# Patient Record
Sex: Male | Born: 1935 | Race: White | Hispanic: No | Marital: Married | Smoking: Former smoker
Health system: Southern US, Community
[De-identification: ages and names within clinical notes are randomized; demographics above are authoritative.]

## PROBLEM LIST (undated history)

## (undated) DIAGNOSIS — N400 Enlarged prostate without lower urinary tract symptoms: Secondary | ICD-10-CM

## (undated) DIAGNOSIS — K219 Gastro-esophageal reflux disease without esophagitis: Secondary | ICD-10-CM

## (undated) DIAGNOSIS — I350 Nonrheumatic aortic (valve) stenosis: Secondary | ICD-10-CM

## (undated) DIAGNOSIS — I2510 Atherosclerotic heart disease of native coronary artery without angina pectoris: Secondary | ICD-10-CM

## (undated) DIAGNOSIS — C7801 Secondary malignant neoplasm of right lung: Secondary | ICD-10-CM

## (undated) DIAGNOSIS — M1990 Unspecified osteoarthritis, unspecified site: Secondary | ICD-10-CM

## (undated) DIAGNOSIS — I10 Essential (primary) hypertension: Secondary | ICD-10-CM

## (undated) DIAGNOSIS — H15839 Staphyloma posticum, unspecified eye: Secondary | ICD-10-CM

## (undated) DIAGNOSIS — I739 Peripheral vascular disease, unspecified: Secondary | ICD-10-CM

## (undated) DIAGNOSIS — T4145XA Adverse effect of unspecified anesthetic, initial encounter: Secondary | ICD-10-CM

## (undated) DIAGNOSIS — C4490 Unspecified malignant neoplasm of skin, unspecified: Secondary | ICD-10-CM

## (undated) DIAGNOSIS — R413 Other amnesia: Secondary | ICD-10-CM

## (undated) DIAGNOSIS — R112 Nausea with vomiting, unspecified: Secondary | ICD-10-CM

## (undated) DIAGNOSIS — M109 Gout, unspecified: Secondary | ICD-10-CM

## (undated) DIAGNOSIS — C499 Malignant neoplasm of connective and soft tissue, unspecified: Secondary | ICD-10-CM

## (undated) DIAGNOSIS — I6529 Occlusion and stenosis of unspecified carotid artery: Secondary | ICD-10-CM

## (undated) DIAGNOSIS — H4423 Degenerative myopia, bilateral: Secondary | ICD-10-CM

## (undated) DIAGNOSIS — Z9889 Other specified postprocedural states: Secondary | ICD-10-CM

## (undated) DIAGNOSIS — T8859XA Other complications of anesthesia, initial encounter: Secondary | ICD-10-CM

## (undated) HISTORY — PX: VASECTOMY: SHX75

## (undated) HISTORY — PX: ROTATOR CUFF REPAIR: SHX139

## (undated) HISTORY — DX: Malignant neoplasm of connective and soft tissue, unspecified: C49.9

## (undated) HISTORY — PX: LUNG CANCER SURGERY: SHX702

## (undated) HISTORY — PX: FRACTURE SURGERY: SHX138

## (undated) HISTORY — PX: TENDON REPAIR: SHX5111

## (undated) HISTORY — PX: EYE SURGERY: SHX253

## (undated) HISTORY — PX: CORONARY ANGIOPLASTY: SHX604

---

## 2007-04-10 ENCOUNTER — Other Ambulatory Visit: Payer: Self-pay

## 2007-04-10 ENCOUNTER — Inpatient Hospital Stay: Payer: Self-pay | Admitting: Internal Medicine

## 2007-05-04 ENCOUNTER — Ambulatory Visit: Payer: Self-pay | Admitting: Unknown Physician Specialty

## 2007-05-08 ENCOUNTER — Ambulatory Visit: Payer: Self-pay | Admitting: Unknown Physician Specialty

## 2007-08-29 ENCOUNTER — Encounter: Payer: Self-pay | Admitting: Unknown Physician Specialty

## 2007-09-10 ENCOUNTER — Encounter: Payer: Self-pay | Admitting: Unknown Physician Specialty

## 2008-08-30 ENCOUNTER — Ambulatory Visit: Payer: Self-pay | Admitting: Gastroenterology

## 2009-04-28 ENCOUNTER — Ambulatory Visit: Payer: Self-pay | Admitting: Unknown Physician Specialty

## 2009-06-03 ENCOUNTER — Ambulatory Visit: Payer: Self-pay | Admitting: Unknown Physician Specialty

## 2009-07-14 ENCOUNTER — Encounter: Payer: Self-pay | Admitting: Unknown Physician Specialty

## 2009-08-09 ENCOUNTER — Encounter: Payer: Self-pay | Admitting: Unknown Physician Specialty

## 2009-09-09 ENCOUNTER — Encounter: Payer: Self-pay | Admitting: Unknown Physician Specialty

## 2009-10-07 ENCOUNTER — Encounter: Payer: Self-pay | Admitting: Unknown Physician Specialty

## 2009-11-07 ENCOUNTER — Encounter: Payer: Self-pay | Admitting: Unknown Physician Specialty

## 2009-12-07 ENCOUNTER — Encounter: Payer: Self-pay | Admitting: Unknown Physician Specialty

## 2011-08-19 ENCOUNTER — Ambulatory Visit: Payer: Self-pay | Admitting: Internal Medicine

## 2011-08-30 ENCOUNTER — Ambulatory Visit: Payer: Self-pay | Admitting: Internal Medicine

## 2011-10-13 DIAGNOSIS — I2510 Atherosclerotic heart disease of native coronary artery without angina pectoris: Secondary | ICD-10-CM | POA: Insufficient documentation

## 2011-10-13 DIAGNOSIS — E785 Hyperlipidemia, unspecified: Secondary | ICD-10-CM | POA: Insufficient documentation

## 2011-10-13 DIAGNOSIS — I350 Nonrheumatic aortic (valve) stenosis: Secondary | ICD-10-CM | POA: Insufficient documentation

## 2011-10-13 DIAGNOSIS — I10 Essential (primary) hypertension: Secondary | ICD-10-CM | POA: Insufficient documentation

## 2011-10-13 DIAGNOSIS — I739 Peripheral vascular disease, unspecified: Secondary | ICD-10-CM | POA: Insufficient documentation

## 2011-12-14 ENCOUNTER — Ambulatory Visit: Payer: Self-pay | Admitting: Gastroenterology

## 2011-12-16 LAB — PATHOLOGY REPORT

## 2013-06-13 ENCOUNTER — Inpatient Hospital Stay: Payer: Self-pay | Admitting: Internal Medicine

## 2013-06-13 LAB — COMPREHENSIVE METABOLIC PANEL
Albumin: 4.2 g/dL (ref 3.4–5.0)
Alkaline Phosphatase: 106 U/L (ref 50–136)
Anion Gap: 5 — ABNORMAL LOW (ref 7–16)
BUN: 23 mg/dL — ABNORMAL HIGH (ref 7–18)
Calcium, Total: 9.6 mg/dL (ref 8.5–10.1)
Chloride: 105 mmol/L (ref 98–107)
Co2: 28 mmol/L (ref 21–32)
Creatinine: 0.94 mg/dL (ref 0.60–1.30)
EGFR (African American): >60
EGFR (Non-African Amer.): >60
Glucose: 83 mg/dL (ref 65–99)
Osmolality: 279 (ref 275–301)
Potassium: 3.4 mmol/L — ABNORMAL LOW (ref 3.5–5.1)
SGOT(AST): 33 U/L (ref 15–37)
SGPT (ALT): 34 U/L (ref 12–78)
Sodium: 138 mmol/L (ref 136–145)
Total Protein: 7.8 g/dL (ref 6.4–8.2)

## 2013-06-13 LAB — TSH: Thyroid Stimulating Horm: 2.78 u[IU]/mL

## 2013-06-13 LAB — CBC WITH DIFFERENTIAL/PLATELET
Basophil #: 0.1 10*3/uL (ref 0.0–0.1)
Basophil %: 0.9 %
Eosinophil %: 6 %
HGB: 14.4 g/dL (ref 13.0–18.0)
Lymphocyte #: 2.5 10*3/uL (ref 1.0–3.6)
Lymphocyte %: 29 %
MCH: 32.5 pg (ref 26.0–34.0)
Monocyte #: 0.9 x10 3/mm (ref 0.2–1.0)
Monocyte %: 10.1 %
Neutrophil #: 4.6 10*3/uL (ref 1.4–6.5)
Neutrophil %: 54 %
RBC: 4.44 10*6/uL (ref 4.40–5.90)
RDW: 13.7 % (ref 11.5–14.5)
WBC: 8.5 10*3/uL (ref 3.8–10.6)

## 2013-06-13 LAB — LIPID PANEL
HDL Cholesterol: 48 mg/dL (ref 40–60)
Triglycerides: 162 mg/dL (ref 0–200)

## 2013-06-13 LAB — SEDIMENTATION RATE: Erythrocyte Sed Rate: 8 mm/hr (ref 0–20)

## 2014-03-25 DIAGNOSIS — H4420 Degenerative myopia, unspecified eye: Secondary | ICD-10-CM | POA: Insufficient documentation

## 2014-03-26 DIAGNOSIS — I6529 Occlusion and stenosis of unspecified carotid artery: Secondary | ICD-10-CM | POA: Insufficient documentation

## 2014-04-01 ENCOUNTER — Ambulatory Visit: Payer: Self-pay | Admitting: Internal Medicine

## 2014-04-04 ENCOUNTER — Ambulatory Visit: Payer: Self-pay | Admitting: Cardiothoracic Surgery

## 2014-04-09 ENCOUNTER — Ambulatory Visit: Payer: Self-pay | Admitting: Cardiothoracic Surgery

## 2014-04-11 LAB — COMPREHENSIVE METABOLIC PANEL
AST: 23 U/L (ref 15–37)
Albumin: 3.9 g/dL (ref 3.4–5.0)
Alkaline Phosphatase: 101 U/L
Anion Gap: 9 (ref 7–16)
BUN: 28 mg/dL — ABNORMAL HIGH (ref 7–18)
Bilirubin,Total: 0.7 mg/dL (ref 0.2–1.0)
CREATININE: 1.12 mg/dL (ref 0.60–1.30)
Calcium, Total: 9.3 mg/dL (ref 8.5–10.1)
Chloride: 105 mmol/L (ref 98–107)
Co2: 26 mmol/L (ref 21–32)
EGFR (African American): >60
EGFR (Non-African Amer.): >60
Glucose: 95 mg/dL (ref 65–99)
Osmolality: 285 (ref 275–301)
Potassium: 3.9 mmol/L (ref 3.5–5.1)
SGPT (ALT): 26 U/L
Sodium: 140 mmol/L (ref 136–145)
Total Protein: 7.5 g/dL (ref 6.4–8.2)

## 2014-04-11 LAB — CBC CANCER CENTER
Basophil #: 0.1 x10 3/mm (ref 0.0–0.1)
Basophil %: 0.8 %
EOS ABS: 0.5 x10 3/mm (ref 0.0–0.7)
Eosinophil %: 4.8 %
HCT: 39.4 % — ABNORMAL LOW (ref 40.0–52.0)
HGB: 13.2 g/dL (ref 13.0–18.0)
LYMPHS PCT: 23.1 %
Lymphocyte #: 2.5 x10 3/mm (ref 1.0–3.6)
MCH: 32.1 pg (ref 26.0–34.0)
MCHC: 33.5 g/dL (ref 32.0–36.0)
MCV: 96 fL (ref 80–100)
MONOS PCT: 10.2 %
Monocyte #: 1.1 x10 3/mm — ABNORMAL HIGH (ref 0.2–1.0)
NEUTROS PCT: 61.1 %
Neutrophil #: 6.6 x10 3/mm — ABNORMAL HIGH (ref 1.4–6.5)
PLATELETS: 247 x10 3/mm (ref 150–440)
RBC: 4.12 10*6/uL — ABNORMAL LOW (ref 4.40–5.90)
RDW: 13.5 % (ref 11.5–14.5)
WBC: 10.8 x10 3/mm — ABNORMAL HIGH (ref 3.8–10.6)

## 2014-04-11 LAB — PROTIME-INR
INR: 1
PROTHROMBIN TIME: 13.5 s (ref 11.5–14.7)

## 2014-04-11 LAB — APTT: Activated PTT: 27.6 secs (ref 23.6–35.9)

## 2014-04-22 ENCOUNTER — Ambulatory Visit: Payer: Self-pay | Admitting: Cardiothoracic Surgery

## 2014-04-22 ENCOUNTER — Ambulatory Visit: Payer: Self-pay

## 2014-04-24 ENCOUNTER — Inpatient Hospital Stay: Payer: Self-pay

## 2014-04-25 LAB — BASIC METABOLIC PANEL
ANION GAP: 8 (ref 7–16)
BUN: 16 mg/dL (ref 7–18)
CALCIUM: 8.7 mg/dL (ref 8.5–10.1)
CHLORIDE: 101 mmol/L (ref 98–107)
CO2: 26 mmol/L (ref 21–32)
Creatinine: 0.96 mg/dL (ref 0.60–1.30)
EGFR (African American): >60
EGFR (Non-African Amer.): >60
Glucose: 141 mg/dL — ABNORMAL HIGH (ref 65–99)
OSMOLALITY: 274 (ref 275–301)
POTASSIUM: 3.3 mmol/L — AB (ref 3.5–5.1)
SODIUM: 135 mmol/L — AB (ref 136–145)

## 2014-04-25 LAB — CBC WITH DIFFERENTIAL/PLATELET
BASOS PCT: 0.1 %
Basophil #: 0 10*3/uL (ref 0.0–0.1)
EOS PCT: 0.1 %
Eosinophil #: 0 10*3/uL (ref 0.0–0.7)
HCT: 39 % — AB (ref 40.0–52.0)
HGB: 13.3 g/dL (ref 13.0–18.0)
Lymphocyte #: 1.4 10*3/uL (ref 1.0–3.6)
Lymphocyte %: 10.5 %
MCH: 32 pg (ref 26.0–34.0)
MCHC: 34 g/dL (ref 32.0–36.0)
MCV: 94 fL (ref 80–100)
MONO ABS: 0.9 x10 3/mm (ref 0.2–1.0)
Monocyte %: 7 %
NEUTROS ABS: 10.7 10*3/uL — AB (ref 1.4–6.5)
Neutrophil %: 82.3 %
Platelet: 216 10*3/uL (ref 150–440)
RBC: 4.14 10*6/uL — ABNORMAL LOW (ref 4.40–5.90)
RDW: 13.3 % (ref 11.5–14.5)
WBC: 13 10*3/uL — ABNORMAL HIGH (ref 3.8–10.6)

## 2014-04-26 LAB — BASIC METABOLIC PANEL
ANION GAP: 10 (ref 7–16)
BUN: 14 mg/dL (ref 7–18)
CALCIUM: 9.2 mg/dL (ref 8.5–10.1)
CO2: 29 mmol/L (ref 21–32)
Chloride: 95 mmol/L — ABNORMAL LOW (ref 98–107)
Creatinine: 0.96 mg/dL (ref 0.60–1.30)
GLUCOSE: 133 mg/dL — AB (ref 65–99)
OSMOLALITY: 271 (ref 275–301)
Potassium: 3.2 mmol/L — ABNORMAL LOW (ref 3.5–5.1)
Sodium: 134 mmol/L — ABNORMAL LOW (ref 136–145)

## 2014-04-26 LAB — HEMOGLOBIN: HGB: 14 g/dL (ref 13.0–18.0)

## 2014-04-27 LAB — HEMOGLOBIN: HGB: 12.2 g/dL — AB (ref 13.0–18.0)

## 2014-04-27 LAB — MAGNESIUM: Magnesium: 1.5 mg/dL — ABNORMAL LOW

## 2014-04-27 LAB — BASIC METABOLIC PANEL
Anion Gap: 9 (ref 7–16)
BUN: 16 mg/dL (ref 7–18)
CHLORIDE: 103 mmol/L (ref 98–107)
Calcium, Total: 8.4 mg/dL — ABNORMAL LOW (ref 8.5–10.1)
Co2: 26 mmol/L (ref 21–32)
Creatinine: 0.91 mg/dL (ref 0.60–1.30)
EGFR (African American): >60
EGFR (Non-African Amer.): >60
Glucose: 102 mg/dL — ABNORMAL HIGH (ref 65–99)
Osmolality: 277 (ref 275–301)
POTASSIUM: 3.4 mmol/L — AB (ref 3.5–5.1)
Sodium: 138 mmol/L (ref 136–145)

## 2014-04-29 LAB — BASIC METABOLIC PANEL
Anion Gap: 11 (ref 7–16)
BUN: 12 mg/dL (ref 7–18)
Calcium, Total: 7.9 mg/dL — ABNORMAL LOW (ref 8.5–10.1)
Chloride: 108 mmol/L — ABNORMAL HIGH (ref 98–107)
Co2: 23 mmol/L (ref 21–32)
Creatinine: 0.58 mg/dL — ABNORMAL LOW (ref 0.60–1.30)
Glucose: 87 mg/dL (ref 65–99)
OSMOLALITY: 282 (ref 275–301)
Potassium: 4.3 mmol/L (ref 3.5–5.1)
SODIUM: 142 mmol/L (ref 136–145)

## 2014-05-02 LAB — PATHOLOGY REPORT

## 2014-05-09 ENCOUNTER — Ambulatory Visit: Payer: Self-pay | Admitting: Cardiothoracic Surgery

## 2014-06-09 ENCOUNTER — Ambulatory Visit: Payer: Self-pay | Admitting: Cardiothoracic Surgery

## 2014-09-02 ENCOUNTER — Ambulatory Visit: Payer: Self-pay | Admitting: Oncology

## 2014-09-05 ENCOUNTER — Ambulatory Visit: Payer: Self-pay | Admitting: Oncology

## 2014-09-05 LAB — COMPREHENSIVE METABOLIC PANEL
ALBUMIN: 3.5 g/dL (ref 3.4–5.0)
Alkaline Phosphatase: 114 U/L (ref 46–116)
Anion Gap: 11 (ref 7–16)
BUN: 21 mg/dL — ABNORMAL HIGH (ref 7–18)
Bilirubin,Total: 0.6 mg/dL (ref 0.2–1.0)
Calcium, Total: 8.5 mg/dL (ref 8.5–10.1)
Chloride: 103 mmol/L (ref 98–107)
Co2: 27 mmol/L (ref 21–32)
Creatinine: 1.04 mg/dL (ref 0.60–1.30)
EGFR (Non-African Amer.): >60
GLUCOSE: 87 mg/dL (ref 65–99)
OSMOLALITY: 284 (ref 275–301)
Potassium: 3.7 mmol/L (ref 3.5–5.1)
SGOT(AST): 18 U/L (ref 15–37)
SGPT (ALT): 29 U/L (ref 14–63)
SODIUM: 141 mmol/L (ref 136–145)
Total Protein: 7 g/dL (ref 6.4–8.2)

## 2014-09-05 LAB — CBC CANCER CENTER
BASOS ABS: 0.1 x10 3/mm (ref 0.0–0.1)
Basophil %: 0.8 %
EOS ABS: 0.6 x10 3/mm (ref 0.0–0.7)
Eosinophil %: 6 %
HCT: 40.6 % (ref 40.0–52.0)
HGB: 13.7 g/dL (ref 13.0–18.0)
LYMPHS PCT: 21.8 %
Lymphocyte #: 2.2 x10 3/mm (ref 1.0–3.6)
MCH: 30.9 pg (ref 26.0–34.0)
MCHC: 33.9 g/dL (ref 32.0–36.0)
MCV: 91 fL (ref 80–100)
Monocyte #: 0.8 x10 3/mm (ref 0.2–1.0)
Monocyte %: 7.8 %
NEUTROS ABS: 6.5 x10 3/mm (ref 1.4–6.5)
Neutrophil %: 63.6 %
Platelet: 238 x10 3/mm (ref 150–440)
RBC: 4.44 10*6/uL (ref 4.40–5.90)
RDW: 14.3 % (ref 11.5–14.5)
WBC: 10.2 x10 3/mm (ref 3.8–10.6)

## 2014-09-05 LAB — CREATININE, SERUM: Creatine, Serum: 1.04

## 2014-09-09 ENCOUNTER — Ambulatory Visit: Payer: Self-pay | Admitting: Oncology

## 2014-09-10 DIAGNOSIS — C7800 Secondary malignant neoplasm of unspecified lung: Secondary | ICD-10-CM | POA: Insufficient documentation

## 2014-11-29 NOTE — Consult Note (Signed)
Referring Physician:  Rusty Aus   Primary Care Physician:  Merdis Delay, [address], [place], [place] [address], [place] [phone]  Reason for Consult: Admit Date: 13-Jun-2013  Chief Complaint: stroke  Reason for Consult: CVA   History of Present Illness: History of Present Illness:   80 yo RHD Maynard presents to [facility] after having 10 days of uncoordination after waking up the other day.  Pt denies any focal weakness, numbness or vision changes.  After 10 days, it has not gotten any better.  He denies vertigo or N/V.  ROS:  General denies complaints   HEENT no complaints   Lungs no complaints   Cardiac no complaints   GI no complaints   GU no complaints   Musculoskeletal no complaints   Extremities no complaints   Skin no complaints   Neuro no complaints   Endocrine no complaints   Psych no complaints   Past Medical/Surgical Hx:  Stent, Cardiac:   Hypertension:   Past Medical/ Surgical Hx:  Past Medical History HTN, HLD, CAD   Past Surgical History stents   Home Medications: Medication Instructions Last Modified Date/Time  Percocet 5/325 oral tablet 1 - 2 tabs orally every 3 to 4 hours  26-Feb-11 11:40  Align 4 mg oral capsule 1 cap(s) orally once a day  22-Oct-10 11:15  ibuprofen 200 mg oral tablet 2 tab(s)   as needed   22-Oct-10 11:14  carvedilol 6.25 mg oral tablet 1 tab(s) orally in the morning and at bedtime  22-Oct-10 11:14  hydrochlorothiazide 25 mg oral tablet 1 tab(s) orally once a day (in the morning)  22-Oct-10 11:13  Viagra 50 mg oral tablet 1 tab(s) orally  as needed   26-Feb-11 11:40  Nexium 40 mg oral delayed release capsule 1 cap(s) orally once a day (in the morning)  22-Oct-10 11:12  allopurinol 300 mg oral tablet 1 tab(s) orally once a day (in the morning)  22-Oct-10 11:03  Lipitor 40 mg oral tablet 1 tab(s) orally once a day (at bedtime)  26-Feb-11 11:40  Altace 10 mg oral capsule 1 cap(s) orally once a day (in  the morning)  22-Oct-10 11:02  Plavix 75 mg oral tablet 1 tab(s) orally once a day (in the morning)  26-Feb-11 11:40  Norvasc 10 mg oral tablet 1 tab(s) orally once a day (in the morning)  22-Oct-10 11:01  aspirin 81 mg oral enteric coated tablet 1 tab(s) orally once a day  22-Oct-10 11:01   Allergies:  No Known Allergies:   Allergies:  Allergies NKDA   Social/Family History: Employment Status: retired  Lives With: alone  Living Arrangements: house  Social History: no tob, no EtOH, no illicts  Family History: + cAD, no stroke   Vital Signs: **Vital Signs.:   06-Nov-14 12:31  Vital Signs Type Routine  Temperature Temperature (F) 98.2  Celsius 36.7  Temperature Source oral  Pulse Pulse 55  Systolic BP Systolic BP 818  Diastolic BP (mmHg) Diastolic BP (mmHg) 69  Mean BP 90  Pulse Ox % Pulse Ox % 95  Pulse Ox Activity Level  At rest  Oxygen Delivery Room Air/ 21 %   Physical Exam: General: thin, NAD  HEENT: normocephalic, sclera nonicteric, oropharynx clear  Neck: supple, no JVD, no bruits  Chest: CTA B, no wheezing  Cardiac: RRR, no murmurs, no edema, 2+ pulses  Extremities: no C/C/E, FROM   Neurologic Exam: Mental Status: alert and oriented x 3, normal speech and  language, follows complex commands  Cranial Nerves: PERRLA, EOMI, nl VF, face symmetric, tongue midline, shoulder shrug equal  Motor Exam: 5/5 B except 4/5 L LE, nl tone  Deep Tendon Reflexes: 1+/4 B, babinski on L, down on R  Sensory Exam: pinprick, temperature, and vibration intact B  Coordination: FTN and HTS WNL   Lab Results: Thyroid:  05-Nov-14 18:40   Thyroid Stimulating Hormone 2.78 (0.45-4.50 (International Unit)  ----------------------- Pregnant patients have  different reference  ranges for TSH:  - - - - - - - - - -  Pregnant, first trimetser:  0.36 - 2.50 uIU/mL)  Hepatic:  05-Nov-14 18:40   Bilirubin, Total 0.8  Alkaline Phosphatase 106  SGPT (ALT) 34  SGOT (AST) 33  Total  Protein, Serum 7.8  Albumin, Serum 4.2  Routine Chem:  05-Nov-14 18:40   Glucose, Serum 83  BUN  23  Creatinine (comp) 0.94  Sodium, Serum 138  Potassium, Serum  3.4  Chloride, Serum 105  CO2, Serum 28  Calcium (Total), Serum 9.6  Osmolality (calc) 279  eGFR (African American) >60  eGFR (Non-African American) >60 (eGFR values <92m/min/1.73 m2 may be an indication of chronic kidney disease (CKD). Calculated eGFR is useful in patients with stable renal function. The eGFR calculation will not be reliable in acutely ill patients when serum creatinine is changing rapidly. It is not useful in  patients on dialysis. The eGFR calculation may not be applicable to patients at the low and high extremes of body sizes, pregnant women, and vegetarians.)  Anion Gap  5  Cholesterol, Serum 160  Triglycerides, Serum 162  HDL (INHOUSE) 48  VLDL Cholesterol Calculated 32  LDL Cholesterol Calculated 80 (Result(s) reported on 13 Jun 2013 at 08:09PM.)  Routine Hem:  05-Nov-14 18:40   WBC (CBC) 8.5  RBC (CBC) 4.44  Hemoglobin (CBC) 14.4  Hematocrit (CBC) 41.4  Platelet Count (CBC) 219  MCV 93  MCH 32.5  MCHC 34.8  RDW 13.7  Neutrophil % 54.0  Lymphocyte % 29.0  Monocyte % 10.1  Eosinophil % 6.0  Basophil % 0.9  Neutrophil # 4.6  Lymphocyte # 2.5  Monocyte # 0.9  Eosinophil # 0.5  Basophil # 0.1 (Result(s) reported on 13 Jun 2013 at 07:52PM.)  Erythrocyte Sed Rate 8 (Result(s) reported on 13 Jun 2013 at 08:05PM.)   Radiology Results: [place]    06-Nov-14 09:25, [place] Doppler Bilateral  [place] Bilateral   REASON FOR EXAM:    cva  COMMENTS:       PROCEDURE: [place] - [place]OT[place] DOPPLER BILATERAL  - Jun 14 2013  9:25AM     CLINICAL DATA:  Stroke, syncope    EXAM:  BILATERAL CAROTID DUPLEX ULTRASOUND    TECHNIQUE:  GPearline Cablesscale imaging, color Doppler and duplex ultrasound were  performed of bilateral carotid and vertebral arteries in the neck.    COMPARISON:   08/19/2011  FINDINGS:  Criteria: Quantification of carotid stenosis is based on velocity  parameters that correlate the residual internal carotid diameter  with NASCET-based stenosis levels, using the diameter of the distal  internal carotid lumen as the denominator for stenosis measurement.    The following velocity measurements were obtained:    RIGHT    ICA:  181/29 cm/sec    CCA:  788/8cm/sec    SYSTOLIC ICA/CCA RATIO:  29.16 DIASTOLIC ICA/CCA RATIO:  39.45   ECA:  155 cm/sec    LEFT    ICA:  121/15 cm/sec    CCA:  993/57 cm/sec    SYSTOLIC ICA/CCA RATIO:  0.17    DIASTOLIC ICA/CCA RATIO:  7.93    ECA:  183 cm/sec  RIGHT CAROTID ARTERY: Mild intimal thickening right CCA. Combination  of hypoechoic and calcified shadowing plaque at right carotid bulb  extending into proximal right ICA, associated with mildly turbulent  blood flow on color Doppler imaging and spectral broadening on  waveform analysis. High velocity jet is present in proximal right  ICA.    RIGHT VERTEBRAL ARTERY:  Patent, antegrade    LEFT CAROTID ARTERY: Tortuous left carotid system. Noncalcified  plaque left CCA. Calcified plaque at left carotid bulb into proximal  left ECA and ICA. Associated turbulent blood flow on color Doppler  imaging with spectral broadening on waveform analysis. Patient  intermittently arhythmic. No high velocity jets.  LEFT VERTEBRAL ARTERY:  Patent, antegrade.     IMPRESSION:  Plaque formation primarily at the carotid bifurcations bilaterally.    Velocity measurements at the left ICA correspond to a less than 50%  diameter stenosis.    Velocity measurements at the right ICA correspond to a 50-69%  diameter stenosis.    When compared to the previous exam, right ICA velocity has increased  suggesting progression of stenosis.    Patient intermittently arrhythmic.  Electronically Signed    By: Lavonia Dana Maynard.D.    On: 06/14/2013 09:33         Verified By: Burnetta Sabin, Maynard.D.,  CT:    05-Nov-14 18:Jorge, CT Head Without Contrast  CT Head Without Contrast   REASON FOR EXAM:    CVA  COMMENTS:       PROCEDURE: CT  - CT HEAD WITHOUT CONTRAST  - Jun 13 2013  6:37PM     CLINICAL DATA:  Dizziness    EXAM:  CT HEAD WITHOUT CONTRAST    TECHNIQUE:  Contiguous axial images were obtained from the base of the skull  through the vertex without intravenous contrast.    COMPARISON:  08/30/2011  FINDINGS:  No hemorrhage, infarct, mass, or hydrocephalus. Diffuse atrophy. No  hydrocephalus. Calvarium intact.     IMPRESSION:  No acute findings      Electronically Signed    By: Skipper Cliche Maynard.D.    On: 06/13/2013 18:38         Verified By: Rachael Fee, Maynard.D.,   Radiology Impression: Radiology Impression: CT of head personally reviewed by me and shows moderate white matter changes   Impression/Recommendations: Recommendations:   prior notes reviewed by me reviewed by me   Probable R hemispheric lacunar infarct-  etiology is small vessel disease from uncontrolled HTN;  good prognosis for recovery overall Hypertension-  not controlled MRI brain pending change ASA to plavix 75m daily continue statin at current dose needs better BP control with goal < 30/80 will sign out MRI to on-call Neuro  Electronic Signatures: SJamison Neighbor(MD)  (Signed [phone])  Authored: REFERRING PHYSICIAN, Primary Care Physician, Consult, History of Present Illness, Review of Systems, PAST MEDICAL/SURGICAL HISTORY, HOME MEDICATIONS, ALLERGIES, Social/Family History, NURSING VITAL SIGNS, Physical Exam-, LAB RESULTS, RADIOLOGY RESULTS, Recommendations   Last Updated: 06-Nov-14 13:15 by SJamison Neighbor(MD)

## 2014-11-29 NOTE — H&P (Signed)
PATIENT NAME:  Jorge Maynard, Jorge Maynard MR#:  [id number] DATE OF BIRTH:  1935/03/10  DATE OF ADMISSION:  06/13/2013  HISTORY OF PRESENT ILLNESS:  A 80 year old male who presents with acute ataxia and right greater than left leg weakness. The patient notes that symptoms began approximately 10 days ago but were slight. He felt like a bad equilibrium issue, felt kind of lightheaded. No chest pain. No tachycardia through this whole period. No arrhythmic-type feeling. Today, while playing golf, he had to stop after a few holes because his balance got so bad. He felt like he was going to fall over. He purposefully was walking and looking at his feet while he walks, always tends to fall toward the left when he is walking. He came to the office with these symptoms with clear neurologic deficits in the right leg and profound ataxia and will be admitted for an acute CVA.    PAST MEDICAL HISTORY:  Medical illnesses:  Coronary artery disease, stent x 2. Gout, hypertension, diverticulitis surgery. Stent x 2 in 2005.  Prostate cancer biopsy 2002, right rotator cuff.   ALLERGIES: None.   MEDICATIONS:  Allopurinol 300 mg daily, Altace 10 mg b.i.d., aspirin 81 mg daily, carvedilol 12.5 mg b.i.d., Lipitor 40 mg daily, Norvasc 10 mg daily, Nexium 40 mg daily, chlorthalidone 25 mg daily, Detrol 2 mg b.i.d., diclofenac 50 mg twice a day p.r.n. arthritis, Viagra p.r.n.   SOCIAL HISTORY: Married, retired.   FAMILY HISTORY: Noncontributory.   REVIEW OF SYSTEMS: As above, otherwise negative.   PHYSICAL EXAMINATION: VITAL SIGNS: Blood pressure 142/72, pulse 60 and regular, weight 167.  HEENT: Bilateral cranial nerves intact. Normal TMs. Normal oropharynx.  NECK: Good range of motion.  LUNGS: Clear.  HEART: Regular rate and rhythm, 2/6 systolic murmur at the apex.  ABDOMEN: Soft and nontender.  EXTREMITIES: Good peripheral pulses. No edema.  NEUROLOGIC: Profound ataxic gait, cannot walk at all in a straight line. Falls to the  left. Abnormal Romberg. Hand grips normal.   ASSESSMENT AND PLAN: 1.  Acute neurologic changes consistent with probable cerebellar cerebrovascular accident.  Plan brain CT then brain MRI. Increase aspirin to 325 mg daily. EKG shows normal sinus rhythm without any atrial fibrillation. Doubt embolic phenomenon. He did have CTA of his carotids done 2 years ago, which showed 50% blockages bilaterally. We will get neurology opinion,  recheck carotids, echocardiogram.  2.  Hypertension. Continue medications.  3.  Gout. Allopurinol.   ____________________________ Rusty Aus, MD mfm:cs D: 06/13/2013 17:46:00 ET T: 06/13/2013 18:27:30 ET JOB#: [id number]  cc: Rusty Aus, MD, <Dictator> Rusty Aus MD ELECTRONICALLY SIGNED 06/14/2013 17:29

## 2014-11-29 NOTE — Discharge Summary (Signed)
PATIENT NAME:  Jorge Maynard, Jorge Maynard MR#:  [id number] DATE OF BIRTH:  1935/07/03  DATE OF ADMISSION:  06/13/2013 DATE OF DISCHARGE: 06/15/2013   DISCHARGE DIAGNOSES:  1.  Transient ischemic attack with ataxia.  2.  Hypertension.  3.  Coronary artery disease.  4.  Gout.  5.  Carotid stenosis.   DISCHARGE MEDICATIONS: Allopurinol 300 mg daily, Altace 10 mg b.i.d., aspirin 81 mg daily, Plavix 75 mg daily, carvedilol 6.5 mg b.i.d., Lipitor 40 mg daily, Norvasc 10 mg daily, Nexium 40 mg daily and hydrochlorothiazide 25 mg daily.   REASON FOR ADMISSION: A 80 year old male, who presents with ataxia. Please see H and P for HPI, past medical history and physical exam.   HOSPITAL COURSE: The patient was admitted. Carotid Doppler showed 50% to 60% partial stenosis consistent with his prior CTA of a year ago. MRI showed chronic ischemia. Echocardiogram was unremarkable. The patient's ataxia did improve. It was thought that more than likely, from a neurology aspect, that this was small vessel ischemic disease creating this in the brain. Plavix was added to his aspirin. Ultimately, he may need physical therapy, but at this point, he will do his home therapy. He has no real issues of proprioception or clear evidence of polyneuropathy. Follow up with Dr. Sabra Heck in 1 week. Will cut his Coreg in half as he was getting some orthostatic dizziness.  ____________________________ Rusty Aus, MD mfm:aw D: 06/15/2013 07:59:45 ET T: 06/15/2013 08:23:45 ET JOB#: [id number]  cc: Rusty Aus, MD, <Dictator> Rusty Aus MD ELECTRONICALLY SIGNED 06/18/2013 8:07

## 2014-11-30 NOTE — Consult Note (Signed)
PATIENT NAME:  Jorge Maynard, Jorge Maynard MR#:  [id number] DATE OF BIRTH:  November 28, 1934  DATE OF NOTE:  04/11/2014  Dear Dr. Sabra Heck:  Thank you very much for letting me see Jorge Maynard back in the office today. He did have a chest CT scan made, which revealed a 1.5-cm right upper lobe nodule. There was no evidence of any other disease process outside of the lung. He had pulmonary function studies done. I reviewed those with Dr. Mortimer Fries and those are normal. His FEV1 is 3.02 or 111% of normal and his DLCO is 103% of normal.   I did review with him the results of the CT, which showed extensive cardiovascular disease.   PHYSICAL EXAMINATION:  HEART: He does have a pretty loud systolic murmur. He also has bilateral carotid bruits.   LUNGS: Were clear with some bibasilar dry rales.   We were able to review some of the information from [facility]. He does have moderate aortic stenosis. His ejection fraction was normal. He does have bilateral carotid artery disease. The disease on the right is upwards of 60 % to 70% and on the left is only 30%. As you know, he does take aspirin and Plavix and we need to stop that. I had a long discussion with him today regarding the options. He understands that surgical intervention offers the best chance of cure for early stage lung cancer. I reviewed with him the options of radiation therapy including the results of radiation. I discussed with him the options of thoracoscopy and thoracotomy as well as wedge resection and lobectomy. Given his age of 56 years and his underlying cardiovascular disease, he would like to avoid as many risks as possible and would prefer a wedge resection over a lobectomy. I explained to him that this would depend on what we were able to see at the time of surgery. I would like to offer him a thoracotomy with wedge resection as our preferred plan of treatment. He understands that a lobectomy may be required based upon the results of the anatomy. In  addition, I want to get a PET scan. I told him I would call him with those results. In addition, I would like to get your opinion regarding his cardiovascular status prior to surgery. We will make an appointment for him to come back and see you in follow-up.   Thank you very much for allowing me to participate in his care.  ____________________________ Jorge Maynard. Jorge Bi, MD teo:lr D: 04/11/2014 16:30:00 ET T: 04/11/2014 20:39:03 ET JOB#: [id number]  cc: Christia Reading E. Jorge Bi, MD, <Dictator> Louis Matte MD ELECTRONICALLY SIGNED 04/22/2014 8:06

## 2014-11-30 NOTE — Discharge Summary (Signed)
PATIENT NAME:  Jorge Maynard, VORA MR#:  [id number] DATE OF BIRTH:  [birth year]  DATE OF ADMISSION:  04/24/2014 DATE OF DISCHARGE:  04/29/2014  OPERATION PERFORMED: Right thoracotomy with right upper lobe wedge resection.   DISCHARGE DIAGNOSIS: Metastatic sarcoma, right upper lobe.   HOSPITAL COURSE: Mr. Benjimin Hadden is a 80 year old gentleman who presented with an enlarging right upper lobe mass. He was apprised of the indications and risks of the above-named procedure. He was brought into the hospital on September 16th where he underwent a right thoracotomy and wedge resection of a single solitary mass in the right upper lobe. Frozen section and ultimately permanent section revealed this to be metastatic from a head and neck sarcoma. He had previously undergone a wide excision of a skin lesion and the 2 histologic features were similar on both the skin and the lung. The patient's postoperative course was essentially unremarkable. He ultimately had his chest tube removed and a follow-up chest x-ray revealed the lung to be fully expanded. He was discharged to home with a Jackson-Pratt in place. The Jackson-Pratt will be removed in the office. His wife was instructed on wound care and how to manage the Jackson-Pratt drain. He will follow up with Dr. Genevive Bi in 1 week. At the time of discharge, his medications included Plavix, ramipril, allopurinol, aspirin, amlodipine, carvedilol, Nexium, chlorthalidone, Viagra, Norco 5/325 and Colace.  ____________________________ Lew Dawes. Genevive Bi, MD teo:sb D: 05/13/2014 16:46:40 ET T: 05/13/2014 17:11:09 ET JOB#: [id number]  cc: Lew Dawes. Genevive Bi, MD, <Dictator> Louis Matte MD ELECTRONICALLY SIGNED 05/28/2014 10:54

## 2014-11-30 NOTE — Op Note (Signed)
PATIENT NAME:  Jorge Maynard, Jorge Maynard MR#:  [id number] DATE OF BIRTH:  1934/08/25  DATE OF PROCEDURE:  04/24/2014  SURGEON: Christia Reading E. Genevive Bi, MD.   ASSISTANT: Bronson Ing, MD and Lezlie Lye, [place] student.   PREOPERATIVE DIAGNOSIS: Right upper lobe solitary lung nodule.   POSTOPERATIVE DIAGNOSIS:  Right upper lobe solitary lung nodule.   OPERATION PERFORMED:  1. Muscle-sparing right thoracotomy with wedge resection right upper lobe mass with frozen section consistent with spindle neoplasm.  2. Preoperative bronchoscopy to assess endobronchial anatomy.  3. Insertion of right common femoral arterial line.   INDICATIONS FOR PROCEDURE: Jorge Maynard is a 80 year old gentleman who was recently identified as having a right upper lobe PET positive lesion. The lesion appeared rounded with slightly spiculated borders and it was felt that this was most consistent with a malignancy. He was offered the above-named operation. An extensive discussion was had with the patient and his family regarding the indications and risks of surgery including the risks of bleeding, infection, air leak, and death. Other options including radiation therapy, wedge resection, and surgical intervention for lobectomy were discussed. The patient gave his informed consent.   DESCRIPTION OF PROCEDURE: The patient was brought to the operating suite and placed in the supine position. General endotracheal anesthesia was given with a double-lumen tube. Preoperative bronchoscopy was carried out and was normal to the subsegmental level bilaterally.   The right groin was prepped and draped in the usual sterile fashion. Using a Seldinger technique the right common femoral artery was percutaneously catheterized and a catheter was inserted over a wire into the right common femoral artery. Appropriate monitoring lines were then inserted and the catheter flushed and irrigated nicely.   The patient was then turned for a right muscle-sparing thoracotomy. A  right muscle-sparing thoracotomy was created and the chest was entered through approximately the 5th or 6th intercostal space. The lung was deflated and retractor was placed. We could easily palpate the lung and felt the lesion in the right upper lobe. This had a green mucoid appearance to it.  The tumor was excised from the lung with at least 1 cm gross margins. The tumor was then passed off the field and a frozen section was performed. Dr. Bryan Lemma returned the frozen section diagnosis of a spindle neoplasm not otherwise specified. There was some atypia and some evidence of vascular invasion, but this did not appear to be an obvious lung cancer. There was some concern that this may represent metastatic disease. We then turned our attention to the subcarinal pretracheal areas where several small lymph nodes were resected. These were sent for permanent section. Discussion was then had whether we should perform a lobectomy or stop at the wedge resection. I elected to stop at the wedge as the pathology was unclear. He does also have a history of prior surgical resection of skin appendage tumors. At this point we then placed a single 47 French chest tube to the apex and brought it through a separate stab wound. The chest tube was secured with silk. The ribs were then reapproximated with number 2 Vicryl. The muscles of the chest wall were allowed to return to their normal anatomic position. The subcutaneous tissues were closed over a 19 [place] Blake drain and the skin was closed with staples. The patient tolerated the procedure well, was rolled in the supine position where he was extubated and taken to the recovery room in stable condition.    ____________________________ Lew Dawes Genevive Bi, MD teo:bu D:  04/24/2014 13:27:54 ET T: 04/24/2014 15:15:50 ET JOB#: [id number]  cc: Christia Reading E. Genevive Bi, MD, <Dictator> Rusty Aus, MD Martie Lee. Choksi, MD Dr. Bronson Ing  Louis Matte MD ELECTRONICALLY SIGNED 05/28/2014  10:54

## 2014-11-30 NOTE — Consult Note (Signed)
PATIENT NAME:  Jorge Maynard, Jorge Maynard MR#:  [id number] DATE OF BIRTH:  February 09, 1935  DATE OF CONSULTATION:  04/25/2014  REFERRING PHYSICIAN:  Nestor Lewandowsky, MD CONSULTING PHYSICIAN:  Rusty Aus, MD  HISTORY OF PRESENT ILLNESS: A 80 year old male who presents postoperatively for a right upper lobe lung lesion. He was found to have that right upper lobe lesion on CT scanning of his carotids. He had been asymptomatic. He had not been having any chest pain. He did well postoperatively and notes some nausea from the pain medication. No dyspnea, only pleuritic chest pain on that right side. The chest tube is draining serosanguineous fluid. Labs are stable, other than potassium 3.3. Initial pathology shows to be metastatic lesion from a 2 year prior skin cancer on his face. PET scan only lite up the right upper lobe lesion, otherwise negative.   PAST MEDICAL HISTORY: Medical Illnesses: coronary artery disease, gout, hypertension, diverticulitis, chronic microvascular disease.  Surgeries: Cardiac stent x2, prostate biopsy, right rotator cuff.   ALLERGIES: No known drug allergies.   MEDICATIONS: Plavix 75 mg daily, allopurinol 300 mg daily, Altace 10 mg b.i.d., aspirin 81 mg daily, carvedilol 6.25 mg b.i.d., Lipitor 40 mg at bedtime, Norvasc 10 mg daily, Nexium 40 mg daily, chlorthalidone 25 mg daily.   SOCIAL HISTORY: Married, 6 children, retired from [employer]. Enjoys golf. No smoking,   FAMILY HISTORY: Father died at age 10 of CHF and CVA. Sister with MS.  REVIEW OF SYSTEMS: As above, otherwise negative.   PHYSICAL EXAMINATION: VITAL SIGNS: Blood pressure 135/75, pulse 75 and regular.  HEENT: Pupils equal and reactive.  NECK: No thyromegaly or bruits.  LUNGS: Essentially clear, although diminished in the right upper lobe.  ABDOMEN: Good bowel sounds, soft and nontender.  EXTREMITIES: No edema.  NEUROLOGIC: Grossly nonfocal.   LABORATORY DATA: All within normal limits.  IMPRESSION: A 80 year old male  doing well postoperative, right upper lobe lesion. It is likely with the PET scan only showing the 1 lesion that this was a solitary metastasis. Certainly oncology down the road should follow this patient.   PLAN:  1.  Continue postoperative care.  2.  Monitor for any signs of coronary ischemia, none at this point.  3.  Resume hypertensive medications, blood pressure has done well. 4.  Replace potassium.   ____________________________ Rusty Aus, MD mfm:sb D: 04/25/2014 07:36:47 ET T: 04/25/2014 08:00:35 ET JOB#: [id number]  cc: Rusty Aus, MD, <Dictator> Lew Dawes. Genevive Bi, MD Elta Guadeloupe Roselee Culver MD ELECTRONICALLY SIGNED 04/25/2014 8:26

## 2014-12-27 ENCOUNTER — Other Ambulatory Visit: Payer: Self-pay | Admitting: *Deleted

## 2014-12-27 DIAGNOSIS — C499 Malignant neoplasm of connective and soft tissue, unspecified: Secondary | ICD-10-CM

## 2014-12-31 ENCOUNTER — Inpatient Hospital Stay: Payer: Medicare PPO | Attending: Oncology | Admitting: Oncology

## 2014-12-31 ENCOUNTER — Encounter: Payer: Self-pay | Admitting: Oncology

## 2014-12-31 ENCOUNTER — Ambulatory Visit
Admission: RE | Admit: 2014-12-31 | Discharge: 2014-12-31 | Disposition: A | Discharge status: Home / Self Care | Payer: Medicare PPO | Source: Ambulatory Visit | Attending: Oncology | Admitting: Oncology

## 2014-12-31 ENCOUNTER — Other Ambulatory Visit: Payer: Self-pay | Admitting: *Deleted

## 2014-12-31 ENCOUNTER — Inpatient Hospital Stay: Payer: Medicare PPO

## 2014-12-31 VITALS — HR 44 | Temp 95.9°F | Wt 166.9 lb

## 2014-12-31 DIAGNOSIS — C7800 Secondary malignant neoplasm of unspecified lung: Principal | ICD-10-CM

## 2014-12-31 DIAGNOSIS — D3501 Benign neoplasm of right adrenal gland: Secondary | ICD-10-CM | POA: Diagnosis not present

## 2014-12-31 DIAGNOSIS — D3502 Benign neoplasm of left adrenal gland: Secondary | ICD-10-CM | POA: Diagnosis not present

## 2014-12-31 DIAGNOSIS — R531 Weakness: Secondary | ICD-10-CM | POA: Diagnosis not present

## 2014-12-31 DIAGNOSIS — Z85828 Personal history of other malignant neoplasm of skin: Secondary | ICD-10-CM | POA: Insufficient documentation

## 2014-12-31 DIAGNOSIS — C499 Malignant neoplasm of connective and soft tissue, unspecified: Secondary | ICD-10-CM

## 2014-12-31 DIAGNOSIS — Z85118 Personal history of other malignant neoplasm of bronchus and lung: Secondary | ICD-10-CM | POA: Insufficient documentation

## 2014-12-31 DIAGNOSIS — R5383 Other fatigue: Secondary | ICD-10-CM

## 2014-12-31 DIAGNOSIS — Z08 Encounter for follow-up examination after completed treatment for malignant neoplasm: Secondary | ICD-10-CM | POA: Insufficient documentation

## 2014-12-31 DIAGNOSIS — Z79899 Other long term (current) drug therapy: Secondary | ICD-10-CM

## 2014-12-31 HISTORY — DX: Malignant neoplasm of connective and soft tissue, unspecified: C49.9

## 2014-12-31 LAB — CBC WITH DIFFERENTIAL/PLATELET
BASOS ABS: 0.1 10*3/uL (ref 0–0.1)
Basophils Relative: 1 %
EOS ABS: 0.6 10*3/uL (ref 0–0.7)
Eosinophils Relative: 6 %
HEMATOCRIT: 37.5 % — AB (ref 40.0–52.0)
Hemoglobin: 12.6 g/dL — ABNORMAL LOW (ref 13.0–18.0)
Lymphocytes Relative: 25 %
Lymphs Abs: 2.4 10*3/uL (ref 1.0–3.6)
MCH: 31.9 pg (ref 26.0–34.0)
MCHC: 33.7 g/dL (ref 32.0–36.0)
MCV: 94.6 fL (ref 80.0–100.0)
MONOS PCT: 10 %
Monocytes Absolute: 1 10*3/uL (ref 0.2–1.0)
Neutro Abs: 5.6 10*3/uL (ref 1.4–6.5)
Neutrophils Relative %: 58 %
PLATELETS: 207 10*3/uL (ref 150–440)
RBC: 3.96 MIL/uL — ABNORMAL LOW (ref 4.40–5.90)
RDW: 14.5 % (ref 11.5–14.5)
WBC: 9.6 10*3/uL (ref 3.8–10.6)

## 2014-12-31 LAB — COMPREHENSIVE METABOLIC PANEL
ALBUMIN: 3.9 g/dL (ref 3.5–5.0)
ALK PHOS: 95 U/L (ref 38–126)
ALT: 21 U/L (ref 17–63)
AST: 24 U/L (ref 15–41)
Anion gap: 7 (ref 5–15)
BUN: 23 mg/dL — ABNORMAL HIGH (ref 6–20)
CO2: 26 mmol/L (ref 22–32)
CREATININE: 1.04 mg/dL (ref 0.61–1.24)
Calcium: 8.8 mg/dL — ABNORMAL LOW (ref 8.9–10.3)
Chloride: 105 mmol/L (ref 101–111)
GFR calc Af Amer: >60 mL/min (ref >60)
GFR calc non Af Amer: >60 mL/min (ref >60)
Glucose, Bld: 97 mg/dL (ref 65–99)
POTASSIUM: 3.7 mmol/L (ref 3.5–5.1)
Sodium: 138 mmol/L (ref 135–145)
TOTAL PROTEIN: 6.8 g/dL (ref 6.5–8.1)
Total Bilirubin: 0.9 mg/dL (ref 0.3–1.2)

## 2014-12-31 NOTE — Progress Notes (Signed)
[facility] Progress Note-Oncology Follow Up  [Authored: 28-Jan-16 07:33]- for Visit: [id number], Complete, Entered, Signed in Full, General   Note Type Oncology Follow Up   HPI: Referred by Jorge Maynard.   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  Chief Complaint:  Historian Patient   Presenting Problem Patient is here for further evaluation regarding pulmonary nodules.  Patient offers no complaints today. Patient does have living will.   Negative Symptoms fever, chills, anorexia, weakness, nausea, vomiting, diarrhea, constipation, cough, shortness of breath, palpitations, burning with urination, urinary frequency, headache, numbness/tingling, back pain, hip pain, rash   Subjective: Chief Complaint/Diagnosis:   1.right upper lobe mass, PET positive, status post laser resection in September of 2015 Pleomorphic undifferentiated sarcoma favor metastases.  Margins are clear 2.status post right temporal MOH'S  surgery  in 2011.  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 is a 80 year old white male who was in his usual state of health until last week when he obtained a neck CT scan for followup of his bilateral carotid artery disease. On that scan they noticed that he had a right upper lobe mass that was not present 2 years prior when he had his similar neck x-rays obtained for evaluation of ataxia. Jorge Maynard states that he is in excellent physical shape. He works out 3 times a week in [employer] and plays golf. He does not notice any shortness of breath. He is able to walk up and down steps. He does state that over the course of the last several years he has noticed that he is not quite as strong as he used to be but he feels that relative to his peers he is in excellent physical shap patient  underwent biopsy with wedge resection and was consistent with pleomorphic undifferentiated sarcoma. patient had a previous surgeon he from the right temple area and was found to have similar sarcoma..  September 05, 2014 Patient is here for further follow-up regarding metastatic sarcoma to the lung status post resection no evidence of clinical disease.  Remains asymptomatic.  Had a repeat CT scan of the chest.  No cough or shortness of breath no chest pain Dec 31, 2014\patient came today for the follow-up.  Since last evaluation did not have any other new significant problem.  Patient had some basal cell and squamous cell removed from the face.  No cough or shortness of breath  Review of Systems:  Gen. status: Patient is alert oriented not in any acute distress  Lungs: No cough or shortness of breath or hemoptysis  Cardiovascular system: No chest pain.  No palpitation.  No paroxysmal nocturnal dyspnea. HEENT: No headache.  No hearing loss.  No ear pain.  No nosebleed or congestion.  No sore throat.  No difficulty swallowing Gastro intestinal system: No heartburn.  No nausea or vomiting.  No abdominal pain.  No diarrhea.  No constipation.  No rectal bleeding. Skin: No evidence of ecchymosis or rash. Neurological system: Denies any headache dizziness.   All other systems have been reviewed                                Allergies:  No Known Allergies:   Significant History/PMH:   Stent, Cardiac:    Hypertension:  Preventive Screening:  Has patient had any of the following test? Colonscopy   Last Colonoscopy: about 2 years ago   Smoking History: Smoking History quit 50 years ago.  PFSH: Comments: no significant family history of diabetes, colon cancer or any other malignancy or heart disease  Additional Past Medical and Surgical History: His past medical history is significant for a basal cell carcinoma of the nose as well as some type of skin malignancy requiring  Mohs surgery on his right temple. He states that the diagnosis was an unusual variant of a very aggressive tumor. Those details are not available to me at this time. He has also had some shoulder and elbow surgery as well as coronary artery stents.  the right temple biopsy specimen was obtained and was consistent with undifferentiated pleomorphic sarcoma   Home Medications: Medication Instructions Last Modified Date/Time  carvedilol 6.25 mg oral tablet 1 tab(s) orally 2 times a day 28-Jan-16 11:24  ramipril 10 mg oral capsule 1 cap(s) orally 2 times a day 28-Jan-16 11:24  allopurinol 300 mg oral tablet 1 tab(s) orally once a day 28-Jan-16 11:24  aspirin 81 mg oral tablet, chewable 1 tab(s) orally once a day 28-Jan-16 11:24  amLODIPine 10 mg oral tablet 1 tab(s) orally once a day 28-Jan-16 11:24  Plavix 75 mg oral tablet 1 tab(s) orally once a day (in the morning)  28-Jan-16 11:24  NexIUM OTC 20 mg oral delayed release capsule 1 cap(s) orally once a day (in the morning) 28-Jan-16 11:24  chlorthalidone 25 mg oral tablet 1 tab(s) orally once a day 28-Jan-16 11:24  Viagra  orally , As Needed 28-Jan-16 11:24     Physical Exam:  General: patient is alert and oried not in any ute distress  Eyes: pupils  are equal reacting to light.    No jaundice  palelooking sclera  Head, Ears, Nose,Throat: normal, no lesions or deformities  Neck, Thyroid: no thyroid tenderness, enlargement or nodule.  neck supple without massess or tenderness. no adenopathy. no carotid bruit.  Respiratory: no rales, rhonchi, retractions or wheezing  Cardiovascular: heart sounds are normal.oft systolic murmur.  Occasionally irregular heart some  Gastrointestinal: abdomen is soft.  Liver and spleen not palpable.  No ascites.  No tenderness.  Bowel sounds are normal  Musculoskeletal: no swelling  of the joint.  Lower extremity no swelling  Skin: no rashes, ulcers, or lesions  Neurological: higher functions are within normal  limit.  cranial nerves are intact  Muscle power tone within normal limit.  No focal signs.  No sensory deficit.  Lymphatics: no cervical, axillary, or inguinal lymphadenopathy     =======================================================================     CT Chest Without Contrast 02-Sep-2014 08:27:00: IMPRESSION:  1. Surgical changes from a partial right upper lobe lobectomy. No  findings for residual or recurrent tumor.  2. No mediastinal or hilar mass or adenopathy.  3. Stable 7 mm subpleural lymph node in the superior segment of the  right lower lobe adjacent to the major fissure.  4. Stable pulmonary scarring changes and areas bronchiectasis.  5. No new pulmonary lesions.  6. Bilateral adrenal gland adenomas.      Electronically Signed    By: Kalman Jewels M.D.    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  And diagnosis was considered as primary undifferentiated pleomorphic sarcoma of the righttemple skene metastases to lung Status post resection of the lung mass Stage IV NED  CT scan has been reviewed independently and there is no evidence of recurrent disease.  Patient remains stage IV status post resection NED. Clinically this has been stage is T1, N0, M1 tumor Plan:   .........    F4.  Advance Directive:  Advance Directive ([place]) yes   Do you want to revise or change your advance directive? No

## 2015-01-05 ENCOUNTER — Encounter: Payer: Self-pay | Admitting: Oncology

## 2015-01-07 ENCOUNTER — Ambulatory Visit: Payer: Medicare PPO | Admitting: Anesthesiology

## 2015-01-07 ENCOUNTER — Encounter: Payer: Self-pay | Admitting: Anesthesiology

## 2015-01-07 ENCOUNTER — Ambulatory Visit
Admission: RE | Admit: 2015-01-07 | Discharge: 2015-01-07 | Disposition: A | Discharge status: Home / Self Care | Payer: Medicare PPO | Source: Ambulatory Visit | Attending: Gastroenterology | Admitting: Gastroenterology

## 2015-01-07 ENCOUNTER — Encounter
Admission: RE | Disposition: A | Discharge status: Home / Self Care | Payer: Self-pay | Source: Ambulatory Visit | Attending: Gastroenterology

## 2015-01-07 DIAGNOSIS — Z8261 Family history of arthritis: Secondary | ICD-10-CM | POA: Diagnosis not present

## 2015-01-07 DIAGNOSIS — C7800 Secondary malignant neoplasm of unspecified lung: Secondary | ICD-10-CM | POA: Diagnosis not present

## 2015-01-07 DIAGNOSIS — D123 Benign neoplasm of transverse colon: Secondary | ICD-10-CM | POA: Insufficient documentation

## 2015-01-07 DIAGNOSIS — H4423 Degenerative myopia, bilateral: Secondary | ICD-10-CM | POA: Insufficient documentation

## 2015-01-07 DIAGNOSIS — I2510 Atherosclerotic heart disease of native coronary artery without angina pectoris: Secondary | ICD-10-CM | POA: Insufficient documentation

## 2015-01-07 DIAGNOSIS — Z7982 Long term (current) use of aspirin: Secondary | ICD-10-CM | POA: Diagnosis not present

## 2015-01-07 DIAGNOSIS — Z820 Family history of epilepsy and other diseases of the nervous system: Secondary | ICD-10-CM | POA: Insufficient documentation

## 2015-01-07 DIAGNOSIS — I739 Peripheral vascular disease, unspecified: Secondary | ICD-10-CM | POA: Diagnosis not present

## 2015-01-07 DIAGNOSIS — Z961 Presence of intraocular lens: Secondary | ICD-10-CM | POA: Insufficient documentation

## 2015-01-07 DIAGNOSIS — M109 Gout, unspecified: Secondary | ICD-10-CM | POA: Diagnosis not present

## 2015-01-07 DIAGNOSIS — Z8489 Family history of other specified conditions: Secondary | ICD-10-CM | POA: Diagnosis not present

## 2015-01-07 DIAGNOSIS — H43813 Vitreous degeneration, bilateral: Secondary | ICD-10-CM | POA: Diagnosis not present

## 2015-01-07 DIAGNOSIS — G7289 Other specified myopathies: Secondary | ICD-10-CM | POA: Diagnosis not present

## 2015-01-07 DIAGNOSIS — I252 Old myocardial infarction: Secondary | ICD-10-CM | POA: Insufficient documentation

## 2015-01-07 DIAGNOSIS — Z79899 Other long term (current) drug therapy: Secondary | ICD-10-CM | POA: Insufficient documentation

## 2015-01-07 DIAGNOSIS — Z87891 Personal history of nicotine dependence: Secondary | ICD-10-CM | POA: Insufficient documentation

## 2015-01-07 DIAGNOSIS — Z823 Family history of stroke: Secondary | ICD-10-CM | POA: Insufficient documentation

## 2015-01-07 DIAGNOSIS — I350 Nonrheumatic aortic (valve) stenosis: Secondary | ICD-10-CM | POA: Insufficient documentation

## 2015-01-07 DIAGNOSIS — Z86010 Personal history of colonic polyps: Secondary | ICD-10-CM | POA: Diagnosis present

## 2015-01-07 DIAGNOSIS — E785 Hyperlipidemia, unspecified: Secondary | ICD-10-CM | POA: Diagnosis not present

## 2015-01-07 DIAGNOSIS — I10 Essential (primary) hypertension: Secondary | ICD-10-CM | POA: Diagnosis not present

## 2015-01-07 DIAGNOSIS — I6529 Occlusion and stenosis of unspecified carotid artery: Secondary | ICD-10-CM | POA: Diagnosis not present

## 2015-01-07 DIAGNOSIS — D125 Benign neoplasm of sigmoid colon: Secondary | ICD-10-CM | POA: Insufficient documentation

## 2015-01-07 DIAGNOSIS — K5730 Diverticulosis of large intestine without perforation or abscess without bleeding: Secondary | ICD-10-CM | POA: Insufficient documentation

## 2015-01-07 DIAGNOSIS — Z85828 Personal history of other malignant neoplasm of skin: Secondary | ICD-10-CM | POA: Insufficient documentation

## 2015-01-07 DIAGNOSIS — K219 Gastro-esophageal reflux disease without esophagitis: Secondary | ICD-10-CM | POA: Diagnosis not present

## 2015-01-07 HISTORY — PX: COLONOSCOPY: SHX5424

## 2015-01-07 HISTORY — DX: Secondary malignant neoplasm of right lung: C78.01

## 2015-01-07 HISTORY — DX: Degenerative myopia, bilateral: H44.23

## 2015-01-07 HISTORY — DX: Occlusion and stenosis of unspecified carotid artery: I65.29

## 2015-01-07 HISTORY — DX: Unspecified malignant neoplasm of skin, unspecified: C44.90

## 2015-01-07 HISTORY — DX: Gout, unspecified: M10.9

## 2015-01-07 HISTORY — DX: Nonrheumatic aortic (valve) stenosis: I35.0

## 2015-01-07 HISTORY — DX: Gastro-esophageal reflux disease without esophagitis: K21.9

## 2015-01-07 HISTORY — DX: Staphyloma posticum, unspecified eye: H15.839

## 2015-01-07 HISTORY — DX: Essential (primary) hypertension: I10

## 2015-01-07 HISTORY — DX: Peripheral vascular disease, unspecified: I73.9

## 2015-01-07 HISTORY — DX: Atherosclerotic heart disease of native coronary artery without angina pectoris: I25.10

## 2015-01-07 SURGERY — COLONOSCOPY
Anesthesia: General

## 2015-01-07 MED ORDER — EPHEDRINE SULFATE 50 MG/ML IJ SOLN
INTRAMUSCULAR | Status: DC | PRN
  Administered 2015-01-07: 25 mg via INTRAVENOUS
  Administered 2015-01-07 (×2): 10 mg via INTRAVENOUS

## 2015-01-07 MED ORDER — PROPOFOL INFUSION 10 MG/ML OPTIME
INTRAVENOUS | Status: DC | PRN
  Administered 2015-01-07: 100 ug/kg/min via INTRAVENOUS

## 2015-01-07 MED ORDER — SODIUM CHLORIDE 0.9 % IV SOLN: INTRAVENOUS | Status: DC

## 2015-01-07 MED ORDER — SODIUM CHLORIDE 0.9 % IV SOLN
INTRAVENOUS | Status: DC
  Administered 2015-01-07: 1000 mL via INTRAVENOUS
  Administered 2015-01-07: 08:00:00 via INTRAVENOUS

## 2015-01-07 MED ORDER — PROPOFOL 10 MG/ML IV BOLUS
INTRAVENOUS | Status: DC | PRN
  Administered 2015-01-07: 50 mg via INTRAVENOUS

## 2015-01-07 NOTE — Anesthesia Postprocedure Evaluation (Signed)
  Anesthesia Post-op Note  Patient: Jorge Maynard  Procedure(s) Performed: Procedure(s): COLONOSCOPY (N/A)  Anesthesia type:General  Patient location: PACU  Post pain: Pain level controlled  Post assessment: Post-op Vital signs reviewed, Patient's Cardiovascular Status Stable, Respiratory Function Stable, Patent Airway and No signs of Nausea or vomiting  Post vital signs: Reviewed and stable  Last Vitals:  Filed Vitals:   01/07/15 0735  BP: 93/46  Pulse: 43  Temp: 36.3 C  Resp: 16    Level of consciousness: awake, alert  and patient cooperative  Complications: No apparent anesthesia complications

## 2015-01-07 NOTE — H&P (Signed)
Outpatient short stay form Pre-procedure 01/07/2015 8:13 AM Jorge Sails MD Primary Physician: Dr. Emily Filbert, Dr. Sheppard Coil, cardiology, [facility]  Reason for visit:  Personal history of adenomatous colon polyps  History of present illness:  Patient is a 80 year old Caucasian male residing as above. Full medical issues including 3 for lung cancer in September 2015 and coronary artery disease stents. He tolerated his prep well. He is held his anticoagulates for 7 days.    Current facility-administered medications:  .  0.9 %  sodium chloride infusion, , Intravenous, Continuous, Jorge Sails, MD, Last Rate: 50 mL/hr at 01/07/15 0755, 1,000 mL at 01/07/15 0755 .  0.9 %  sodium chloride infusion, , Intravenous, Continuous, Jorge Sails, MD  Prescriptions prior to admission  Medication Sig Dispense Refill Last Dose  . allopurinol (ZYLOPRIM) 300 MG tablet Take 300 mg by mouth daily.   Taking  . amLODipine (NORVASC) 10 MG tablet Take 10 mg by mouth daily.   01/07/2015 at [place]  . aspirin 81 MG tablet Take 81 mg by mouth daily.   Taking  . atorvastatin (LIPITOR) 80 MG tablet Take 80 mg by mouth daily.   Taking  . carvedilol (COREG) 6.25 MG tablet Take 6.25 mg by mouth 2 (two) times daily with a meal.   Taking  . chlorthalidone (HYGROTON) 25 MG tablet Take 12.5 mg by mouth 2 (two) times daily.   01/07/2015 at [place]  . clopidogrel (PLAVIX) 75 MG tablet Take 75 mg by mouth every morning.   Taking  . Esomeprazole Magnesium (NEXIUM 24HR PO) Take 22.3 mg by mouth daily.   Taking  . ibuprofen (ADVIL,MOTRIN) 200 MG tablet Take 400 mg by mouth every 6 (six) hours as needed for moderate pain.   01/07/2015 at [place]  . polyethylene glycol-electrolytes (NULYTELY/GOLYTELY) 420 G solution Take 4,000 mLs by mouth once.  0 Not Taking  . ramipril (ALTACE) 10 MG capsule Take 10 mg by mouth 2 (two) times daily.   Taking  . sildenafil (REVATIO) 20 MG tablet Take 20 mg by mouth as needed  (erectile dysfunction).    01/07/2015 at 0515     No Known Allergies   Past Medical History  Diagnosis Date  . Sarcoma 12/31/2014  . Hypertension   . Coronary artery disease   . Peripheral vascular disease   . GERD (gastroesophageal reflux disease)   . Gout   . Aortic stenosis   . Carotid stenosis   . Pathologic myopia, both eyes   . Posterior staphyloma   . Secondary sarcoma of right lung   . Malignant neoplasm of skin     Review of systems:      Physical Exam    Heart and lungs: Regular rate and rhythm systolic aortic murmur to auscultation.    HEENT: Normocephalic atraumatic    Other:     Pertinant exam for procedure: Soft nontender nondistended bowel sounds positive normoactive    Planned proceedures: Anoscopy and indicated procedures. I have discussed the risks benefits and complications of procedures to include not limited to bleeding, infection, perforation and the risk of sedation and the patient wishes to proceed.    Jorge Sails, MD Gastroenterology 01/07/2015  8:13 AM

## 2015-01-07 NOTE — Anesthesia Preprocedure Evaluation (Addendum)
Anesthesia Evaluation  Patient identified by MRN, date of birth, ID band  Reviewed: Allergy & Precautions, NPO status , Patient's Chart, lab work & pertinent test results, reviewed documented beta blocker date and time   Airway Mallampati: II  TM Distance: >3 FB     Dental   Pulmonary former smoker,          Cardiovascular hypertension, + CAD     Neuro/Psych    GI/Hepatic GERD-  ,  Endo/Other    Renal/GU      Musculoskeletal   Abdominal   Peds  Hematology   Anesthesia Other Findings Gout.  Reproductive/Obstetrics                            Anesthesia Physical Anesthesia Plan  ASA: III  Anesthesia Plan: General   Post-op Pain Management:    Induction: Intravenous  Airway Management Planned: Nasal Cannula  Additional Equipment:   Intra-op Plan:   Post-operative Plan:   Informed Consent: I have reviewed the patients History and Physical, chart, labs and discussed the procedure including the risks, benefits and alternatives for the proposed anesthesia with the patient or authorized representative who has indicated his/her understanding and acceptance.     Plan Discussed with: CRNA  Anesthesia Plan Comments:         Anesthesia Quick Evaluation

## 2015-01-07 NOTE — Transfer of Care (Signed)
Immediate Anesthesia Transfer of Care Note  Patient: Jorge Maynard  Procedure(s) Performed: Procedure(s): COLONOSCOPY (N/A)  Patient Location: PACU and Endoscopy Unit  Anesthesia Type:General  Level of Consciousness: awake, alert , oriented and patient cooperative  Airway & Oxygen Therapy: Patient Spontanous Breathing and Patient connected to nasal cannula oxygen  Post-op Assessment: Report given to RN and Post -op Vital signs reviewed and stable  Post vital signs: Reviewed and stable  Last Vitals:  Filed Vitals:   01/07/15 0735  BP: 93/46  Pulse: 43  Temp: 36.3 C  Resp: 16    Complications: No apparent anesthesia complications

## 2015-01-07 NOTE — Anesthesia Postprocedure Evaluation (Signed)
  Anesthesia Post-op Note  Patient: Jorge Maynard  Procedure(s) Performed: Procedure(s): COLONOSCOPY (N/A)  Anesthesia type:General  Patient location: PACU  Post pain: Pain level controlled  Post assessment: Post-op Vital signs reviewed, Patient's Cardiovascular Status Stable, Respiratory Function Stable, Patent Airway and No signs of Nausea or vomiting  Post vital signs: Reviewed and stable  Last Vitals:  Filed Vitals:   01/07/15 0910  BP: 108/48  Pulse:   Temp:   Resp:     Level of consciousness: awake, alert  and patient cooperative  Complications: No apparent anesthesia complications

## 2015-01-07 NOTE — Op Note (Signed)
[facility] Gastroenterology Patient Name: Jorge Maynard Procedure Date: 01/07/2015 8:15 AM MRN: [id number] Account #: [id number] Date of Birth: Sep 20, 1934 Admit Type: Outpatient Age: 80 Room: [facility] ENDO ROOM 3 Gender: Male Note Status: Finalized Procedure:         Colonoscopy Indications:       Personal history of colonic polyps Providers:         Lollie Sails, MD Referring MD:      Rusty Aus, MD (Referring MD) Medicines:         Monitored Anesthesia Care Complications:     No immediate complications. Procedure:         Pre-Anesthesia Assessment:                    - ASA Grade Assessment: III - A patient with severe                     systemic disease.                    After obtaining informed consent, the colonoscope was                     passed under direct vision. Throughout the procedure, the                     patient's blood pressure, pulse, and oxygen saturations                     were monitored continuously. The Colonoscope was                     introduced through the anus and advanced to the the cecum,                     identified by appendiceal orifice and ileocecal valve. The                     patient tolerated the procedure well. The quality of the                     bowel preparation was fair. Findings:      A 8 mm polyp was found in the distal transverse colon. The polyp was       flat. The polyp was removed with a cold snare. Resection and retrieval       were complete.      A 1 mm polyp was found in the distal transverse colon. The polyp was       sessile. The polyp was removed with a cold biopsy forceps. Resection and       retrieval were complete.      Multiple medium-mouthed diverticula were found in the sigmoid colon, in       the descending colon, in the transverse colon and in the ascending colon.      The retroflexed view of the distal rectum and anal verge was normal and       showed no anal or rectal  abnormalities.      The exam was otherwise without abnormality.      The digital rectal exam was normal. Impression:        - One 8 mm polyp in the distal transverse colon. Resected  and retrieved.                    - One 1 mm polyp in the distal transverse colon. Resected                     and retrieved.                    - Diverticulosis in the sigmoid colon, in the descending                     colon, in the transverse colon and in the ascending colon.                    - The distal rectum and anal verge are normal on                     retroflexion view.                    - The examination was otherwise normal. Recommendation:    - Await pathology results.                    - Telephone GI clinic for pathology results in 1 week. Procedure Code(s): --- Professional ---                    [phone], Colonoscopy, flexible; with removal of tumor(s),                     polyp(s), or other lesion(s) by snare technique                    45380, 95, Colonoscopy, flexible; with biopsy, single or                     multiple Diagnosis Code(s): --- Professional ---                    211.3, Benign neoplasm of colon                    V12.72, Personal history of colonic polyps                    562.10, Diverticulosis of colon (without mention of                     hemorrhage) CPT copyright 2014 [employer]. All rights reserved. The codes documented in this report are preliminary and upon coder review may  be revised to meet current compliance requirements. Lollie Sails, MD 01/07/2015 8:54:42 AM This report has been signed electronically. Number of Addenda: 0 Note Initiated On: 01/07/2015 8:15 AM Scope Withdrawal Time: 0 hours 9 minutes 29 seconds  Total Procedure Duration: 0 hours 27 minutes 11 seconds       [facility]

## 2015-01-08 LAB — SURGICAL PATHOLOGY

## 2015-01-09 ENCOUNTER — Encounter: Payer: Self-pay | Admitting: Gastroenterology

## 2015-04-29 ENCOUNTER — Inpatient Hospital Stay: Payer: Medicare PPO | Attending: Oncology

## 2015-04-29 ENCOUNTER — Ambulatory Visit
Admission: RE | Admit: 2015-04-29 | Discharge: 2015-04-29 | Disposition: A | Discharge status: Home / Self Care | Payer: Medicare PPO | Source: Ambulatory Visit | Attending: Oncology | Admitting: Oncology

## 2015-04-29 DIAGNOSIS — C3490 Malignant neoplasm of unspecified part of unspecified bronchus or lung: Secondary | ICD-10-CM | POA: Insufficient documentation

## 2015-04-29 DIAGNOSIS — R911 Solitary pulmonary nodule: Secondary | ICD-10-CM | POA: Diagnosis not present

## 2015-04-29 DIAGNOSIS — Z9889 Other specified postprocedural states: Secondary | ICD-10-CM | POA: Diagnosis not present

## 2015-04-29 DIAGNOSIS — Z8589 Personal history of malignant neoplasm of other organs and systems: Secondary | ICD-10-CM | POA: Diagnosis present

## 2015-04-29 DIAGNOSIS — C499 Malignant neoplasm of connective and soft tissue, unspecified: Secondary | ICD-10-CM

## 2015-04-29 LAB — COMPREHENSIVE METABOLIC PANEL
ALT: 22 U/L (ref 17–63)
AST: 25 U/L (ref 15–41)
Albumin: 4 g/dL (ref 3.5–5.0)
Alkaline Phosphatase: 93 U/L (ref 38–126)
Anion gap: 9 (ref 5–15)
BUN: 29 mg/dL — ABNORMAL HIGH (ref 6–20)
CHLORIDE: 105 mmol/L (ref 101–111)
CO2: 22 mmol/L (ref 22–32)
Calcium: 8.8 mg/dL — ABNORMAL LOW (ref 8.9–10.3)
Creatinine, Ser: 1.06 mg/dL (ref 0.61–1.24)
Glucose, Bld: 141 mg/dL — ABNORMAL HIGH (ref 65–99)
Potassium: 3.6 mmol/L (ref 3.5–5.1)
Sodium: 136 mmol/L (ref 135–145)
Total Bilirubin: 0.9 mg/dL (ref 0.3–1.2)
Total Protein: 6.9 g/dL (ref 6.5–8.1)

## 2015-04-29 LAB — CBC WITH DIFFERENTIAL/PLATELET
BASOS ABS: 0.1 10*3/uL (ref 0–0.1)
Basophils Relative: 1 %
EOS ABS: 0.5 10*3/uL (ref 0–0.7)
Eosinophils Relative: 5 %
HCT: 38.9 % — ABNORMAL LOW (ref 40.0–52.0)
Hemoglobin: 13.2 g/dL (ref 13.0–18.0)
LYMPHS PCT: 18 %
Lymphs Abs: 1.9 10*3/uL (ref 1.0–3.6)
MCH: 31.3 pg (ref 26.0–34.0)
MCHC: 34 g/dL (ref 32.0–36.0)
MCV: 92.2 fL (ref 80.0–100.0)
MONO ABS: 0.7 10*3/uL (ref 0.2–1.0)
Monocytes Relative: 7 %
Neutro Abs: 7.1 10*3/uL — ABNORMAL HIGH (ref 1.4–6.5)
Neutrophils Relative %: 69 %
PLATELETS: 228 10*3/uL (ref 150–440)
RBC: 4.22 MIL/uL — ABNORMAL LOW (ref 4.40–5.90)
RDW: 14.5 % (ref 11.5–14.5)
WBC: 10.2 10*3/uL (ref 3.8–10.6)

## 2015-04-29 MED ORDER — IOHEXOL 300 MG/ML  SOLN
75.0000 mL | Freq: Once | INTRAMUSCULAR | Status: AC | PRN
  Administered 2015-04-29: 75 mL via INTRAVENOUS

## 2015-05-01 ENCOUNTER — Ambulatory Visit: Payer: Medicare PPO | Admitting: Oncology

## 2015-05-05 LAB — POCT I-STAT CREATININE: Creatinine, Ser: 1.1 mg/dL (ref 0.61–1.24)

## 2015-05-08 ENCOUNTER — Inpatient Hospital Stay: Payer: Medicare PPO | Admitting: Oncology

## 2015-05-13 ENCOUNTER — Inpatient Hospital Stay: Payer: Medicare PPO | Attending: Oncology | Admitting: Oncology

## 2015-05-13 ENCOUNTER — Encounter: Payer: Self-pay | Admitting: Oncology

## 2015-05-13 VITALS — BP 117/66 | HR 48 | Temp 96.5°F | Wt 164.2 lb

## 2015-05-13 DIAGNOSIS — I739 Peripheral vascular disease, unspecified: Secondary | ICD-10-CM | POA: Diagnosis not present

## 2015-05-13 DIAGNOSIS — Z79899 Other long term (current) drug therapy: Secondary | ICD-10-CM | POA: Diagnosis not present

## 2015-05-13 DIAGNOSIS — I493 Ventricular premature depolarization: Secondary | ICD-10-CM | POA: Diagnosis not present

## 2015-05-13 DIAGNOSIS — C499 Malignant neoplasm of connective and soft tissue, unspecified: Secondary | ICD-10-CM | POA: Diagnosis not present

## 2015-05-13 DIAGNOSIS — I659 Occlusion and stenosis of unspecified precerebral artery: Secondary | ICD-10-CM

## 2015-05-13 DIAGNOSIS — K219 Gastro-esophageal reflux disease without esophagitis: Secondary | ICD-10-CM | POA: Insufficient documentation

## 2015-05-13 DIAGNOSIS — I350 Nonrheumatic aortic (valve) stenosis: Secondary | ICD-10-CM | POA: Diagnosis not present

## 2015-05-13 DIAGNOSIS — I2510 Atherosclerotic heart disease of native coronary artery without angina pectoris: Secondary | ICD-10-CM | POA: Diagnosis not present

## 2015-05-13 DIAGNOSIS — Z87891 Personal history of nicotine dependence: Secondary | ICD-10-CM | POA: Insufficient documentation

## 2015-05-13 DIAGNOSIS — H5213 Myopia, bilateral: Secondary | ICD-10-CM | POA: Insufficient documentation

## 2015-05-13 DIAGNOSIS — R911 Solitary pulmonary nodule: Secondary | ICD-10-CM

## 2015-05-13 DIAGNOSIS — Z85828 Personal history of other malignant neoplasm of skin: Secondary | ICD-10-CM | POA: Insufficient documentation

## 2015-05-13 DIAGNOSIS — Z8719 Personal history of other diseases of the digestive system: Secondary | ICD-10-CM | POA: Diagnosis not present

## 2015-05-13 DIAGNOSIS — I6529 Occlusion and stenosis of unspecified carotid artery: Secondary | ICD-10-CM | POA: Insufficient documentation

## 2015-05-13 DIAGNOSIS — Z7982 Long term (current) use of aspirin: Secondary | ICD-10-CM | POA: Diagnosis not present

## 2015-05-13 DIAGNOSIS — I10 Essential (primary) hypertension: Secondary | ICD-10-CM | POA: Insufficient documentation

## 2015-05-13 DIAGNOSIS — H15839 Staphyloma posticum, unspecified eye: Secondary | ICD-10-CM | POA: Insufficient documentation

## 2015-05-13 DIAGNOSIS — C7800 Secondary malignant neoplasm of unspecified lung: Secondary | ICD-10-CM | POA: Diagnosis not present

## 2015-05-13 DIAGNOSIS — R918 Other nonspecific abnormal finding of lung field: Secondary | ICD-10-CM

## 2015-05-13 MED ORDER — INFLUENZA VAC SPLIT QUAD 0.5 ML IM SUSY
0.5000 mL | PREFILLED_SYRINGE | Freq: Once | INTRAMUSCULAR | Status: AC
  Administered 2015-05-13: 0.5 mL via INTRAMUSCULAR

## 2015-05-13 NOTE — Progress Notes (Signed)
Patient does have living will.  Former smoker.

## 2015-05-13 NOTE — Progress Notes (Signed)
**Note Jorge-Identified via Obfuscation** [place] @ [facility] [phone]  [phone]     Jorge Maynard OB: 03-27-1935  MR#: [id number]  BDZ#:[id number]  Patient Care Team: Jorge Aus, MD as PCP - General (Internal Medicine)  CHIEF COMPLAINT:  Chief Complaint  Patient presents with  . OTHER    Oncology History   1.right upper lobe mass, PET positive, status post laser resection in September of 2015 Pleomorphic undifferentiated sarcoma favor metastases.  Margins are clear 2.status post right temporal MOH'S  surgery  in 2011.  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  No cough no shortness of breath no chest pain no discomfort appetite has been stable. REVIEW OF SYSTEMS:   GENERAL:  Feels good.  Active.  No fevers, sweats or weight loss. PERFORMANCE STATUS (ECOG): 0 HEENT:  No visual changes, runny nose, sore throat, mouth sores or tenderness. Lungs: No shortness of breath or cough.  No hemoptysis. Cardiac:  No chest pain, palpitations, orthopnea, or PND. GI:  No nausea, vomiting, diarrhea, constipation, melena or hematochezia. GU:  No urgency, frequency, dysuria, or hematuria. Musculoskeletal:  No back pain.  No joint pain.  No muscle tenderness. Extremities:  No pain or swelling. Skin:  No rashes or skin changes. Neuro:  No headache, numbness or weakness, balance or coordination issues. Endocrine:  No diabetes, thyroid issues, hot flashes or night sweats. Psych:  No mood changes, depression or anxiety. Pain:  No focal pain. Review of systems:  All other systems reviewed and found to be negative. As per HPI. Otherwise, a complete review of systems is negatve.  PAST MEDICAL  HISTORY: Past Medical History  Diagnosis Date  . Sarcoma (Jorge Maynard) 12/31/2014  . Hypertension   . Coronary artery disease   . Peripheral vascular disease (Jorge Maynard)   . GERD (gastroesophageal reflux disease)   . Gout   . Aortic stenosis   . Carotid stenosis   . Pathologic myopia, both eyes   . Posterior staphyloma   . Secondary sarcoma of right lung (Jorge Maynard)   . Malignant neoplasm of skin     PAST SURGICAL HISTORY: Past Surgical History  Procedure Laterality Date  . Colonoscopy N/A 01/07/2015    Procedure: COLONOSCOPY;  Surgeon: Jorge Sails, MD;  Location: [facility] END[facility]COPY;  Service: Endoscopy;  Laterality: N/A;   Expand All Jorge Maynard Progress Note-Oncology Follow Up [Authored: 28-Jan-16 07:33]- for Visit: [id number], Complete, Entered, Signed in Full, General     Note Type  Oncology Follow Up    HPI:  Referred by Dr. Genevive Maynard.  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  Chief Complaint:     Historian  Patient     Presenting Problem  Patient is here for further evaluation regarding pulmonary nodules. Patient offers no complaints today. Patient does have living will.     Negative Symptoms  fever, chills, anorexia, weakness, nausea, vomiting, diarrhea, constipation, cough, shortness of breath, palpitations, burning with urination, urinary frequency, headache, numbness/tingling, back pain, hip pain, rash    Subjective:     Chief Complaint/Diagnosis:     1.right upper lobe mass,  PET positive, status post laser resection in September of 2015  Pleomorphic undifferentiated sarcoma favor metastases. Margins are clear  2.status post right temporal MOH'S surgery in 2011. 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 is a 80 year old white male who was in his usual state of  health until last week when he obtained a neck CT scan for followup of his bilateral carotid artery disease. On that scan they noticed that he had a right upper lobe mass that was not present 2 years prior when he had his similar neck x-rays obtained for evaluation of ataxia. [name] states that he is in excellent physical shape. He works out 3 times a week in [employer] and plays golf. He does not notice any shortness of breath. He is able to walk up and down steps. He does state that over the course of the last several years he has noticed that he is not quite as strong as he used to be but he feels that relative to his peers he is in excellent physical shap  patient underwent biopsy with wedge resection and was consistent with pleomorphic undifferentiated sarcoma.  patient had a previous surgeon he from the right temple area and was found to have similar sarcoma..  September 05, 2014  Patient is here for further follow-up regarding metastatic sarcoma to the lung status post resection no evidence of clinical disease. Remains asymptomatic. Had a repeat CT scan of the chest. No cough or shortness of breath no chest pain  Dec 31, 2014\patient came today for the follow-up. Since last evaluation did not have any other new significant problem. Patient had some basal cell and squamous cell removed from the face. No cough or shortness of breath  Review of Systems:     Gen. status: Patient is alert oriented not in any acute distress  Lungs: No cough or shortness of breath or hemoptysis  Cardiovascular system:  No chest pain. No palpitation. No paroxysmal nocturnal dyspnea.  HEENT:  No headache. No hearing loss. No ear pain. No nosebleed or congestion. No sore throat. No difficulty swallowing  Gastro intestinal system:  No heartburn. No nausea or vomiting. No abdominal pain. No diarrhea. No constipation. No rectal bleeding.  Skin:  No evidence of ecchymosis or rash.  Neurological system: Denies any headache  dizziness.  All other systems have been reviewed                                                             Allergies:  No Known Allergies:  Significant History/PMH:   Stent, Cardiac:   Hypertension:  Preventive Screening:      Has patient had any of the following test?  Colonscopy      Last Colonoscopy:  about 2 years ago     Smoking History:  Smoking History quit 50 years ago.  PFSH:      Comments: no significant family history of diabetes, colon cancer or any other malignancy or heart disease      Additional Past Medical and Surgical History: His past medical history is significant for a basal cell carcinoma of the nose as well as some type of skin malignancy requiring Mohs surgery on his  right temple. He states that the diagnosis was an unusual variant of a very aggressive tumor. Those details are not available to me at this time. He has also had some shoulder and elbow surgery as well as coronary artery stents.  the right temple biopsy          ADVANCED DIRECTIVES:  Patient does not have any living will or healthcare power of attorney.  Information was given .  Available resources had been discussed.  We will follow-up on subsequent appointments regarding this issue  HEALTH MAINTENANCE: Social History  Substance Use Topics  . Smoking status: Former Smoker    Quit date: 08/08/1965  . Smokeless tobacco: None  . Alcohol Use: None      No Known Allergies  Current Outpatient Prescriptions  Medication Sig Dispense Refill  . allopurinol (ZYLOPRIM) 300 MG tablet Take 300 mg by mouth daily.    Marland Kitchen amLODipine (NORVASC) 10 MG tablet Take 10 mg by mouth daily.    Marland Kitchen aspirin 81 MG tablet Take 81 mg by mouth daily.    Marland Kitchen atorvastatin (LIPITOR) 80 MG tablet Take 80 mg by mouth daily.    . carvedilol (COREG) 6.25 MG tablet Take 6.25 mg by mouth 2 (two) times daily with a meal.    . chlorthalidone (HYGROTON) 25 MG tablet Take 12.5 mg by mouth 2 (two) times daily.    .  clopidogrel (PLAVIX) 75 MG tablet Take 75 mg by mouth every morning.    . Esomeprazole Magnesium (NEXIUM 24HR PO) Take 22.3 mg by mouth daily.    Marland Kitchen ibuprofen (ADVIL,MOTRIN) 200 MG tablet Take 400 mg by mouth every 6 (six) hours as needed for moderate pain.    . ramipril (ALTACE) 10 MG capsule Take 10 mg by mouth 2 (two) times daily.    . sildenafil (REVATIO) 20 MG tablet Take 20 mg by mouth as needed (erectile dysfunction).     . polyethylene glycol-electrolytes (NULYTELY/GOLYTELY) 420 G solution Take 4,000 mLs by mouth once.  0   No current facility-administered medications for this visit.    OBJECTIVE:  Filed Vitals:   05/13/15 1054  BP: 117/66  Pulse: 48  Temp: 96.5 F (35.8 C)     Body mass index is 23.57 kg/(m^2).    ECOG FS:0 - Asymptomatic  PHYSICAL EXAM: General  status: Performance status is good.  Patient has not lost significant weight. Since last evaluation there is no significant change in the general status HEENT: No evidence of stomatitis. Sclera and conjunctivae :: No jaundice.   pale looking. Lungs: Air  entry equal on both sides.  No rhonchi.  No rales.  Cardiac: Heart sounds are normal.  No pericardial rub.  No murmur. Lymphatic system: Cervical, axillary, inguinal, lymph nodes not palpable GI: Abdomen is soft.liver and spleen not palpable.  No ascites.  Bowel sounds are normal.  No other palpable masses.  No tenderness . Lower extremity: No edema Neurological system: Higher functions, cranial nerves intact no evidence of peripheral neuropathy. Skin: No rash.  No ecchymosis.. No petechial hemorrhages   LAB RESULTS:  CBC Latest Ref Rng 04/29/2015 12/31/2014  WBC 3.8 - 10.6 K/uL 10.2 9.6  Hemoglobin 13.0 - 18.0 g/dL 13.2 12.6(L)  Hematocrit 40.0 - 52.0 % 38.9(L) 37.5(L)  Platelets 150 - 440 K/uL 228 207    No visits with results within 5 Day(s) from this visit. Latest known visit with results is:  Hospital Outpatient Visit on 04/29/2015  Component Date  Value Ref Range Status  .  Creatinine, Ser 04/29/2015 1.10  0.61 - 1.24 mg/dL Final       STUDIES: Ct Chest W Contrast  04/29/2015   CLINICAL DATA:  Patient with history of sarcoma, potentially metastatic. Status post partial right lung resection 06/2014.  EXAM: CT CHEST WITH CONTRAST  TECHNIQUE: Multidetector CT imaging of the chest was performed during intravenous contrast administration.  CONTRAST:  38m OMNIPAQUE IOHEXOL 300 MG/ML  SOLN  COMPARISON:  CT chest 09/02/2014  FINDINGS: Mediastinum/Nodes: No enlarged axillary, mediastinal or hilar lymphadenopathy. Multiple predominantly subcentimeter mediastinal lymph nodes are stable. Trace fluid within the superior pericardial recess. Normal heart size. Coronary arterial vascular calcifications.  Lungs/Pleura: Stable fibrotic changes within the lower lobes bilaterally. Central airways are patent. There is a new 2.0 x 2.4 cm right upper lobe nodule (image 22; series 2). No pleural effusion or pneumothorax. Postsurgical changes compatible with partial right upper lobectomy. Slight interval increase in size of 10 mm density along the right major fissure, previously measuring 7 mm (image 34; series 3). Interval increase in size of subpleural right lower lobe nodule measuring 6 mm, previously 5 mm (image 27; series 3).  Upper abdomen: Stable nodular thickening lateral limb left adrenal gland. Stable nodularity medial limb right adrenal gland.  Musculoskeletal: Thoracic and lumbar spine degenerative changes. No aggressive or acute appearing osseous lesions.  IMPRESSION: Interval development of a right upper lobe nodule measuring up to 2.4 cm, most compatible with metastatic disease. Postsurgical changes compatible with partial right upper lobectomy.  Slight interval increase in size of right lower lobe subpleural nodules.  Stable subcentimeter mediastinal lymph nodes.  Grossly unchanged bilateral lower lobe scarring.   Electronically Signed   By: DLovey NewcomerM.D.    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 And diagnosis was considered as primary undifferentiated pleomorphic sarcoma of the righttemple skene metastases to lung  Status post resection of the lung mass  Stage IV NED  . Patient remains stage IV status post resection NED.  Clinically this has been stage is T1, N0, M1 tumor   MEDICAL DECISION MAKING:  CT scan has been reviewed independently shows new nodule in the right upper lobe.  Measuring 2.4 cm compatible with recurrent disease.  There are subtle subcentimeter mediastinal lymph nodes.. CT scan has been reviewed independently and reviewed with the patient. I discussed possibility of radiation therapy with stereotactic radiation approach versus further resection.  We will obtain Dr. OFaith Rogueopinion  Case will be discussed in tumor conference Total duration of visit was 237mutes.  50% or more time was spent in counseling patient and family regarding prognosis and options of treatment and available resources  Patient expressed understanding and was in agreement with this plan. He also understands that He can call clinic at any time with any questions, concerns, or complaints.    No matching staging information was found for the patient.  JaForest GleasonMD   05/17/2015 4:31 PM

## 2015-05-17 ENCOUNTER — Encounter: Payer: Self-pay | Admitting: Oncology

## 2015-05-20 ENCOUNTER — Ambulatory Visit
Admission: RE | Admit: 2015-05-20 | Discharge: 2015-05-20 | Disposition: A | Discharge status: Home / Self Care | Payer: Medicare PPO | Source: Ambulatory Visit | Attending: Oncology | Admitting: Oncology

## 2015-05-20 DIAGNOSIS — R911 Solitary pulmonary nodule: Secondary | ICD-10-CM | POA: Diagnosis not present

## 2015-05-20 DIAGNOSIS — R918 Other nonspecific abnormal finding of lung field: Secondary | ICD-10-CM

## 2015-05-20 DIAGNOSIS — I2510 Atherosclerotic heart disease of native coronary artery without angina pectoris: Secondary | ICD-10-CM | POA: Diagnosis not present

## 2015-05-20 DIAGNOSIS — C499 Malignant neoplasm of connective and soft tissue, unspecified: Secondary | ICD-10-CM

## 2015-05-20 LAB — GLUCOSE, CAPILLARY: GLUCOSE-CAPILLARY: 109 mg/dL — AB (ref 65–99)

## 2015-05-20 MED ORDER — FLUDEOXYGLUCOSE F - 18 (FDG) INJECTION
12.7500 | Freq: Once | INTRAVENOUS | Status: DC | PRN
  Administered 2015-05-20: 12.75 via INTRAVENOUS
  Filled 2015-05-20: qty 12.75

## 2015-05-22 ENCOUNTER — Inpatient Hospital Stay (HOSPITAL_BASED_OUTPATIENT_CLINIC_OR_DEPARTMENT_OTHER): Payer: Medicare PPO | Admitting: Oncology

## 2015-05-22 ENCOUNTER — Inpatient Hospital Stay (HOSPITAL_BASED_OUTPATIENT_CLINIC_OR_DEPARTMENT_OTHER): Payer: Medicare PPO | Admitting: Cardiothoracic Surgery

## 2015-05-22 VITALS — BP 133/68 | HR 50 | Temp 96.7°F | Resp 18 | Ht 70.0 in | Wt 164.0 lb

## 2015-05-22 DIAGNOSIS — Z7982 Long term (current) use of aspirin: Secondary | ICD-10-CM

## 2015-05-22 DIAGNOSIS — H15839 Staphyloma posticum, unspecified eye: Secondary | ICD-10-CM

## 2015-05-22 DIAGNOSIS — K219 Gastro-esophageal reflux disease without esophagitis: Secondary | ICD-10-CM

## 2015-05-22 DIAGNOSIS — C499 Malignant neoplasm of connective and soft tissue, unspecified: Secondary | ICD-10-CM | POA: Diagnosis not present

## 2015-05-22 DIAGNOSIS — I6529 Occlusion and stenosis of unspecified carotid artery: Secondary | ICD-10-CM

## 2015-05-22 DIAGNOSIS — C7800 Secondary malignant neoplasm of unspecified lung: Secondary | ICD-10-CM

## 2015-05-22 DIAGNOSIS — Z85828 Personal history of other malignant neoplasm of skin: Secondary | ICD-10-CM

## 2015-05-22 DIAGNOSIS — Z87891 Personal history of nicotine dependence: Secondary | ICD-10-CM

## 2015-05-22 DIAGNOSIS — Z79899 Other long term (current) drug therapy: Secondary | ICD-10-CM

## 2015-05-22 DIAGNOSIS — I2510 Atherosclerotic heart disease of native coronary artery without angina pectoris: Secondary | ICD-10-CM

## 2015-05-22 DIAGNOSIS — I493 Ventricular premature depolarization: Secondary | ICD-10-CM

## 2015-05-22 DIAGNOSIS — H5213 Myopia, bilateral: Secondary | ICD-10-CM

## 2015-05-22 DIAGNOSIS — I10 Essential (primary) hypertension: Secondary | ICD-10-CM

## 2015-05-22 DIAGNOSIS — I739 Peripheral vascular disease, unspecified: Secondary | ICD-10-CM

## 2015-05-22 DIAGNOSIS — Z8719 Personal history of other diseases of the digestive system: Secondary | ICD-10-CM

## 2015-05-22 DIAGNOSIS — R918 Other nonspecific abnormal finding of lung field: Secondary | ICD-10-CM | POA: Diagnosis not present

## 2015-05-22 DIAGNOSIS — I350 Nonrheumatic aortic (valve) stenosis: Secondary | ICD-10-CM

## 2015-05-22 NOTE — Progress Notes (Signed)
Jorge Maynard Inpatient Post-Op Note  Patient ID: Jorge Maynard, male   DOB: [birth year], 80 y.o.   MRN: [id number]  HISTORY: Jorge Maynard returns today in follow-up. Approximately one year ago he was seen for a PET positive right upper lobe lesion. He was taken to the operating room where a right thoracotomy and wedge resection of a metastatic skin appendage tumor was diagnosed. He's been followed by Dr. Ramond Craver he recently was found to have an enlarging right upper lobe mass separate from his original tumor. This was PET positive. A biopsy has not yet been made. Pulmonary function studies have not yet been made. The patient states he's been doing very well. Does not complain of any shortness of breath or pain.   Filed Vitals:   05/22/15 1530  BP: 133/68  Pulse: 50  Temp: 96.7 F (35.9 C)  Resp: 18     EXAM: Resp: Lungs are clear bilaterally.  No respiratory distress, normal effort. Heart:  Regular with 3/6 SEM Abd:  Abdomen is soft, non distended and non tender. No masses are palpable.  There is no rebound and no guarding.  Neurological: Alert and oriented to person, place, and time. Coordination normal.  Skin: Skin is warm and dry. No rash noted. No diaphoretic. No erythema. No pallor.  Psychiatric: Normal mood and affect. Normal behavior. Judgment and thought content normal.    ASSESSMENT: Right upper lobe PET positive lesion separate from the original surgery   PLAN:   I had a long discussion with the patient regarding the indications and risks of various treatment options. I would like to check his pulmonary function studies. I believe that he would require a right upper lobectomy for complete resection. Indications and risks of redo thoracotomy with right upper lobe resection were discussed. Risks of bleeding infection air leak and death were all reviewed. Options were discussed with the patient. He will see Dr. Ramond Craver he today and further consultation. I will see the patient back and  further follow-up.    Nestor Lewandowsky, MD

## 2015-05-22 NOTE — Progress Notes (Signed)
Patient is here for follow-up. He states that he has been doing well and offers no complaints today.

## 2015-05-23 ENCOUNTER — Encounter: Payer: Self-pay | Admitting: Oncology

## 2015-05-23 NOTE — Progress Notes (Signed)
[place] @ [facility] [phone]  [phone]     Jorge Maynard Ralph OB: [birth year]  MR#: [id number]  XBM#:[id number]  Patient Care Team: Rusty Aus, MD as PCP - General (Internal Medicine)  CHIEF COMPLAINT:  No chief complaint on file.   Oncology History   1.right upper lobe mass, PET positive, status post laser resection in September of 2015 Pleomorphic undifferentiated sarcoma favor metastases.  Margins are clear 2.status post right temporal MOH'S  surgery  in 2011.  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  No cough no shortness of breath no chest pain no discomfort appetite has been stable.  Patient had a PET scan done and here to discuss the results of the further options of therapy Cough or shortness of breath REVIEW OF SYSTEMS:   GENERAL:  Feels good.  Active.  No fevers, sweats or weight loss. PERFORMANCE STATUS (ECOG): 0 HEENT:  No visual changes, runny nose, sore throat, mouth sores or tenderness. Lungs: No shortness of breath or cough.  No hemoptysis. Cardiac:  No chest pain, palpitations, orthopnea, or PND. GI:  No nausea, vomiting, diarrhea, constipation, melena or hematochezia. GU:  No urgency, frequency, dysuria, or hematuria. Musculoskeletal:  No back pain.  No joint pain.  No muscle tenderness. Extremities:  No pain or swelling. Skin:  No rashes or skin changes. Neuro:  No headache, numbness or weakness, balance or coordination issues. Endocrine:  No diabetes, thyroid issues, hot flashes or night sweats. Psych:  No mood changes, depression or anxiety. Pain:  No focal pain. Review of systems:  All other systems reviewed and found  to be negative. As per HPI. Otherwise, a complete review of systems is negatve.  PAST MEDICAL HISTORY: Past Medical History  Diagnosis Date  . Sarcoma ([place]) 12/31/2014  . Hypertension   . Coronary artery disease   . Peripheral vascular disease ([place])   . GERD (gastroesophageal reflux disease)   . Gout   . Aortic stenosis   . Carotid stenosis   . Pathologic myopia, both eyes   . Posterior staphyloma   . 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 Malignant neoplasm of skin     PAST SURGICAL HISTORY: Past Surgical History  Procedure Laterality Date  . Colonoscopy N/A 01/07/2015    Procedure: COLONOSCOPY;  Surgeon: Lollie Sails, MD;  Location: [facility] ENDOSCOPY;  Service: Endoscopy;  Laterality: N/A;   [facility] [place] Progress Note-Oncology Follow Up [Authored: 28-Jan-16 07:33]- for Visit: [id number], Complete, Entered, Signed in Full, General     Note Type  Oncology Follow Up    HPI:  Referred by Dr. Genevive Bi.  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  Chief Complaint:     Historian  Patient     Presenting Problem  Patient is here for further evaluation regarding pulmonary nodules. Patient offers no complaints today. Patient does have living will.     Negative Symptoms  fever, chills, anorexia, weakness, nausea, vomiting, diarrhea, constipation, cough, shortness of breath, palpitations, burning with urination, urinary frequency, headache, numbness/tingling, back pain, hip pain,  rash    Subjective:     Chief Complaint/Diagnosis:     1.right upper lobe mass, PET positive, status post laser resection in September of 2015  Pleomorphic undifferentiated sarcoma favor metastases. Margins are clear  2.status post right temporal MOH'S surgery in 2011. 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 On that scan they noticed that he had a right upper lobe mass that was not present 2 years prior when he had his similar neck x-rays obtained for evaluation of ataxia. Jorge Maynard states that he is in excellent physical shape. He works out 3 times a week in [employer] and plays golf. He does not notice any shortness of breath. He is able to walk up and down steps. He does state that over the course of the last several years he has noticed that he is not quite as strong as he used to be but he feels that relative to his peers he is in excellent physical shap  patient underwent biopsy with wedge resection and was consistent with pleomorphic undifferentiated sarcoma.  patient had a previous surgeon he from the right temple area and was found to have similar sarcoma..  September 05, 2014  Patient is here for further follow-up regarding metastatic sarcoma to the lung status post resection no evidence of clinical disease. Remains asymptomatic. Had a repeat CT scan of the chest. No cough or shortness of breath no chest pain  Dec 31, 2014\patient came today for the follow-up. Since last evaluation did not have any other new significant problem. Patient had some basal cell and squamous cell removed from the face. No cough or shortness of breath  Review of Systems:     Gen. status: Patient is alert oriented not in any acute distress  Lungs: No cough or shortness of breath or hemoptysis  Cardiovascular system:  No chest pain. No palpitation. No paroxysmal nocturnal dyspnea.  HEENT:  No headache. No hearing loss. No ear pain. No nosebleed or congestion. No sore throat. No difficulty swallowing  Gastro intestinal system:  No heartburn. No nausea or vomiting. No abdominal pain. No diarrhea. No constipation. No rectal  bleeding.  Skin:  No evidence of ecchymosis or rash.  Neurological system: Denies any headache dizziness.  All other systems have been reviewed                                                             Allergies:  No Known Allergies:  Significant History/PMH:   Stent, Cardiac:   Hypertension:  Preventive Screening:      Has patient had any of the following test?  Colonscopy      Last Colonoscopy:  about 2 years ago     Smoking History:  Smoking History quit 50 years ago.  PFSH:      Comments: no significant family history of diabetes, colon cancer or any other malignancy or heart disease      Additional Past Medical and Surgical History: His past medical history is significant for a  basal cell carcinoma of the nose as well as some type of skin malignancy requiring Mohs surgery on his right temple. He states that the diagnosis was an unusual variant of a very aggressive tumor. Those details are not available to me at this time. He has also had some shoulder and elbow surgery as well as coronary artery stents.  the right temple biopsy          ADVANCED DIRECTIVES:  Patient does not have any living will or healthcare power of attorney.  Information was given .  Available resources had been discussed.  We will follow-up on subsequent appointments regarding this issue  HEALTH MAINTENANCE: Social History  Substance Use Topics  . Smoking status: Former Smoker    Quit date: 08/08/1965  . Smokeless tobacco: None  . Alcohol Use: None      No Known Allergies  Current Outpatient Prescriptions  Medication Sig Dispense Refill  . allopurinol (ZYLOPRIM) 300 MG tablet Take 300 mg by mouth daily.    Marland Kitchen amLODipine (NORVASC) 10 MG tablet Take 10 mg by mouth daily.    Marland Kitchen aspirin 81 MG tablet Take 81 mg by mouth daily.    Marland Kitchen atorvastatin (LIPITOR) 80 MG tablet Take 80 mg by mouth daily.    . carvedilol (COREG) 6.25 MG tablet Take 6.25 mg by mouth 2 (two) times daily with a  meal.    . chlorthalidone (HYGROTON) 25 MG tablet Take 12.5 mg by mouth 2 (two) times daily.    . clopidogrel (PLAVIX) 75 MG tablet Take 75 mg by mouth every morning.    . Esomeprazole Magnesium (NEXIUM 24HR PO) Take 22.3 mg by mouth daily.    Marland Kitchen ibuprofen (ADVIL,MOTRIN) 200 MG tablet Take 400 mg by mouth every 6 (six) hours as needed for moderate pain.    . polyethylene glycol-electrolytes (NULYTELY/GOLYTELY) 420 G solution Take 4,000 mLs by mouth once.  0  . ramipril (ALTACE) 10 MG capsule Take 10 mg by mouth 2 (two) times daily.    . sildenafil (REVATIO) 20 MG tablet Take 20 mg by mouth as needed (erectile dysfunction).      No current facility-administered medications for this visit.   Facility-Administered Medications Ordered in Other Visits  Medication Dose Route Frequency Provider Last Rate Last Dose  . fludeoxyglucose F - 18 (FDG) injection 12.75 milli Curie  12.75 milli Curie Intravenous Once PRN Medication Radiologist, MD   12.75 milli Curie at 05/20/15 0840    OBJECTIVE:  There were no vitals filed for this visit.   There is no weight on file to calculate BMI.    ECOG FS:0 - Asymptomatic  PHYSICAL EXAM: General  status: Performance status is good.  Patient has not lost significant weight. Since last evaluation there is no significant change in the general status HEENT: No evidence of stomatitis. Sclera and conjunctivae :: No jaundice.   pale looking. Lungs: Air  entry equal on both sides.  No rhonchi.  No rales.  Cardiac: Heart sounds are normal.  No pericardial rub.  No murmur. Lymphatic system: Cervical, axillary, inguinal, lymph nodes not palpable GI: Abdomen is soft.liver and spleen not palpable.  No ascites.  Bowel sounds are normal.  No other palpable masses.  No tenderness . Lower extremity: No edema Neurological system: Higher functions, cranial nerves intact no evidence of peripheral neuropathy. Skin: No rash.  No ecchymosis.. No petechial hemorrhages   LAB  RESULTS:  CBC Latest Ref Rng 04/29/2015 12/31/2014  WBC 3.8 - 10.6 K/uL 10.2  9.6  Hemoglobin 13.0 - 18.0 g/dL 13.2 12.6(L)  Hematocrit 40.0 - 52.0 % 38.9(L) 37.5(L)  Platelets 150 - 440 K/uL 228 207    Hospital Outpatient Visit on 05/20/2015  Component Date Value Ref Range Status  . Glucose-Capillary 05/20/2015 109* 65 - 99 mg/dL Final       STUDIES: Ct Chest W Contrast  04/29/2015  CLINICAL DATA:  Patient with history of sarcoma, potentially metastatic. Status post partial right lung resection 06/2014. EXAM: CT CHEST WITH CONTRAST TECHNIQUE: Multidetector CT imaging of the chest was performed during intravenous contrast administration. CONTRAST:  13m OMNIPAQUE IOHEXOL 300 MG/ML  SOLN COMPARISON:  CT chest 09/02/2014 FINDINGS: Mediastinum/Nodes: No enlarged axillary, mediastinal or hilar lymphadenopathy. Multiple predominantly subcentimeter mediastinal lymph nodes are stable. Trace fluid within the superior pericardial recess. Normal heart size. Coronary arterial vascular calcifications. Lungs/Pleura: Stable fibrotic changes within the lower lobes bilaterally. Central airways are patent. There is a new 2.0 x 2.4 cm right upper lobe nodule (image 22; series 2). No pleural effusion or pneumothorax. Postsurgical changes compatible with partial right upper lobectomy. Slight interval increase in size of 10 mm density along the right major fissure, previously measuring 7 mm (image 34; series 3). Interval increase in size of subpleural right lower lobe nodule measuring 6 mm, previously 5 mm (image 27; series 3). Upper abdomen: Stable nodular thickening lateral limb left adrenal gland. Stable nodularity medial limb right adrenal gland. Musculoskeletal: Thoracic and lumbar spine degenerative changes. No aggressive or acute appearing osseous lesions. IMPRESSION: Interval development of a right upper lobe nodule measuring up to 2.4 cm, most compatible with metastatic disease. Postsurgical changes compatible  with partial right upper lobectomy. Slight interval increase in size of right lower lobe subpleural nodules. Stable subcentimeter mediastinal lymph nodes. Grossly unchanged bilateral lower lobe scarring. Electronically Signed   By: DLovey NewcomerM.D.   On: 04/29/2015 11:27   Nm Pet Image Restag (ps) Skull Base To Thigh  05/20/2015  CLINICAL DATA:  Subsequent treatment strategy for lung nodule. History of sarcoma. EXAM: NUCLEAR MEDICINE PET SKULL BASE TO THIGH TECHNIQUE: 12.8 mCi F-18 FDG was injected intravenously. Full-ring PET imaging was performed from the skull base to thigh after the radiotracer. CT data was obtained and used for attenuation correction and anatomic localization. FASTING BLOOD GLUCOSE:  Value: 109 mg/dl COMPARISON:  Chest CT 04/29/2015.  Most recent PET of 04/22/2014. FINDINGS: NECK No areas of abnormal hypermetabolism. CHEST Hypermetabolism corresponding to the previously described right upper lobe pulmonary nodule. This measures 2.7 cm and a S.U.V. max of 10.1 on image/series 80/3. This is similar in size to on the prior exam. No thoracic nodal hypermetabolism. ABDOMEN/PELVIS No areas of abnormal hypermetabolism. SKELETON No abnormal marrow activity. CT IMAGES PERFORMED FOR ATTENUATION CORRECTION Cerebral atrophy. Suspect left frontal lobe encephalomalacia. , including on image 1. Dense carotid atherosclerosis. No cervical adenopathy. Mild cardiomegaly with dense coronary artery atherosclerosis. Chest findings otherwise deferred to recent diagnostic CT. Other pulmonary nodules are below the resolution of PET. Right upper lobe surgical sutures. Advanced abdominal aortic atherosclerosis. Pancreatic atrophy. Mild prostatomegaly. No significant free fluid. IMPRESSION: 1. Isolated hypermetabolic right upper lobe pulmonary nodule, most consistent with metastatic disease. 2. No hypermetabolic extrapulmonary metastasis. 3.  Atherosclerosis, including within the coronary arteries. Electronically  Signed   By: KAbigail MiyamotoM.D.   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 And diagnosis was considered as primary undifferentiated pleomorphic sarcoma of the righttemple skene metastases to lung     MEDICAL DECISION MAKING:  PET scan has been reviewed independently he shows isolated hypermetabolic right pulmonary nodule.  There is no evidence of disease anywhere else.  We had prolonged discussion with patient that includes Surgical intervention I discussed situation with Dr. Jearld Lesch who believes that surgery may be lobectomy Also possibility of stereotactic radiation therapy had been discussed and I discussed the situation with Dr. Donella Stade, ideation oncologists will evaluate the patient. I do not believe at present time chemotherapy would be of any benefit because of having asymptomatic isolated single metastases requires local therapy.  Considering patient's 80 years of age I would like to concentrate on local therapy and continue to observe this patient this has been discussed with the patient and family. PET scan has been reviewed with the patient. Total duration of visit was 430 minutes.  50% or more time was spent in counseling patient and family regarding prognosis and options of treatment and available resources  Patient expressed understanding and was in agreement with this plan. He also understands that He can call clinic at any time with any questions, concerns, or complaints.    No matching staging information was found for the patient.  Forest Gleason, MD   05/23/2015 9:59 AM

## 2015-05-27 ENCOUNTER — Ambulatory Visit: Payer: Medicare PPO | Attending: Cardiothoracic Surgery

## 2015-05-27 ENCOUNTER — Other Ambulatory Visit: Payer: Self-pay | Admitting: Cardiothoracic Surgery

## 2015-05-27 DIAGNOSIS — R918 Other nonspecific abnormal finding of lung field: Secondary | ICD-10-CM | POA: Diagnosis present

## 2015-05-29 ENCOUNTER — Ambulatory Visit
Admission: RE | Admit: 2015-05-29 | Discharge: 2015-05-29 | Disposition: A | Discharge status: Home / Self Care | Payer: Medicare PPO | Source: Ambulatory Visit | Attending: Radiation Oncology | Admitting: Radiation Oncology

## 2015-05-29 ENCOUNTER — Encounter: Payer: Self-pay | Admitting: Radiation Oncology

## 2015-05-29 VITALS — BP 106/58 | HR 48 | Temp 96.0°F | Resp 18 | Wt 163.5 lb

## 2015-05-29 DIAGNOSIS — Z510 Encounter for antineoplastic radiation therapy: Secondary | ICD-10-CM | POA: Insufficient documentation

## 2015-05-29 DIAGNOSIS — R918 Other nonspecific abnormal finding of lung field: Secondary | ICD-10-CM

## 2015-05-29 DIAGNOSIS — C3491 Malignant neoplasm of unspecified part of right bronchus or lung: Secondary | ICD-10-CM | POA: Insufficient documentation

## 2015-05-29 DIAGNOSIS — Z87891 Personal history of nicotine dependence: Secondary | ICD-10-CM | POA: Insufficient documentation

## 2015-05-29 NOTE — Consult Note (Signed)
Except an outstanding is perfect of Radiation Oncology NEW PATIENT EVALUATION  Name: Jorge Maynard  MRN: [id number]  Date:   05/29/2015     DOB: August 16, 1934   This 80 y.o. male patient presents to the clinic for initial evaluation of solitary lung metastasis from pleomorphic sarcoma to the right lung.  REFERRING PHYSICIAN: Rusty Aus, MD  CHIEF COMPLAINT:  Chief Complaint  Patient presents with  . Lung Cancer    Pt is here for initial consultation of sarcoma metastatic to the lung    DIAGNOSIS: The encounter diagnosis was Lung mass.   PREVIOUS INVESTIGATIONS:  CT scans and PET/CT scan reviewed Clinical notes reviewed Surgical pathology report reviewed  HPI: Patient is a 80 year old male had Mohs chemosurgery back in 2011 for an atypical fibroxanthoma. Went on to develop a right upper lobe mass PET positive status post resection with diagnosis of pleomorphic undifferentiated sarcoma favoring metastasis in September 2015. He has done fairly well although recent repeat PET CT scan now shows a hypermetabolic lesion again in the right upper lobe close to the midline consistent with metastatic disease. He has been seen by surgical oncology with potential recommendation for lobectomy. He is seeking opinion now about radiation therapy for this lesion. He is asymptomatic specifically denies cough hemoptysis or chest tightness. Appetite is good and weight is stable. PET scan shows no evidence to suggest any primary tumor or other metastatic disease.  PLANNED TREATMENT REGIMEN: SB RT to right lung  PAST MEDICAL HISTORY:  has a past medical history of Sarcoma ([place]) (12/31/2014); Hypertension; Coronary artery disease; Peripheral vascular disease ([place]); GERD (gastroesophageal reflux disease); Gout; Aortic stenosis; Carotid stenosis; Pathologic myopia, both eyes; Posterior staphyloma; Secondary sarcoma of right lung ([employer]); and Malignant neoplasm of skin.    PAST SURGICAL HISTORY:  Past Surgical  History  Procedure Laterality Date  . Colonoscopy N/A 01/07/2015    Procedure: COLONOSCOPY;  Surgeon: Lollie Sails, MD;  Location: [facility] ENDOSCOPY;  Service: Endoscopy;  Laterality: N/A;    FAMILY HISTORY: family history is not on file.  SOCIAL HISTORY:  reports that he quit smoking about 49 years ago. He does not have any smokeless tobacco history on file.  ALLERGIES: Review of patient's allergies indicates no known allergies.  MEDICATIONS:  Current Outpatient Prescriptions  Medication Sig Dispense Refill  . allopurinol (ZYLOPRIM) 300 MG tablet Take 300 mg by mouth daily.    Marland Kitchen amLODipine (NORVASC) 10 MG tablet Take 10 mg by mouth daily.    Marland Kitchen aspirin 81 MG tablet Take 81 mg by mouth daily.    Marland Kitchen atorvastatin (LIPITOR) 80 MG tablet Take 80 mg by mouth daily.    . carvedilol (COREG) 6.25 MG tablet Take 6.25 mg by mouth 2 (two) times daily with a meal.    . chlorthalidone (HYGROTON) 25 MG tablet Take 12.5 mg by mouth 2 (two) times daily.    . clopidogrel (PLAVIX) 75 MG tablet Take 75 mg by mouth every morning.    . Esomeprazole Magnesium (NEXIUM 24HR PO) Take 22.3 mg by mouth daily.    Marland Kitchen ibuprofen (ADVIL,MOTRIN) 200 MG tablet Take 400 mg by mouth every 6 (six) hours as needed for moderate pain.    . polyethylene glycol-electrolytes (NULYTELY/GOLYTELY) 420 G solution Take 4,000 mLs by mouth once.  0  . ramipril (ALTACE) 10 MG capsule Take 10 mg by mouth 2 (two) times daily.    . sildenafil (REVATIO) 20 MG tablet Take 20 mg by mouth as needed (erectile dysfunction).  No current facility-administered medications for this encounter.    ECOG PERFORMANCE STATUS:  0 - Asymptomatic  REVIEW OF SYSTEMS:  Patient denies any weight loss, fatigue, weakness, fever, chills or night sweats. Patient denies any loss of vision, blurred vision. Patient denies any ringing  of the ears or hearing loss. No irregular heartbeat. Patient denies heart murmur or history of fainting. Patient denies any chest  pain or pain radiating to her upper extremities. Patient denies any shortness of breath, difficulty breathing at night, cough or hemoptysis. Patient denies any swelling in the lower legs. Patient denies any nausea vomiting, vomiting of blood, or coffee ground material in the vomitus. Patient denies any stomach pain. Patient states has had normal bowel movements no significant constipation or diarrhea. Patient denies any dysuria, hematuria or significant nocturia. Patient denies any problems walking, swelling in the joints or loss of balance. Patient denies any skin changes, loss of hair or loss of weight. Patient denies any excessive worrying or anxiety or significant depression. Patient denies any problems with insomnia. Patient denies excessive thirst, polyuria, polydipsia. Patient denies any swollen glands, patient denies easy bruising or easy bleeding. Patient denies any recent infections, allergies or URI. Patient "s visual fields have not changed significantly in recent time.    PHYSICAL EXAM: BP 106/58 mmHg  Pulse 48  Temp(Src) 96 F (35.6 C)  Resp 18  Wt 163 lb 7.5 oz (74.15 kg) Well-developed well-nourished patient in NAD. HEENT reveals PERLA, EOMI, discs not visualized.  Oral cavity is clear. No oral mucosal lesions are identified. Neck is clear without evidence of cervical or supraclavicular adenopathy. Lungs are clear to A&P. Cardiac examination is essentially unremarkable with regular rate and rhythm without murmur rub or thrill. Abdomen is benign with no organomegaly or masses noted. Motor sensory and DTR levels are equal and symmetric in the upper and lower extremities. Cranial nerves II through XII are grossly intact. Proprioception is intact. No peripheral adenopathy or edema is identified. No motor or sensory levels are noted. Crude visual fields are within normal range.  LABORATORY DATA: Prior pathology reports are reviewed    RADIOLOGY RESULTS: CT PET CT scans are  reviewed   IMPRESSION: Solitary metastatic lesion of the right upper lobe in 80 year old male with history of metastatic pleomorphic sarcoma to the lung status post resection  PLAN: At this time based on the patient's advanced age solitary nature of his lesion and size would recommend SB RT treatment to this lesion. Would plan on delivering 5000 cGy in 5 fractions. I have set up and ordered CT simulation with abdominal compression and MIP study for early next week. Risks and benefits of treatment including possible development of cough, fatigue, loss of normal lung volume, possible slight radiation esophagitis all were discussed in detail with the patient and his wife. Both seem to comprehend my treatment plan well. I discussed the case personally with medical oncology.  I would like to take this opportunity for allowing me to participate in the care of your patient.Armstead Peaks., MD

## 2015-06-04 ENCOUNTER — Ambulatory Visit
Admission: RE | Admit: 2015-06-04 | Discharge: 2015-06-04 | Disposition: A | Discharge status: Home / Self Care | Payer: Medicare PPO | Source: Ambulatory Visit | Attending: Radiation Oncology | Admitting: Radiation Oncology

## 2015-06-04 DIAGNOSIS — Z510 Encounter for antineoplastic radiation therapy: Secondary | ICD-10-CM | POA: Diagnosis not present

## 2015-06-04 DIAGNOSIS — Z87891 Personal history of nicotine dependence: Secondary | ICD-10-CM | POA: Diagnosis not present

## 2015-06-04 DIAGNOSIS — C3491 Malignant neoplasm of unspecified part of right bronchus or lung: Secondary | ICD-10-CM | POA: Diagnosis not present

## 2015-06-09 DIAGNOSIS — Z510 Encounter for antineoplastic radiation therapy: Secondary | ICD-10-CM | POA: Diagnosis not present

## 2015-06-10 DIAGNOSIS — Z510 Encounter for antineoplastic radiation therapy: Secondary | ICD-10-CM | POA: Diagnosis not present

## 2015-06-16 DIAGNOSIS — Z510 Encounter for antineoplastic radiation therapy: Secondary | ICD-10-CM | POA: Diagnosis not present

## 2015-06-17 ENCOUNTER — Ambulatory Visit
Admission: RE | Admit: 2015-06-17 | Discharge: 2015-06-17 | Disposition: A | Discharge status: Home / Self Care | Payer: Medicare PPO | Source: Ambulatory Visit | Attending: Radiation Oncology | Admitting: Radiation Oncology

## 2015-06-17 DIAGNOSIS — Z510 Encounter for antineoplastic radiation therapy: Secondary | ICD-10-CM | POA: Diagnosis not present

## 2015-06-19 ENCOUNTER — Ambulatory Visit: Payer: Medicare PPO | Admitting: Radiation Oncology

## 2015-06-19 DIAGNOSIS — Z510 Encounter for antineoplastic radiation therapy: Secondary | ICD-10-CM | POA: Diagnosis not present

## 2015-06-24 ENCOUNTER — Ambulatory Visit: Payer: Medicare PPO | Admitting: Radiation Oncology

## 2015-06-24 DIAGNOSIS — Z510 Encounter for antineoplastic radiation therapy: Secondary | ICD-10-CM | POA: Diagnosis not present

## 2015-06-26 ENCOUNTER — Ambulatory Visit: Payer: Medicare PPO | Admitting: Radiation Oncology

## 2015-06-26 DIAGNOSIS — Z510 Encounter for antineoplastic radiation therapy: Secondary | ICD-10-CM | POA: Diagnosis not present

## 2015-07-01 ENCOUNTER — Ambulatory Visit
Admission: RE | Admit: 2015-07-01 | Discharge: 2015-07-01 | Disposition: A | Discharge status: Home / Self Care | Payer: Medicare PPO | Source: Ambulatory Visit | Attending: Radiation Oncology | Admitting: Radiation Oncology

## 2015-07-01 DIAGNOSIS — Z510 Encounter for antineoplastic radiation therapy: Secondary | ICD-10-CM | POA: Diagnosis not present

## 2015-08-07 ENCOUNTER — Other Ambulatory Visit: Payer: Self-pay | Admitting: *Deleted

## 2015-08-07 ENCOUNTER — Encounter: Payer: Self-pay | Admitting: Radiation Oncology

## 2015-08-07 ENCOUNTER — Ambulatory Visit
Admission: RE | Admit: 2015-08-07 | Discharge: 2015-08-07 | Disposition: A | Discharge status: Home / Self Care | Payer: Medicare PPO | Source: Ambulatory Visit | Attending: Radiation Oncology | Admitting: Radiation Oncology

## 2015-08-07 VITALS — BP 115/71 | HR 57 | Temp 98.7°F | Wt 164.7 lb

## 2015-08-07 DIAGNOSIS — R918 Other nonspecific abnormal finding of lung field: Secondary | ICD-10-CM

## 2015-08-07 NOTE — Progress Notes (Signed)
Radiation Oncology Follow up Note  Name: Jorge Maynard   Date:   08/07/2015 MRN:  [id number] DOB: June 18, 1935    This 80 y.o. 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 He is seen today in routine follow-up doing fair. He has been having some slight mental status changes according to his wife. No headaches change in vision or ringing other neural neurologic sequela. He has developed a dry cough which we expect from the SB RT I have suggested Mucinex which she has been resistant to take in the past. He specifically denies any bone pain hemoptysis..  COMPLICATIONS OF TREATMENT: none  FOLLOW UP COMPLIANCE: keeps appointments   PHYSICAL EXAM:  BP 115/71 mmHg  Pulse 57  Temp(Src) 98.7 F (37.1 C)  Wt 164 lb 10.9 oz (74.7 kg) Well-developed well-nourished patient in NAD. HEENT reveals PERLA, EOMI, discs not visualized.  Oral cavity is clear. No oral mucosal lesions are identified. Neck is clear without evidence of cervical or supraclavicular adenopathy. Lungs are clear to A&P. Cardiac examination is essentially unremarkable with regular rate and rhythm without murmur rub or thrill. Abdomen is benign with no organomegaly or masses noted. Motor sensory and DTR levels are equal and symmetric in the upper and lower extremities. Cranial nerves II through XII are grossly intact. Proprioception is intact. No peripheral adenopathy or edema is identified. No motor or sensory levels are noted. Crude visual fields are within normal range.  RADIOLOGY RESULTS: No current films for review  PLAN: Present time he is doing fair he scheduled to see medical oncology next week. I'm hesitant to do an MRI scan based on the soft findings as reported by his wife. I  will let that be decided by medical oncology. Otherwise I've ordered a CT scan with contrast in about 2 and half months and see him shortly thereafter. He continues close follow-up care with medical oncology. I have strongly suggested he uses Mucinex and explained to him the mechanism of cough and how it is clearing out the debris from his lung. Patient knows to call sooner with any concerns.  I would like to take this opportunity for allowing me to participate in the care of your patient.Armstead Peaks., MD

## 2015-08-22 ENCOUNTER — Other Ambulatory Visit: Payer: Self-pay | Admitting: *Deleted

## 2015-08-25 ENCOUNTER — Encounter: Payer: Self-pay | Admitting: Oncology

## 2015-08-25 ENCOUNTER — Inpatient Hospital Stay: Payer: Medicare PPO

## 2015-08-25 ENCOUNTER — Inpatient Hospital Stay: Payer: Medicare PPO | Attending: Oncology | Admitting: Oncology

## 2015-08-25 VITALS — BP 124/78 | HR 61 | Temp 97.0°F | Resp 18 | Wt 162.3 lb

## 2015-08-25 DIAGNOSIS — Z923 Personal history of irradiation: Secondary | ICD-10-CM | POA: Insufficient documentation

## 2015-08-25 DIAGNOSIS — H15839 Staphyloma posticum, unspecified eye: Secondary | ICD-10-CM | POA: Diagnosis not present

## 2015-08-25 DIAGNOSIS — Z87891 Personal history of nicotine dependence: Secondary | ICD-10-CM | POA: Diagnosis not present

## 2015-08-25 DIAGNOSIS — G4710 Hypersomnia, unspecified: Secondary | ICD-10-CM | POA: Insufficient documentation

## 2015-08-25 DIAGNOSIS — R1310 Dysphagia, unspecified: Secondary | ICD-10-CM

## 2015-08-25 DIAGNOSIS — G9389 Other specified disorders of brain: Secondary | ICD-10-CM | POA: Diagnosis not present

## 2015-08-25 DIAGNOSIS — Z79899 Other long term (current) drug therapy: Secondary | ICD-10-CM | POA: Diagnosis not present

## 2015-08-25 DIAGNOSIS — R4781 Slurred speech: Secondary | ICD-10-CM | POA: Insufficient documentation

## 2015-08-25 DIAGNOSIS — I739 Peripheral vascular disease, unspecified: Secondary | ICD-10-CM | POA: Insufficient documentation

## 2015-08-25 DIAGNOSIS — K219 Gastro-esophageal reflux disease without esophagitis: Secondary | ICD-10-CM | POA: Insufficient documentation

## 2015-08-25 DIAGNOSIS — H5213 Myopia, bilateral: Secondary | ICD-10-CM | POA: Insufficient documentation

## 2015-08-25 DIAGNOSIS — C44309 Unspecified malignant neoplasm of skin of other parts of face: Secondary | ICD-10-CM | POA: Insufficient documentation

## 2015-08-25 DIAGNOSIS — F99 Mental disorder, not otherwise specified: Secondary | ICD-10-CM | POA: Diagnosis not present

## 2015-08-25 DIAGNOSIS — I10 Essential (primary) hypertension: Secondary | ICD-10-CM | POA: Diagnosis not present

## 2015-08-25 DIAGNOSIS — C7801 Secondary malignant neoplasm of right lung: Secondary | ICD-10-CM | POA: Insufficient documentation

## 2015-08-25 DIAGNOSIS — R0602 Shortness of breath: Secondary | ICD-10-CM | POA: Diagnosis not present

## 2015-08-25 DIAGNOSIS — I2510 Atherosclerotic heart disease of native coronary artery without angina pectoris: Secondary | ICD-10-CM | POA: Diagnosis not present

## 2015-08-25 DIAGNOSIS — C7800 Secondary malignant neoplasm of unspecified lung: Secondary | ICD-10-CM

## 2015-08-25 DIAGNOSIS — I350 Nonrheumatic aortic (valve) stenosis: Secondary | ICD-10-CM | POA: Insufficient documentation

## 2015-08-25 DIAGNOSIS — C499 Malignant neoplasm of connective and soft tissue, unspecified: Secondary | ICD-10-CM

## 2015-08-25 DIAGNOSIS — Z7982 Long term (current) use of aspirin: Secondary | ICD-10-CM | POA: Diagnosis not present

## 2015-08-25 DIAGNOSIS — M109 Gout, unspecified: Secondary | ICD-10-CM | POA: Diagnosis not present

## 2015-08-25 LAB — COMPREHENSIVE METABOLIC PANEL
ALBUMIN: 3.2 g/dL — AB (ref 3.5–5.0)
ALK PHOS: 138 U/L — AB (ref 38–126)
ALT: 58 U/L (ref 17–63)
AST: 34 U/L (ref 15–41)
Anion gap: 8 (ref 5–15)
BUN: 32 mg/dL — ABNORMAL HIGH (ref 6–20)
CALCIUM: 8.8 mg/dL — AB (ref 8.9–10.3)
CHLORIDE: 102 mmol/L (ref 101–111)
CO2: 25 mmol/L (ref 22–32)
CREATININE: 1 mg/dL (ref 0.61–1.24)
GFR calc Af Amer: >60 mL/min (ref >60)
GFR calc non Af Amer: >60 mL/min (ref >60)
GLUCOSE: 98 mg/dL (ref 65–99)
Potassium: 3.6 mmol/L (ref 3.5–5.1)
SODIUM: 135 mmol/L (ref 135–145)
Total Bilirubin: 0.6 mg/dL (ref 0.3–1.2)
Total Protein: 7 g/dL (ref 6.5–8.1)

## 2015-08-25 LAB — CBC WITH DIFFERENTIAL/PLATELET
Basophils Absolute: 0.1 10*3/uL (ref 0–0.1)
Basophils Relative: 1 %
Eosinophils Absolute: 0.8 10*3/uL — ABNORMAL HIGH (ref 0–0.7)
Eosinophils Relative: 9 %
HEMATOCRIT: 35.6 % — AB (ref 40.0–52.0)
HEMOGLOBIN: 12 g/dL — AB (ref 13.0–18.0)
LYMPHS ABS: 1.3 10*3/uL (ref 1.0–3.6)
LYMPHS PCT: 15 %
MCH: 30.4 pg (ref 26.0–34.0)
MCHC: 33.6 g/dL (ref 32.0–36.0)
MCV: 90.5 fL (ref 80.0–100.0)
Monocytes Absolute: 1.1 10*3/uL — ABNORMAL HIGH (ref 0.2–1.0)
Monocytes Relative: 13 %
NEUTROS PCT: 62 %
Neutro Abs: 5.4 10*3/uL (ref 1.4–6.5)
PLATELETS: 315 10*3/uL (ref 150–440)
RBC: 3.94 MIL/uL — AB (ref 4.40–5.90)
RDW: 13.8 % (ref 11.5–14.5)
WBC: 8.7 10*3/uL (ref 3.8–10.6)

## 2015-08-25 NOTE — Progress Notes (Signed)
[place] @ [facility] [phone]  [phone]     Jorge Maynard OB: [birth year]  MR#: [id number]  UVO#:[id number]  Patient Care Team: Rusty Aus, MD as PCP - General (Internal Medicine)  CHIEF COMPLAINT:  Chief Complaint  Patient presents with  . Sarcoma      Expand All Collapse All        Oncology History   1.right upper lobe mass, PET positive, status post laser resection in September of 2015 Pleomorphic undifferentiated sarcoma favor metastases.  Margins are clear 2.status post right temporal MOH'S  surgery  in 2011.  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  Patient had a recurrent lung nodule for which he underwent stereotactic radiation therapy on the right lung (November, 2016)  No flowsheet data found.  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 Patient is accompanied by his wife who states the patient is sleeping too much. Also states there has been some mental status changes. He is having problems with forming his words. He is also getting things mixed up.  Family reports for last 3 weeks patient has slurred speech.  When tried to talk the word does not correspond to action. Patient has occasional headache.  Complains of increasing cough and shortness of breath Here for further follow-up and treatment consideration   REVIEW OF SYSTEMS:   GENERAL:  Feels good.  Active.  No fevers, sweats or weight loss. PERFORMANCE STATUS (ECOG): 0 HEENT:  No visual changes, runny nose, sore throat, mouth sores or tenderness. Lungs he has increasing dry hacking cough.. Cardiac:  No chest pain, palpitations, orthopnea, or PND. GI:  No nausea,  vomiting, diarrhea, constipation, melena or hematochezia. GU:  No urgency, frequency, dysuria, or hematuria. Musculoskeletal:  No back pain.  No joint pain.  No muscle tenderness. Extremities:  No pain or swelling. Skin:  No rashes or skin changes. Neuro: Patient is having increasing difficulty to form sentences  Endocrine:  No diabetes, thyroid issues, hot flashes or night sweats. Psych:  No mood changes, depression or anxiety. Pain:  No focal pain. Review of systems:  All other systems reviewed and found to be negative. As per HPI. Otherwise, a complete review of systems is negatve.  PAST MEDICAL HISTORY: Past Medical History  Diagnosis Date  . Sarcoma ([place]) 12/31/2014  . Hypertension   . Coronary artery disease   . Peripheral vascular disease ([place])   . GERD (gastroesophageal reflux disease)   . Gout   . Aortic stenosis   . Carotid stenosis   . Pathologic myopia, both eyes   . Posterior staphyloma   . Secondary sarcoma of right lung ([place])   . Malignant neoplasm of skin     PAST SURGICAL HISTORY: Past Surgical History  Procedure Laterality Date  . Colonoscopy N/A 01/07/2015    Procedure: COLONOSCOPY;  Surgeon: Lollie Sails, MD;  Location: [facility] ENDOSCOPY;  Service: Endoscopy;  Laterality: N/A;   Expand All [place] Progress Note-Oncology Follow Up [Authored: 28-Jan-16 07:33]- for Visit: [id number], Complete, Entered, Signed in Full, General     Note Type  Oncology Follow Up    HPI:  Referred by Dr.  Oaks.  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  Chief Complaint:     Historian  Patient     Presenting Problem  Patient is here for further evaluation regarding pulmonary nodules. Patient offers no complaints today. Patient does have living will.     Negative Symptoms  fever, chills, anorexia, weakness, nausea, vomiting,  diarrhea, constipation, cough, shortness of breath, palpitations, burning with urination, urinary frequency, headache, numbness/tingling, back pain, hip pain, rash    Subjective:     Chief Complaint/Diagnosis:     1.right upper lobe mass, PET positive, status post laser resection in September of 2015  Pleomorphic undifferentiated sarcoma favor metastases. Margins are clear  2.status post right temporal MOH'S surgery in 2011. 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 On that scan they noticed that he had a right upper lobe mass that was not present 2 years prior when he had his similar neck x-rays obtained for evaluation of ataxia. Jorge Maynard states that he is in excellent physical shape. He works out 3 times a week in [employer] and plays golf. He does not notice any shortness of breath. He is able to walk up and down steps. He does state that over the course of the last several years he has noticed that he is not quite as strong as he used to be but he feels that relative to his peers he is in excellent physical shap  patient underwent biopsy with wedge resection and was consistent with pleomorphic undifferentiated sarcoma.  patient had a previous surgeon he from the right temple area and was found to have similar sarcoma..  September 05, 2014  Patient is here for further follow-up regarding metastatic sarcoma to the lung status post resection no evidence of clinical disease. Remains asymptomatic. Had a repeat CT scan of the chest. No cough or shortness of breath no chest pain  Dec 31, 2014\patient came today for the follow-up. Since last evaluation did not have any other new significant problem. Patient had some basal cell and squamous cell removed from the  face. No cough or shortness of breath  Review of Systems:     Gen. status: Patient is alert oriented not in any acute distress  Lungs: No cough or shortness of breath or hemoptysis  Cardiovascular system:  No chest pain. No palpitation. No paroxysmal nocturnal dyspnea.  HEENT:  No headache. No hearing loss. No ear pain. No nosebleed or congestion. No sore throat. No difficulty swallowing  Gastro intestinal system:  No heartburn. No nausea or vomiting. No abdominal pain. No diarrhea. No constipation. No rectal bleeding.  Skin:  No evidence of ecchymosis or rash.  Neurological system: Denies any headache dizziness.  All other systems have been reviewed                                                             Allergies:  No Known Allergies:  Significant History/PMH:  Stent, Cardiac:   Hypertension:  Preventive Screening:      Has patient had any of the following test?  Colonscopy      Last Colonoscopy:  about 2 years ago     Smoking History:  Smoking History quit 50 years ago.  PFSH:      Comments: no significant family history of diabetes, colon cancer or any other malignancy or heart disease      Additional Past Medical and Surgical History: His past medical history is significant for a basal cell carcinoma of the nose as well as some type of skin malignancy requiring Mohs surgery on his right temple. He states that the diagnosis was an unusual variant of a very aggressive tumor. Those details are not available to me at this time. He has also had some shoulder and elbow surgery as well as coronary artery stents.  the right temple biopsy          ADVANCED DIRECTIVES:  Patient does not have any living will or healthcare power of attorney.  Information was given .  Available resources had been discussed.  We will follow-up on subsequent appointments regarding this issue  HEALTH MAINTENANCE: Social History  Substance Use Topics  . Smoking status: Former Smoker      Quit date: 08/08/1965  . Smokeless tobacco: None  . Alcohol Use: None      No Known Allergies  Current Outpatient Prescriptions  Medication Sig Dispense Refill  . allopurinol (ZYLOPRIM) 300 MG tablet Take 300 mg by mouth daily.    Marland Kitchen amLODipine (NORVASC) 10 MG tablet Take 10 mg by mouth daily.    Marland Kitchen aspirin 81 MG tablet Take 81 mg by mouth daily.    Marland Kitchen atorvastatin (LIPITOR) 80 MG tablet Take 80 mg by mouth daily.    . carvedilol (COREG) 6.25 MG tablet Take 6.25 mg by mouth 2 (two) times daily with a meal.    . chlorthalidone (HYGROTON) 25 MG tablet Take 12.5 mg by mouth 2 (two) times daily.    . clopidogrel (PLAVIX) 75 MG tablet Take 75 mg by mouth every morning.    . Esomeprazole Magnesium (NEXIUM 24HR PO) Take 22.3 mg by mouth daily.    Marland Kitchen ibuprofen (ADVIL,MOTRIN) 200 MG tablet Take 400 mg by mouth every 6 (six) hours as needed for moderate pain.    . pseudoephedrine-guaifenesin (MUCINEX D) 60-600 MG 12 hr tablet Take 1 tablet by mouth every 12 (twelve) hours.    . ramipril (ALTACE) 10 MG capsule Take 10 mg by mouth 2 (two) times daily.     No current facility-administered medications for this visit.    OBJECTIVE:  Filed Vitals:   08/25/15 1537  BP: 124/78  Pulse: 61  Temp: 97 F (36.1 C)  Resp: 18     Body mass index is 23.28 kg/(m^2).    ECOG FS:0 - Asymptomatic  PHYSICAL EXAM: General  status: Performance status is good.  Patient has not lost significant weight. Since last evaluation there is no significant change in the general status HEENT: No evidence of stomatitis. Sclera and conjunctivae :: No jaundice.   pale looking. Lungs:  Emphysematous chest.  Bilateral rhonchi.  Lymphatic system: Cervical, axillary, inguinal, lymph nodes not palpable GI: Abdomen is soft.liver and spleen not palpable.  No ascites.  Bowel sounds are normal.  No other palpable masses.  No tenderness . Lower extremity: No edema  Skin: No rash.  No ecchymosis.. No petechial  hemorrhages   LAB RESULTS:  CBC  Latest Ref Rng 04/29/2015 12/31/2014  WBC 3.8 - 10.6 K/uL 10.2 9.6  Hemoglobin 13.0 - 18.0 g/dL 13.2 12.6(L)  Hematocrit 40.0 - 52.0 % 38.9(L) 37.5(L)  Platelets 150 - 440 K/uL 228 207    No visits with results within 5 Day(s) from this visit. Latest known visit with results is:  Hospital Outpatient Visit on 05/20/2015  Component Date Value Ref Range Status  . Glucose-Capillary 05/20/2015 109* 65 - 99 mg/dL Final       STUDIES: No results found.  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 And diagnosis was considered as primary undifferentiated pleomorphic sarcoma of the righttemple skene metastases to lung     MEDICAL DECISION MAKING:  1.  Patient has new onset of neurological symptoms. There is no other focal signs except for dysphagia CT scan of brain would be recommended with contrast 2.  Increasing cough and shortness of breath possibility of radiation pneumonitis cannot be ruled out CT scan of the chest was ordered  Patient expressed understanding and was in agreement with this plan. He also understands that He can call clinic at any time with any questions, concerns, or complaints.    No matching staging information was found for the patient.  Forest Gleason, MD   08/25/2015 4:00 PM

## 2015-08-25 NOTE — Progress Notes (Signed)
Patient states he is having more exertional SOB.  Patient is accompanied by his wife who states the patient is sleeping too much.  Also states there has been some mental status changes.  He is having problems with forming his words.  He is also getting things mixed up.

## 2015-08-26 ENCOUNTER — Other Ambulatory Visit: Payer: Self-pay | Admitting: *Deleted

## 2015-08-26 DIAGNOSIS — C7800 Secondary malignant neoplasm of unspecified lung: Secondary | ICD-10-CM

## 2015-08-27 ENCOUNTER — Ambulatory Visit
Admission: RE | Admit: 2015-08-27 | Discharge: 2015-08-27 | Disposition: A | Discharge status: Home / Self Care | Payer: Medicare PPO | Source: Ambulatory Visit | Attending: Oncology | Admitting: Oncology

## 2015-08-27 ENCOUNTER — Telehealth: Payer: Self-pay | Admitting: *Deleted

## 2015-08-27 ENCOUNTER — Ambulatory Visit: Admission: RE | Admit: 2015-08-27 | Payer: Medicare PPO | Source: Ambulatory Visit

## 2015-08-27 DIAGNOSIS — R05 Cough: Secondary | ICD-10-CM | POA: Diagnosis present

## 2015-08-27 DIAGNOSIS — Z923 Personal history of irradiation: Secondary | ICD-10-CM | POA: Insufficient documentation

## 2015-08-27 DIAGNOSIS — C7801 Secondary malignant neoplasm of right lung: Secondary | ICD-10-CM | POA: Diagnosis not present

## 2015-08-27 DIAGNOSIS — M899 Disorder of bone, unspecified: Secondary | ICD-10-CM | POA: Insufficient documentation

## 2015-08-27 DIAGNOSIS — C499 Malignant neoplasm of connective and soft tissue, unspecified: Secondary | ICD-10-CM

## 2015-08-27 DIAGNOSIS — R0602 Shortness of breath: Secondary | ICD-10-CM | POA: Insufficient documentation

## 2015-08-27 DIAGNOSIS — C7800 Secondary malignant neoplasm of unspecified lung: Secondary | ICD-10-CM

## 2015-08-27 MED ORDER — DEXAMETHASONE 4 MG PO TABS: ORAL_TABLET | ORAL | Status: DC

## 2015-08-27 MED ORDER — IOHEXOL 300 MG/ML  SOLN
75.0000 mL | Freq: Once | INTRAMUSCULAR | Status: AC | PRN
Start: 2015-08-27 — End: 2015-08-27
  Administered 2015-08-27: 75 mL via INTRAVENOUS

## 2015-08-27 NOTE — Telephone Encounter (Signed)
Per Dr. Oliva Bustard Jorge Maynard has ct of head showing lesion and dr Oliva Bustard wants to see Jorge Maynard tom am 11:45 and start dexamethasone 4 mg and he takes 2 pills bid with food.  She will go and get the med and they will be at the appt in am.

## 2015-08-28 ENCOUNTER — Inpatient Hospital Stay (HOSPITAL_BASED_OUTPATIENT_CLINIC_OR_DEPARTMENT_OTHER): Payer: Medicare PPO | Admitting: Oncology

## 2015-08-28 ENCOUNTER — Inpatient Hospital Stay: Payer: Medicare PPO | Admitting: Oncology

## 2015-08-28 ENCOUNTER — Encounter: Payer: Self-pay | Admitting: Oncology

## 2015-08-28 VITALS — BP 127/70 | HR 90 | Temp 96.0°F | Resp 18 | Wt 161.4 lb

## 2015-08-28 DIAGNOSIS — H5213 Myopia, bilateral: Secondary | ICD-10-CM

## 2015-08-28 DIAGNOSIS — H15839 Staphyloma posticum, unspecified eye: Secondary | ICD-10-CM

## 2015-08-28 DIAGNOSIS — C7801 Secondary malignant neoplasm of right lung: Secondary | ICD-10-CM

## 2015-08-28 DIAGNOSIS — I350 Nonrheumatic aortic (valve) stenosis: Secondary | ICD-10-CM

## 2015-08-28 DIAGNOSIS — C44309 Unspecified malignant neoplasm of skin of other parts of face: Secondary | ICD-10-CM | POA: Diagnosis not present

## 2015-08-28 DIAGNOSIS — Z923 Personal history of irradiation: Secondary | ICD-10-CM

## 2015-08-28 DIAGNOSIS — R0602 Shortness of breath: Secondary | ICD-10-CM

## 2015-08-28 DIAGNOSIS — G9389 Other specified disorders of brain: Secondary | ICD-10-CM | POA: Diagnosis not present

## 2015-08-28 DIAGNOSIS — I10 Essential (primary) hypertension: Secondary | ICD-10-CM

## 2015-08-28 DIAGNOSIS — C7931 Secondary malignant neoplasm of brain: Secondary | ICD-10-CM

## 2015-08-28 DIAGNOSIS — K219 Gastro-esophageal reflux disease without esophagitis: Secondary | ICD-10-CM

## 2015-08-28 DIAGNOSIS — R1310 Dysphagia, unspecified: Secondary | ICD-10-CM | POA: Diagnosis not present

## 2015-08-28 DIAGNOSIS — Z79899 Other long term (current) drug therapy: Secondary | ICD-10-CM

## 2015-08-28 DIAGNOSIS — R4781 Slurred speech: Secondary | ICD-10-CM

## 2015-08-28 DIAGNOSIS — I2510 Atherosclerotic heart disease of native coronary artery without angina pectoris: Secondary | ICD-10-CM

## 2015-08-28 DIAGNOSIS — C7800 Secondary malignant neoplasm of unspecified lung: Secondary | ICD-10-CM

## 2015-08-28 DIAGNOSIS — I739 Peripheral vascular disease, unspecified: Secondary | ICD-10-CM

## 2015-08-28 DIAGNOSIS — C499 Malignant neoplasm of connective and soft tissue, unspecified: Secondary | ICD-10-CM

## 2015-08-28 DIAGNOSIS — Z7982 Long term (current) use of aspirin: Secondary | ICD-10-CM

## 2015-08-28 DIAGNOSIS — M109 Gout, unspecified: Secondary | ICD-10-CM

## 2015-08-28 DIAGNOSIS — Z87891 Personal history of nicotine dependence: Secondary | ICD-10-CM

## 2015-08-28 DIAGNOSIS — F99 Mental disorder, not otherwise specified: Secondary | ICD-10-CM

## 2015-08-28 DIAGNOSIS — G4710 Hypersomnia, unspecified: Secondary | ICD-10-CM

## 2015-08-28 NOTE — Progress Notes (Signed)
[place] @ [facility] [phone]  [phone]) [phone]     Jorge Maynard OB: 07/08/1935  MR#: [id number]  RFX#:[id number]  Patient Care Team: Rusty Aus, MD as PCP - General (Internal Medicine)  CHIEF COMPLAINT:  Chief Complaint  Patient presents with  . Results      Expand All Collapse All        Oncology History   1.right upper lobe mass, PET positive, status post laser resection in September of 2015 Pleomorphic undifferentiated sarcoma favor metastases.  Margins are clear 2.status post right temporal MOH'S  surgery  in 2011.  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  Patient had a recurrent lung nodule for which he underwent stereotactic radiation therapy on the right lung (November, 2016)  No flowsheet data found.  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 Patient is accompanied by his wife who states the patient is sleeping too much. Also states there has been some mental status changes. He is having problems with forming his words. He is also getting things mixed up.  Family reports for last 3 weeks patient has slurred speech.  When tried to talk the word does not correspond to action. Patient has occasional headache.  Complains of increasing cough and shortness of breath Here for further follow-up and treatment consideration  Patient had a CT scan of chest and a CT scan of brain. Both have been reviewed independently and reviewed with the patient. Lung nodule appears to decrease in size The brain CT scan is abnormal with a huge frontal lobe mass approximately 5 cmon the left side. [place] and was started on Decadron 8 mg twice a day.  Patient is  here to discuss the results and the further planning of treatment   REVIEW OF SYSTEMS:   GENERAL:  Feels good.  Active.  No fevers, sweats or weight loss. PERFORMANCE STATUS (ECOG): 0 HEENT:  No visual changes, runny nose, sore throat, mouth sores or tenderness. Lungs he has increasing dry hacking cough.. Cardiac:  No chest pain, palpitations, orthopnea, or PND. GI:  No nausea, vomiting, diarrhea, constipation, melena or hematochezia. GU:  No urgency, frequency, dysuria, or hematuria. Musculoskeletal:  No back pain.  No joint pain.  No muscle tenderness. Extremities:  No pain or swelling. Skin:  No rashes or skin changes. Neuro: Patient is having increasing difficulty to form sentences  Endocrine:  No diabetes, thyroid issues, hot flashes or night sweats. Psych:  No mood changes, depression or anxiety. Pain:  No focal pain. Review of systems:  All other systems reviewed and found to be negative. As per HPI. Otherwise, a complete review of systems is negatve.  PAST MEDICAL HISTORY: Past Medical History  Diagnosis Date  . Sarcoma ([place]) 12/31/2014  . Hypertension   . Coronary artery disease   . Peripheral vascular disease ([place])   . GERD (gastroesophageal reflux disease)   . Gout   . Aortic stenosis   . Carotid stenosis   . Pathologic myopia, both eyes   . Posterior staphyloma   . 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 Malignant neoplasm of skin     PAST SURGICAL HISTORY: Past Surgical History  Procedure Laterality Date  . Colonoscopy N/A 01/07/2015    Procedure: COLONOSCOPY;  Surgeon: Lollie Sails, MD;  Location: [facility] ENDOSCOPY;  Service: Endoscopy;  Laterality: N/A;   Expand All [employer] Progress Note-Oncology Follow Up [Authored: 28-Jan-16 07:33]- for Visit: [id number], Complete, Entered, Signed in Full, General     Note Type  Oncology Follow Up    HPI:  Referred by Dr. Genevive Bi.  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  Chief Complaint:     Historian  Patient     Presenting Problem  Patient is here for further evaluation regarding pulmonary nodules. Patient offers no complaints today. Patient does have living will.     Negative Symptoms  fever, chills, anorexia, weakness, nausea, vomiting, diarrhea, constipation, cough, shortness of breath, palpitations, burning with urination, urinary frequency, headache, numbness/tingling, back pain, hip pain, rash    Subjective:     Chief Complaint/Diagnosis:     1.right upper lobe mass, PET positive, status post laser resection in September of 2015  Pleomorphic undifferentiated sarcoma favor metastases. Margins are clear  2.status post right temporal MOH'S surgery in 2011. 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 is a 81 year old white male who was in his usual state of health until last week when he obtained a neck CT scan for followup of his bilateral carotid artery disease. On that scan they noticed that he had a right upper lobe mass that was not present 2 years prior when he had his similar neck x-rays obtained for evaluation of ataxia. Mr. Essick states that he is in excellent physical shape. He works out 3 times a week in [employer] and plays golf. He does not notice any shortness of breath. He is able to walk up and down steps. He does state that over the course of the last several years he has noticed that he is not quite as strong as he used to be but he feels that relative to his peers he is in excellent physical shap  patient underwent biopsy with wedge resection and was consistent with pleomorphic undifferentiated sarcoma.  patient had a previous surgeon he from the right temple area and was found to have similar sarcoma..  September 05, 2014  Patient is here for further follow-up  regarding metastatic sarcoma to the lung status post resection no evidence of clinical disease. Remains asymptomatic. Had a repeat CT scan of the chest. No cough or shortness of breath no chest pain  Dec 31, 2014\patient came today for the follow-up. Since last evaluation did not have any other new significant problem. Patient had some basal cell and squamous cell removed from the face. No cough or shortness of breath  Review of Systems:     Gen. status: Patient is alert oriented not in any acute distress  Lungs: No cough or shortness of breath or hemoptysis  Cardiovascular system:  No chest pain. No palpitation. No paroxysmal nocturnal dyspnea.  HEENT:  No headache. No hearing loss. No ear pain. No nosebleed or congestion. No sore throat. No difficulty swallowing  Gastro intestinal system:  No heartburn. No nausea or vomiting. No abdominal pain. No diarrhea. No constipation. No rectal bleeding.  Skin:  No evidence of ecchymosis or rash.  Neurological system: Denies any headache dizziness.  All  other systems have been reviewed                                                             Allergies:  No Known Allergies:  Significant History/PMH:   Stent, Cardiac:   Hypertension:  Preventive Screening:      Has patient had any of the following test?  Colonscopy      Last Colonoscopy:  about 2 years ago     Smoking History:  Smoking History quit 50 years ago.  PFSH:      Comments: no significant family history of diabetes, colon cancer or any other malignancy or heart disease      Additional Past Medical and Surgical History: His past medical history is significant for a basal cell carcinoma of the nose as well as some type of skin malignancy requiring Mohs surgery on his right temple. He states that the diagnosis was an unusual variant of a very aggressive tumor. Those details are not available to me at this time. He has also had some shoulder and elbow surgery as well as  coronary artery stents.  the right temple biopsy          ADVANCED DIRECTIVES:  Patient does not have any living will or healthcare power of attorney.  Information was given .  Available resources had been discussed.  We will follow-up on subsequent appointments regarding this issue  HEALTH MAINTENANCE: Social History  Substance Use Topics  . Smoking status: Former Smoker    Quit date: 08/08/1965  . Smokeless tobacco: None  . Alcohol Use: None      No Known Allergies  Current Outpatient Prescriptions  Medication Sig Dispense Refill  . allopurinol (ZYLOPRIM) 300 MG tablet Take 300 mg by mouth daily.    Marland Kitchen amLODipine (NORVASC) 10 MG tablet Take 10 mg by mouth daily.    Marland Kitchen aspirin 81 MG tablet Take 81 mg by mouth daily.    Marland Kitchen atorvastatin (LIPITOR) 80 MG tablet Take 80 mg by mouth daily.    . carvedilol (COREG) 6.25 MG tablet Take 6.25 mg by mouth 2 (two) times daily with a meal.    . chlorthalidone (HYGROTON) 25 MG tablet Take 12.5 mg by mouth 2 (two) times daily.    . clopidogrel (PLAVIX) 75 MG tablet Take 75 mg by mouth every morning.    Marland Kitchen dexamethasone (DECADRON) 4 MG tablet 2 tablets twice a day  With food  and further instructions to follow ( pt will be seen 08/28/15 in office) 60 tablet 0  . Esomeprazole Magnesium (NEXIUM 24HR PO) Take 22.3 mg by mouth daily.    Marland Kitchen ibuprofen (ADVIL,MOTRIN) 200 MG tablet Take 400 mg by mouth every 6 (six) hours as needed for moderate pain.    . pseudoephedrine-guaifenesin (MUCINEX D) 60-600 MG 12 hr tablet Take 1 tablet by mouth every 12 (twelve) hours.    . ramipril (ALTACE) 10 MG capsule Take 10 mg by mouth 2 (two) times daily.     No current facility-administered medications for this visit.    OBJECTIVE:  Filed Vitals:   08/28/15 1201  BP: 127/70  Pulse: 90  Temp: 96 F (35.6 C)  Resp: 18     Body mass index is 23.16 kg/(m^2).    ECOG FS:0 - Asymptomatic  PHYSICAL EXAM: General  status: Performance status is good.  Patient has not  lost significant weight. Since last evaluation there is no significant change in the general status HEENT: No evidence of stomatitis. Sclera and conjunctivae :: No jaundice.   pale looking. Lungs:  Emphysematous chest.  Bilateral rhonchi.  Lymphatic system: Cervical, axillary, inguinal, lymph nodes not palpable GI: Abdomen is soft.liver and spleen not palpable.  No ascites.  Bowel sounds are normal.  No other palpable masses.  No tenderness . Lower extremity: No edema  Skin: No rash.  No ecchymosis.. No petechial hemorrhages   LAB RESULTS:  CBC Latest Ref Rng 08/25/2015 04/29/2015  WBC 3.8 - 10.6 K/uL 8.7 10.2  Hemoglobin 13.0 - 18.0 g/dL 12.0(L) 13.2  Hematocrit 40.0 - 52.0 % 35.6(L) 38.9(L)  Platelets 150 - 440 K/uL 315 228    Appointment on 08/25/2015  Component Date Value Ref Range Status  . WBC 08/25/2015 8.7  3.8 - 10.6 K/uL Final  . RBC 08/25/2015 3.94* 4.40 - 5.90 MIL/uL Final  . Hemoglobin 08/25/2015 12.0* 13.0 - 18.0 g/dL Final  . HCT 08/25/2015 35.6* 40.0 - 52.0 % Final  . MCV 08/25/2015 90.5  80.0 - 100.0 fL Final  . MCH 08/25/2015 30.4  26.0 - 34.0 pg Final  . MCHC 08/25/2015 33.6  32.0 - 36.0 g/dL Final  . RDW 08/25/2015 13.8  11.5 - 14.5 % Final  . Platelets 08/25/2015 315  150 - 440 K/uL Final  . Neutrophils Relative % 08/25/2015 62   Final  . Neutro Abs 08/25/2015 5.4  1.4 - 6.5 K/uL Final  . Lymphocytes Relative 08/25/2015 15   Final  . Lymphs Abs 08/25/2015 1.3  1.0 - 3.6 K/uL Final  . Monocytes Relative 08/25/2015 13   Final  . Monocytes Absolute 08/25/2015 1.1* 0.2 - 1.0 K/uL Final  . Eosinophils Relative 08/25/2015 9   Final  . Eosinophils Absolute 08/25/2015 0.8* 0 - 0.7 K/uL Final  . Basophils Relative 08/25/2015 1   Final  . Basophils Absolute 08/25/2015 0.1  0 - 0.1 K/uL Final  . Sodium 08/25/2015 135  135 - 145 mmol/L Final  . Potassium 08/25/2015 3.6  3.5 - 5.1 mmol/L Final  . Chloride 08/25/2015 102  101 - 111 mmol/L Final  . CO2 08/25/2015 25   22 - 32 mmol/L Final  . Glucose, Bld 08/25/2015 98  65 - 99 mg/dL Final  . BUN 08/25/2015 32* 6 - 20 mg/dL Final  . Creatinine, Ser 08/25/2015 1.00  0.61 - 1.24 mg/dL Final  . Calcium 08/25/2015 8.8* 8.9 - 10.3 mg/dL Final  . Total Protein 08/25/2015 7.0  6.5 - 8.1 g/dL Final  . Albumin 08/25/2015 3.2* 3.5 - 5.0 g/dL Final  . AST 08/25/2015 34  15 - 41 U/L Final  . ALT 08/25/2015 58  17 - 63 U/L Final  . Alkaline Phosphatase 08/25/2015 138* 38 - 126 U/L Final  . Total Bilirubin 08/25/2015 0.6  0.3 - 1.2 mg/dL Final  . GFR calc non Af Amer 08/25/2015 >60  >60 mL/min Final  . GFR calc Af Amer 08/25/2015 >60  >60 mL/min Final   Comment: (NOTE) The eGFR has been calculated using the CKD EPI equation. This calculation has not been validated in all clinical situations. eGFR's persistently <60 mL/min signify possible Chronic Kidney Disease.   . Anion gap 08/25/2015 8  5 - 15 Final       STUDIES: Ct Head W Wo Contrast  08/27/2015  CLINICAL DATA:  Cough with shortness of breath. Difficulty forming sentences. Pleomorphic undifferentiated sarcoma, likely metastatic to the lung, resected in 2015. EXAM: CT HEAD WITHOUT AND WITH CONTRAST TECHNIQUE: Contiguous axial images were obtained from the base of the skull through the vertex without and with intravenous contrast CONTRAST:  44m OMNIPAQUE IOHEXOL 300 MG/ML  SOLN COMPARISON:  PET scan 05/20/2015.  CT chest reported separately. FINDINGS: There is a large LEFT frontal mass, 53 x 65 mm cross-section, with marked surrounding edema. The mass is roughly isodense with cortex precontrast. Avid postcontrast enhancement with small area of central necrosis. No other definite areas of metastatic disease although MRI is more sensitive and is recommended for further evaluation. Marked surrounding vasogenic edema extends throughout the frontal lobe into the temporal lobe, but no features suggestive of acute infarction. Slight hyperdensity centrally within the mass  could represent microhemorrhage, but there is no dominant parenchymal hemorrhage. No hydrocephalus. Subfalcine left-to-right shift at the level of the genu corpus callosum measures 4 mm. No osseous lesions. Sinuses and mastoids clear. Vascular calcification. IMPRESSION: 5 x 6 cm, LEFT frontal enhancing lesion, surrounding vasogenic edema with mild left-to-right shift, consistent with a solitary metastasis. Neurosurgical consultation is warranted. These results were called by telephone at the time of interpretation on 08/27/2015 at 1:16 pm to Dr. JForest Gleason, who verbally acknowledged these results. Electronically Signed   By: JStaci RighterM.D.   On: 08/27/2015 13:17   Ct Chest W Contrast  08/27/2015  CLINICAL DATA:  Cough, shortness of breath. Right upper lobe lung metastasis (pleomorphic undifferentiated sarcoma), status post laser resection in September 2015. EXAM: CT CHEST WITH CONTRAST TECHNIQUE: Multidetector CT imaging of the chest was performed during intravenous contrast administration. CONTRAST:  759mOMNIPAQUE IOHEXOL 300 MG/ML  SOLN COMPARISON:  PET-CT dated 05/20/2015. FINDINGS: Mediastinum/Nodes: The heart is normal in size. No pericardial effusion. Coronary atherosclerosis.  Coronary stent. Atherosclerotic calcifications aortic arch. Small mediastinal lymph nodes measuring up to 7 mm short axis. Visualized thyroid is unremarkable. Lungs/Pleura: 1.3 x 1.8 cm nodule in the medial right upper lobe, previously 2.1 x 2.7 cm and hypermetabolic on prior PET, compatible with metastasis. Radiation changes in the medial right upper lobe and superior segment right lower lobe, new. Additional patchy opacity in the left upper lobe and superior segment left lower lobe, new, likely reflecting additional radiation changes. Subpleural reticulation/ fibrosis with bronchiectasis in the right lower lobe with mild subpleural reticulation/ fibrosis at the left lung base, chronic. Underlying mild emphysematous changes.  No pleural effusion or pneumothorax. Upper abdomen: Visualized upper abdomen is notable for vascular calcifications. Musculoskeletal: Degenerative changes of the visualized thoracolumbar spine. IMPRESSION: Improving right upper lobe nodule/metastasis, now measuring 1.3 x 1.8 cm. Radiation changes, predominantly in the right upper lobe and superior segment right lower lobe. Electronically Signed   By: SrJulian Hy.D.   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 And diagnosis was considered as primary undifferentiated pleomorphic sarcoma of the righttemple skene metastases to lung     MEDICAL DECISION MAKING:  Ct  scan of the chest shows significant reduction in the size of the lung nodule. CT scan of the brain shows huge frontal lobe lesion,  Both scans have been reviewed independently and reviewed with the patient.  Also reviewed CT scan with  Dr. Baruch Gouty, radiation oncologist he does not believe primary radiation therapy can be helpful with such a huge mass .  I reviewed that CT scan with neurosurgeon in [place]  And he agreed that patient needs to be evaluated neurosurgical the.  Patient has an appointment tomorrow at 1:30   To see  Neurosurgeon.  We will continue Decadron pending neurosurgical evaluation.  Patient expressed understanding and was in agreement with this plan. He also understands that He can call clinic at any time with any questions, concerns, or complaints.    No matching staging information was found for the patient.  Forest Gleason, MD   08/28/2015 12:22 PM

## 2015-08-28 NOTE — Progress Notes (Signed)
Patient here for CT results.  

## 2015-09-01 ENCOUNTER — Other Ambulatory Visit: Payer: Self-pay | Admitting: Neurosurgery

## 2015-09-02 ENCOUNTER — Other Ambulatory Visit (HOSPITAL_COMMUNITY): Payer: Self-pay | Admitting: *Deleted

## 2015-09-02 ENCOUNTER — Encounter (HOSPITAL_COMMUNITY)
Admission: RE | Admit: 2015-09-02 | Discharge: 2015-09-02 | Disposition: A | Discharge status: Home / Self Care | Payer: Medicare PPO | Source: Ambulatory Visit | Attending: Neurosurgery | Admitting: Neurosurgery

## 2015-09-02 ENCOUNTER — Other Ambulatory Visit: Payer: Self-pay | Admitting: Neurosurgery

## 2015-09-02 ENCOUNTER — Encounter (HOSPITAL_COMMUNITY): Payer: Self-pay

## 2015-09-02 ENCOUNTER — Other Ambulatory Visit: Payer: Self-pay

## 2015-09-02 DIAGNOSIS — Z01818 Encounter for other preprocedural examination: Secondary | ICD-10-CM | POA: Insufficient documentation

## 2015-09-02 DIAGNOSIS — Z955 Presence of coronary angioplasty implant and graft: Secondary | ICD-10-CM

## 2015-09-02 DIAGNOSIS — I10 Essential (primary) hypertension: Secondary | ICD-10-CM

## 2015-09-02 DIAGNOSIS — K219 Gastro-esophageal reflux disease without esophagitis: Secondary | ICD-10-CM

## 2015-09-02 DIAGNOSIS — I2510 Atherosclerotic heart disease of native coronary artery without angina pectoris: Secondary | ICD-10-CM

## 2015-09-02 DIAGNOSIS — Z01812 Encounter for preprocedural laboratory examination: Secondary | ICD-10-CM

## 2015-09-02 DIAGNOSIS — I350 Nonrheumatic aortic (valve) stenosis: Secondary | ICD-10-CM

## 2015-09-02 DIAGNOSIS — Z79899 Other long term (current) drug therapy: Secondary | ICD-10-CM

## 2015-09-02 DIAGNOSIS — Z7982 Long term (current) use of aspirin: Secondary | ICD-10-CM

## 2015-09-02 DIAGNOSIS — Z87891 Personal history of nicotine dependence: Secondary | ICD-10-CM | POA: Insufficient documentation

## 2015-09-02 DIAGNOSIS — Z7902 Long term (current) use of antithrombotics/antiplatelets: Secondary | ICD-10-CM

## 2015-09-02 DIAGNOSIS — Z0183 Encounter for blood typing: Secondary | ICD-10-CM | POA: Insufficient documentation

## 2015-09-02 DIAGNOSIS — G939 Disorder of brain, unspecified: Secondary | ICD-10-CM | POA: Insufficient documentation

## 2015-09-02 DIAGNOSIS — D332 Benign neoplasm of brain, unspecified: Secondary | ICD-10-CM

## 2015-09-02 DIAGNOSIS — I739 Peripheral vascular disease, unspecified: Secondary | ICD-10-CM

## 2015-09-02 HISTORY — DX: Benign prostatic hyperplasia without lower urinary tract symptoms: N40.0

## 2015-09-02 HISTORY — DX: Other complications of anesthesia, initial encounter: T88.59XA

## 2015-09-02 HISTORY — DX: Other specified postprocedural states: Z98.890

## 2015-09-02 HISTORY — DX: Nausea with vomiting, unspecified: R11.2

## 2015-09-02 HISTORY — DX: Unspecified osteoarthritis, unspecified site: M19.90

## 2015-09-02 HISTORY — DX: Other amnesia: R41.3

## 2015-09-02 HISTORY — DX: Adverse effect of unspecified anesthetic, initial encounter: T41.45XA

## 2015-09-02 LAB — CBC
HCT: 37.3 % — ABNORMAL LOW (ref 39.0–52.0)
HEMOGLOBIN: 12.7 g/dL — AB (ref 13.0–17.0)
MCH: 30.8 pg (ref 26.0–34.0)
MCHC: 34 g/dL (ref 30.0–36.0)
MCV: 90.3 fL (ref 78.0–100.0)
PLATELETS: 318 10*3/uL (ref 150–400)
RBC: 4.13 MIL/uL — ABNORMAL LOW (ref 4.22–5.81)
RDW: 13.5 % (ref 11.5–15.5)
WBC: 17.6 10*3/uL — ABNORMAL HIGH (ref 4.0–10.5)

## 2015-09-02 LAB — BASIC METABOLIC PANEL
ANION GAP: 11 (ref 5–15)
BUN: 38 mg/dL — ABNORMAL HIGH (ref 6–20)
CALCIUM: 8.9 mg/dL (ref 8.9–10.3)
CO2: 22 mmol/L (ref 22–32)
Chloride: 105 mmol/L (ref 101–111)
Creatinine, Ser: 1.04 mg/dL (ref 0.61–1.24)
GFR calc Af Amer: >60 mL/min (ref >60)
GLUCOSE: 132 mg/dL — AB (ref 65–99)
Potassium: 4 mmol/L (ref 3.5–5.1)
Sodium: 138 mmol/L (ref 135–145)

## 2015-09-02 LAB — TYPE AND SCREEN
ABO/RH(D): O POS
ANTIBODY SCREEN: NEGATIVE

## 2015-09-02 LAB — ABO/RH: ABO/RH(D): O POS

## 2015-09-02 NOTE — Progress Notes (Signed)
Pt has CAD with stents since 2003. Cardiologist is Dr. Pleas Patricia at [facility]. Pt denies any recent chest pain. Does have some sob, hx of lung cancer. States cardiologist is not aware of pt having surgery. He was instructed by Dr. Kathyrn Sheriff to stop Plavix and Aspirin.  Pt is having memory issues and difficulty getting his words out. Wife states it is getting progressively worse.

## 2015-09-02 NOTE — Pre-Procedure Instructions (Signed)
Jorge Maynard  09/02/2015     Your procedure is scheduled on Friday, September 05, 2015 at 2:20 PM.   Report to [facility] Entrance "A" Admitting Office at 12:00 Noon.   Call this number if you have problems the morning of surgery [phone]               Any questions prior to day of surgery, please call [phone] between 8 & 4 PM.   Remember:  Do not eat food or drink liquids after midnight Thursday, 09/04/15.  Take these medicines the morning of surgery with A SIP OF WATER: Amlodipine (Norvasc), Carvedilol (Coreg), Esomeprazole (Nexium)  Stop Aspirin, Plavix,  NSAIDS (Ibuprofen, Aleve, etc.) and Mucinex D as of today.   Do not wear jewelry.  Do not wear lotions, powders, or cologne.  You may wear deodorant.  Men may shave face and neck.  Do not bring valuables to the hospital.  [facility] is not responsible for any belongings or valuables.  Contacts, dentures or bridgework may not be worn into surgery.  Leave your suitcase in the car.  After surgery it may be brought to your room.  For patients admitted to the hospital, discharge time will be determined by your treatment team.  Special instructions:  [place] - Preparing for Surgery  Before surgery, you can play an important role.  Because skin is not sterile, your skin needs to be as free of germs as possible.  You can reduce the number of germs on you skin by washing with CHG (chlorahexidine gluconate) soap before surgery.  CHG is an antiseptic cleaner which kills germs and bonds with the skin to continue killing germs even after washing.  Please DO NOT use if you have an allergy to CHG or antibacterial soaps.  If your skin becomes reddened/irritated stop using the CHG and inform your nurse when you arrive at Short Stay.  Do not shave (including legs and underarms) for at least 48 hours prior to the first CHG shower.  You may shave your face.  Please follow these instructions carefully:   1.  Shower  with CHG Soap the night before surgery and the                                morning of Surgery.  2.  If you choose to wash your hair, wash your hair first as usual with your       normal shampoo.  3.  After you shampoo, rinse your hair and body thoroughly to remove the                      Shampoo.  4.  Use CHG as you would any other liquid soap.  You can apply chg directly       to the skin and wash gently with scrungie or a clean washcloth.  5.  Apply the CHG Soap to your body ONLY FROM THE NECK DOWN.        Do not use on open wounds or open sores.  Avoid contact with your eyes, ears, mouth and genitals (private parts).  Wash genitals (private parts) with your normal soap.  6.  Wash thoroughly, paying special attention to the area where your surgery        will be performed.  7.  Thoroughly rinse your body with warm water from the neck down.  8.  DO NOT shower/wash with your normal soap after using and rinsing off       the CHG Soap.  9.  Pat yourself dry with a clean towel.            10.  Wear clean pajamas.            11.  Place clean sheets on your bed the night of your first shower and do not        sleep with pets.  Day of Surgery  Do not apply any lotions the morning of surgery.  Please wear clean clothes to the hospital.   Please read over the following fact sheets that you were given. Pain Booklet, Coughing and Deep Breathing, Blood Transfusion Information and Surgical Site Infection Prevention

## 2015-09-03 ENCOUNTER — Encounter (HOSPITAL_COMMUNITY): Payer: Self-pay

## 2015-09-03 NOTE — Progress Notes (Signed)
Anesthesia Chart Review:  Pt is 81 year old male scheduled for stereotactic R craniotomy for resection of tumor with BrainLab on 09/05/2015 with Dr. Kathyrn Sheriff.   Cardiologist is Dr. Pleas Patricia at [facility] (care everywhere).   PMH includes:  CAD (2009 Cardiac catheterization EF 50%, 90% mid LAD, 75% small OM, 90% distal PL; 2009 DES to mid LAD; 2003 LAD PCI with cypher stent), moderate aortic stenosis, HTN, PVD, malignant skin neoplasm, brain mass, GERD. Former smoker. BMI 24.   Medications include: amlodipine, ASA, lipitor, carvedilol, chlorthalidone, plavix, dexamethasone, nexium, ramipril. Pt to stop ASA and plavix 09/02/15.   Preoperative labs reviewed.  WBC 17.6.   CT chest 08/27/15:  1. Improving right upper lobe nodule/metastasis, now measuring 1.3 x 1.8 cm. 2. Radiation changes, predominantly in the right upper lobe and superior segment right lower lobe.  EKG 09/02/15: Sinus bradycardia (51 bpm). Minimal voltage criteria for LVH, may be normal variant  Echo 12/13/13 (care everywhere):  - Normal LV systolic function with moderate LVH - Normal RV systolic function - Valvular regurgitation: Mild AR, trivial MR, trivial PR, trivial TR - Moderate aortic stenosis: 3.2 m/s peak vel, 42 mmHg peak grad, 22 mmHg mean grad, 0.9 cm2 by DOPPLER  Reviewed case with Dr. Lissa Hoard. Pt will need cardiac eval prior to surgery. Notified Manuela Schwartz in Dr. Cleotilde Neer office.   Willeen Cass, FNP-BC [facility] Stay [facility]/Anesthesiology Phone: [phone] 09/03/2015 11:49 AM

## 2015-09-04 ENCOUNTER — Inpatient Hospital Stay: Admission: RE | Admit: 2015-09-04 | Payer: Medicare PPO | Source: Ambulatory Visit

## 2015-09-04 ENCOUNTER — Ambulatory Visit
Admission: RE | Admit: 2015-09-04 | Discharge: 2015-09-04 | Disposition: A | Discharge status: Home / Self Care | Payer: Medicare PPO | Source: Ambulatory Visit | Attending: Neurosurgery | Admitting: Neurosurgery

## 2015-09-04 DIAGNOSIS — D332 Benign neoplasm of brain, unspecified: Secondary | ICD-10-CM

## 2015-09-04 MED ORDER — GADOBENATE DIMEGLUMINE 529 MG/ML IV SOLN
15.0000 mL | Freq: Once | INTRAVENOUS | Status: AC | PRN
  Administered 2015-09-04: 15 mL via INTRAVENOUS

## 2015-09-04 MED ORDER — CEFAZOLIN SODIUM-DEXTROSE 2-3 GM-% IV SOLR
2.0000 g | INTRAVENOUS | Status: AC
  Administered 2015-09-05 (×2): 2 g via INTRAVENOUS
  Filled 2015-09-04: qty 50

## 2015-09-05 ENCOUNTER — Inpatient Hospital Stay (HOSPITAL_COMMUNITY)
Admission: RE | Admit: 2015-09-05 | Discharge: 2015-09-12 | DRG: 025 | Disposition: A | Discharge status: Skilled Nursing Facility | Payer: Medicare PPO | Source: Ambulatory Visit | Attending: Neurosurgery | Admitting: Neurosurgery

## 2015-09-05 ENCOUNTER — Inpatient Hospital Stay (HOSPITAL_COMMUNITY): Payer: Medicare PPO | Admitting: Emergency Medicine

## 2015-09-05 ENCOUNTER — Encounter (HOSPITAL_COMMUNITY): Payer: Self-pay | Admitting: *Deleted

## 2015-09-05 ENCOUNTER — Inpatient Hospital Stay (HOSPITAL_COMMUNITY): Payer: Medicare PPO | Admitting: Anesthesiology

## 2015-09-05 ENCOUNTER — Encounter (HOSPITAL_COMMUNITY)
Admission: RE | Disposition: A | Discharge status: Skilled Nursing Facility | Payer: Self-pay | Source: Ambulatory Visit | Attending: Neurosurgery

## 2015-09-05 DIAGNOSIS — Z9889 Other specified postprocedural states: Secondary | ICD-10-CM | POA: Diagnosis not present

## 2015-09-05 DIAGNOSIS — N400 Enlarged prostate without lower urinary tract symptoms: Secondary | ICD-10-CM | POA: Diagnosis present

## 2015-09-05 DIAGNOSIS — Z9841 Cataract extraction status, right eye: Secondary | ICD-10-CM

## 2015-09-05 DIAGNOSIS — Z794 Long term (current) use of insulin: Secondary | ICD-10-CM | POA: Diagnosis not present

## 2015-09-05 DIAGNOSIS — M1990 Unspecified osteoarthritis, unspecified site: Secondary | ICD-10-CM | POA: Diagnosis present

## 2015-09-05 DIAGNOSIS — K219 Gastro-esophageal reflux disease without esophagitis: Secondary | ICD-10-CM | POA: Diagnosis present

## 2015-09-05 DIAGNOSIS — J9600 Acute respiratory failure, unspecified whether with hypoxia or hypercapnia: Secondary | ICD-10-CM | POA: Diagnosis not present

## 2015-09-05 DIAGNOSIS — Y92239 Unspecified place in hospital as the place of occurrence of the external cause: Secondary | ICD-10-CM | POA: Diagnosis not present

## 2015-09-05 DIAGNOSIS — C7800 Secondary malignant neoplasm of unspecified lung: Secondary | ICD-10-CM | POA: Diagnosis present

## 2015-09-05 DIAGNOSIS — Z79899 Other long term (current) drug therapy: Secondary | ICD-10-CM | POA: Diagnosis not present

## 2015-09-05 DIAGNOSIS — Z87891 Personal history of nicotine dependence: Secondary | ICD-10-CM | POA: Diagnosis not present

## 2015-09-05 DIAGNOSIS — R739 Hyperglycemia, unspecified: Secondary | ICD-10-CM | POA: Diagnosis not present

## 2015-09-05 DIAGNOSIS — Z9842 Cataract extraction status, left eye: Secondary | ICD-10-CM | POA: Diagnosis not present

## 2015-09-05 DIAGNOSIS — Z961 Presence of intraocular lens: Secondary | ICD-10-CM | POA: Diagnosis present

## 2015-09-05 DIAGNOSIS — C7931 Secondary malignant neoplasm of brain: Secondary | ICD-10-CM | POA: Diagnosis present

## 2015-09-05 DIAGNOSIS — D72829 Elevated white blood cell count, unspecified: Secondary | ICD-10-CM | POA: Diagnosis present

## 2015-09-05 DIAGNOSIS — C499 Malignant neoplasm of connective and soft tissue, unspecified: Secondary | ICD-10-CM | POA: Diagnosis present

## 2015-09-05 DIAGNOSIS — Z978 Presence of other specified devices: Secondary | ICD-10-CM

## 2015-09-05 DIAGNOSIS — R5381 Other malaise: Secondary | ICD-10-CM | POA: Diagnosis present

## 2015-09-05 DIAGNOSIS — Z955 Presence of coronary angioplasty implant and graft: Secondary | ICD-10-CM | POA: Diagnosis not present

## 2015-09-05 DIAGNOSIS — I350 Nonrheumatic aortic (valve) stenosis: Secondary | ICD-10-CM | POA: Diagnosis present

## 2015-09-05 DIAGNOSIS — D62 Acute posthemorrhagic anemia: Secondary | ICD-10-CM | POA: Diagnosis not present

## 2015-09-05 DIAGNOSIS — Z7982 Long term (current) use of aspirin: Secondary | ICD-10-CM | POA: Diagnosis not present

## 2015-09-05 DIAGNOSIS — T380X5A Adverse effect of glucocorticoids and synthetic analogues, initial encounter: Secondary | ICD-10-CM | POA: Diagnosis not present

## 2015-09-05 DIAGNOSIS — I2510 Atherosclerotic heart disease of native coronary artery without angina pectoris: Secondary | ICD-10-CM | POA: Diagnosis present

## 2015-09-05 DIAGNOSIS — M109 Gout, unspecified: Secondary | ICD-10-CM | POA: Diagnosis present

## 2015-09-05 DIAGNOSIS — I739 Peripheral vascular disease, unspecified: Secondary | ICD-10-CM | POA: Diagnosis present

## 2015-09-05 DIAGNOSIS — I10 Essential (primary) hypertension: Secondary | ICD-10-CM | POA: Diagnosis present

## 2015-09-05 DIAGNOSIS — E119 Type 2 diabetes mellitus without complications: Secondary | ICD-10-CM | POA: Diagnosis present

## 2015-09-05 DIAGNOSIS — G8191 Hemiplegia, unspecified affecting right dominant side: Secondary | ICD-10-CM | POA: Diagnosis present

## 2015-09-05 DIAGNOSIS — Z7902 Long term (current) use of antithrombotics/antiplatelets: Secondary | ICD-10-CM | POA: Diagnosis not present

## 2015-09-05 DIAGNOSIS — G936 Cerebral edema: Secondary | ICD-10-CM | POA: Diagnosis present

## 2015-09-05 DIAGNOSIS — J9690 Respiratory failure, unspecified, unspecified whether with hypoxia or hypercapnia: Secondary | ICD-10-CM

## 2015-09-05 DIAGNOSIS — R4701 Aphasia: Secondary | ICD-10-CM | POA: Diagnosis present

## 2015-09-05 DIAGNOSIS — R479 Unspecified speech disturbances: Secondary | ICD-10-CM | POA: Diagnosis present

## 2015-09-05 HISTORY — PX: APPLICATION OF CRANIAL NAVIGATION: SHX6578

## 2015-09-05 HISTORY — PX: CRANIOTOMY: SHX93

## 2015-09-05 LAB — BLOOD GAS, ARTERIAL
ACID-BASE DEFICIT: 2.8 mmol/L — AB (ref 0.0–2.0)
BICARBONATE: 22.1 meq/L (ref 20.0–24.0)
Drawn by: 236041
FIO2: 0.6
LHR: 14 {breaths}/min
MECHVT: 550 mL
O2 SAT: 99 %
PATIENT TEMPERATURE: 98.6
PCO2 ART: 41.9 mmHg (ref 35.0–45.0)
PEEP/CPAP: 5 cmH2O
PH ART: 7.341 — AB (ref 7.350–7.450)
PO2 ART: 204 mmHg — AB (ref 80.0–100.0)
TCO2: 23.4 mmol/L (ref 0–100)

## 2015-09-05 SURGERY — CRANIOTOMY TUMOR EXCISION
Anesthesia: General | Site: Head | Laterality: Right

## 2015-09-05 MED ORDER — CHLORTHALIDONE 25 MG PO TABS
12.5000 mg | ORAL_TABLET | Freq: Two times a day (BID) | ORAL | Status: DC
  Filled 2015-09-05 (×3): qty 0.5

## 2015-09-05 MED ORDER — BUPIVACAINE HCL (PF) 0.5 % IJ SOLN
INTRAMUSCULAR | Status: DC | PRN
  Administered 2015-09-05: 5.5 mL

## 2015-09-05 MED ORDER — SODIUM CHLORIDE 0.9 % IV SOLN
INTRAVENOUS | Status: DC
  Administered 2015-09-05 – 2015-09-08 (×5): via INTRAVENOUS

## 2015-09-05 MED ORDER — SODIUM CHLORIDE 0.9 % IV SOLN
1000.0000 mg | INTRAVENOUS | Status: AC
  Administered 2015-09-05: 1000 mg via INTRAVENOUS
  Filled 2015-09-05: qty 10

## 2015-09-05 MED ORDER — SODIUM CHLORIDE 0.9 % IR SOLN
Status: DC | PRN
  Administered 2015-09-05: 18:00:00

## 2015-09-05 MED ORDER — PROPOFOL 10 MG/ML IV BOLUS
INTRAVENOUS | Status: AC
  Filled 2015-09-05: qty 20

## 2015-09-05 MED ORDER — DEXAMETHASONE SODIUM PHOSPHATE 10 MG/ML IJ SOLN
6.0000 mg | Freq: Four times a day (QID) | INTRAMUSCULAR | Status: AC
  Administered 2015-09-06 (×4): 6 mg via INTRAVENOUS
  Filled 2015-09-05 (×4): qty 1

## 2015-09-05 MED ORDER — DEXAMETHASONE SODIUM PHOSPHATE 4 MG/ML IJ SOLN
4.0000 mg | Freq: Four times a day (QID) | INTRAMUSCULAR | Status: AC
  Administered 2015-09-07 (×4): 4 mg via INTRAVENOUS
  Filled 2015-09-05 (×4): qty 1

## 2015-09-05 MED ORDER — FENTANYL CITRATE (PF) 250 MCG/5ML IJ SOLN
INTRAMUSCULAR | Status: AC
  Filled 2015-09-05: qty 5

## 2015-09-05 MED ORDER — PROMETHAZINE HCL 25 MG/ML IJ SOLN: 6.2500 mg | INTRAMUSCULAR | Status: DC | PRN

## 2015-09-05 MED ORDER — SENNA 8.6 MG PO TABS: 1.0000 | ORAL_TABLET | Freq: Two times a day (BID) | ORAL | Status: DC

## 2015-09-05 MED ORDER — DEXAMETHASONE SODIUM PHOSPHATE 4 MG/ML IJ SOLN
4.0000 mg | Freq: Three times a day (TID) | INTRAMUSCULAR | Status: DC
  Administered 2015-09-07 – 2015-09-09 (×5): 4 mg via INTRAVENOUS
  Filled 2015-09-05 (×5): qty 1

## 2015-09-05 MED ORDER — LIDOCAINE HCL (CARDIAC) 20 MG/ML IV SOLN
INTRAVENOUS | Status: DC | PRN
  Administered 2015-09-05: 50 mg via INTRAVENOUS

## 2015-09-05 MED ORDER — MANNITOL 25 % IV SOLN
INTRAVENOUS | Status: DC | PRN
  Administered 2015-09-05: 25 g via INTRAVENOUS

## 2015-09-05 MED ORDER — SODIUM CHLORIDE 0.9 % IV SOLN
INTRAVENOUS | Status: DC | PRN
  Administered 2015-09-05: 15:00:00 via INTRAVENOUS

## 2015-09-05 MED ORDER — HYDROCODONE-ACETAMINOPHEN 7.5-325 MG PO TABS
1.0000 | ORAL_TABLET | Freq: Once | ORAL | Status: DC | PRN
Start: 2015-09-05 — End: 2015-09-05

## 2015-09-05 MED ORDER — ATORVASTATIN CALCIUM 80 MG PO TABS: 80.0000 mg | ORAL_TABLET | Freq: Every day | ORAL | Status: DC

## 2015-09-05 MED ORDER — FENTANYL CITRATE (PF) 100 MCG/2ML IJ SOLN: 50.0000 ug | INTRAMUSCULAR | Status: DC | PRN

## 2015-09-05 MED ORDER — LABETALOL HCL 5 MG/ML IV SOLN
INTRAVENOUS | Status: DC | PRN
  Administered 2015-09-05 (×2): 10 mg via INTRAVENOUS

## 2015-09-05 MED ORDER — CHLORHEXIDINE GLUCONATE 0.12% ORAL RINSE (MEDLINE KIT)
15.0000 mL | Freq: Two times a day (BID) | OROMUCOSAL | Status: DC
  Administered 2015-09-05 – 2015-09-11 (×8): 15 mL via OROMUCOSAL

## 2015-09-05 MED ORDER — RAMIPRIL 10 MG PO CAPS
10.0000 mg | ORAL_CAPSULE | Freq: Two times a day (BID) | ORAL | Status: DC
  Filled 2015-09-05 (×3): qty 1

## 2015-09-05 MED ORDER — LIDOCAINE HCL (CARDIAC) 20 MG/ML IV SOLN
INTRAVENOUS | Status: AC
  Filled 2015-09-05: qty 5

## 2015-09-05 MED ORDER — ROCURONIUM BROMIDE 50 MG/5ML IV SOLN
INTRAVENOUS | Status: AC
  Filled 2015-09-05: qty 2

## 2015-09-05 MED ORDER — GUAIFENESIN ER 600 MG PO TB12
600.0000 mg | ORAL_TABLET | Freq: Two times a day (BID) | ORAL | Status: DC
Start: 2015-09-05 — End: 2015-09-06

## 2015-09-05 MED ORDER — THROMBIN 20000 UNITS EX SOLR
CUTANEOUS | Status: DC | PRN
  Administered 2015-09-05: 18:00:00 via TOPICAL

## 2015-09-05 MED ORDER — HEMOSTATIC AGENTS (NO CHARGE) OPTIME
TOPICAL | Status: DC | PRN
  Administered 2015-09-05 (×2): 1 via TOPICAL

## 2015-09-05 MED ORDER — SUCCINYLCHOLINE CHLORIDE 20 MG/ML IJ SOLN
INTRAMUSCULAR | Status: AC
  Filled 2015-09-05: qty 1

## 2015-09-05 MED ORDER — ONDANSETRON HCL 4 MG/2ML IJ SOLN
INTRAMUSCULAR | Status: AC
  Filled 2015-09-05: qty 2

## 2015-09-05 MED ORDER — AMLODIPINE BESYLATE 10 MG PO TABS: 10.0000 mg | ORAL_TABLET | Freq: Every day | ORAL | Status: DC

## 2015-09-05 MED ORDER — LABETALOL HCL 5 MG/ML IV SOLN: 10.0000 mg | INTRAVENOUS | Status: DC | PRN

## 2015-09-05 MED ORDER — PROPOFOL 500 MG/50ML IV EMUL
INTRAVENOUS | Status: DC | PRN
  Administered 2015-09-05: 25 ug/kg/min via INTRAVENOUS

## 2015-09-05 MED ORDER — PHENYLEPHRINE HCL 10 MG/ML IJ SOLN
10.0000 mg | INTRAMUSCULAR | Status: DC | PRN
  Administered 2015-09-05: 25 ug/min via INTRAVENOUS

## 2015-09-05 MED ORDER — ONDANSETRON HCL 4 MG/2ML IJ SOLN
4.0000 mg | INTRAMUSCULAR | Status: DC | PRN
  Administered 2015-09-07: 4 mg via INTRAVENOUS
  Filled 2015-09-05: qty 2

## 2015-09-05 MED ORDER — PROPOFOL 1000 MG/100ML IV EMUL
0.0000 ug/kg/min | INTRAVENOUS | Status: DC
  Administered 2015-09-06: 5.051 ug/kg/min via INTRAVENOUS
  Administered 2015-09-06 – 2015-09-07 (×2): 5 ug/kg/min via INTRAVENOUS
  Filled 2015-09-05 (×2): qty 100

## 2015-09-05 MED ORDER — MORPHINE SULFATE (PF) 2 MG/ML IV SOLN
1.0000 mg | INTRAVENOUS | Status: DC | PRN
  Administered 2015-09-09: 1 mg via INTRAVENOUS
  Filled 2015-09-05: qty 1

## 2015-09-05 MED ORDER — ROCURONIUM BROMIDE 50 MG/5ML IV SOLN
INTRAVENOUS | Status: AC
  Filled 2015-09-05: qty 1

## 2015-09-05 MED ORDER — LIDOCAINE-EPINEPHRINE 1 %-1:100000 IJ SOLN
INTRAMUSCULAR | Status: DC | PRN
  Administered 2015-09-05: 5.5 mL

## 2015-09-05 MED ORDER — PSEUDOEPHEDRINE HCL ER 120 MG PO TB12
120.0000 mg | ORAL_TABLET | Freq: Every day | ORAL | Status: DC
  Filled 2015-09-05: qty 1

## 2015-09-05 MED ORDER — DOCUSATE SODIUM 100 MG PO CAPS: 100.0000 mg | ORAL_CAPSULE | Freq: Two times a day (BID) | ORAL | Status: DC

## 2015-09-05 MED ORDER — PHENYLEPHRINE 40 MCG/ML (10ML) SYRINGE FOR IV PUSH (FOR BLOOD PRESSURE SUPPORT)
PREFILLED_SYRINGE | INTRAVENOUS | Status: AC
  Filled 2015-09-05: qty 10

## 2015-09-05 MED ORDER — 0.9 % SODIUM CHLORIDE (POUR BTL) OPTIME
TOPICAL | Status: DC | PRN
  Administered 2015-09-05 (×3): 1000 mL

## 2015-09-05 MED ORDER — EPHEDRINE SULFATE 50 MG/ML IJ SOLN
INTRAMUSCULAR | Status: AC
  Filled 2015-09-05: qty 1

## 2015-09-05 MED ORDER — CEFAZOLIN SODIUM 1-5 GM-% IV SOLN
1.0000 g | Freq: Three times a day (TID) | INTRAVENOUS | Status: AC
Start: 2015-09-06 — End: 2015-09-06
  Administered 2015-09-06 (×2): 1 g via INTRAVENOUS
  Filled 2015-09-05 (×2): qty 50

## 2015-09-05 MED ORDER — HYDROMORPHONE HCL 1 MG/ML IJ SOLN: 0.2500 mg | INTRAMUSCULAR | Status: DC | PRN

## 2015-09-05 MED ORDER — ANTISEPTIC ORAL RINSE SOLUTION (CORINZ)
7.0000 mL | OROMUCOSAL | Status: DC
  Administered 2015-09-05 – 2015-09-12 (×31): 7 mL via OROMUCOSAL

## 2015-09-05 MED ORDER — ONDANSETRON HCL 4 MG PO TABS: 4.0000 mg | ORAL_TABLET | ORAL | Status: DC | PRN

## 2015-09-05 MED ORDER — CARVEDILOL 6.25 MG PO TABS: 6.2500 mg | ORAL_TABLET | Freq: Two times a day (BID) | ORAL | Status: DC

## 2015-09-05 MED ORDER — SODIUM CHLORIDE 0.9 % IV SOLN
500.0000 mg | Freq: Two times a day (BID) | INTRAVENOUS | Status: DC
  Administered 2015-09-05 – 2015-09-12 (×14): 500 mg via INTRAVENOUS
  Filled 2015-09-05 (×17): qty 5

## 2015-09-05 MED ORDER — THROMBIN 5000 UNITS EX SOLR
CUTANEOUS | Status: DC | PRN
  Administered 2015-09-05: 5000 [IU] via TOPICAL

## 2015-09-05 MED ORDER — LACTATED RINGERS IV SOLN
INTRAVENOUS | Status: DC
  Administered 2015-09-05: 12:00:00 via INTRAVENOUS

## 2015-09-05 MED ORDER — PSEUDOEPHEDRINE-GUAIFENESIN ER 60-600 MG PO TB12: 1.0000 | ORAL_TABLET | Freq: Two times a day (BID) | ORAL | Status: DC

## 2015-09-05 MED ORDER — ROCURONIUM BROMIDE 100 MG/10ML IV SOLN
INTRAVENOUS | Status: DC | PRN
  Administered 2015-09-05 (×4): 20 mg via INTRAVENOUS
  Administered 2015-09-05: 10 mg via INTRAVENOUS
  Administered 2015-09-05: 20 mg via INTRAVENOUS
  Administered 2015-09-05: 50 mg via INTRAVENOUS

## 2015-09-05 MED ORDER — FENTANYL CITRATE (PF) 100 MCG/2ML IJ SOLN
INTRAMUSCULAR | Status: DC | PRN
  Administered 2015-09-05: 50 ug via INTRAVENOUS
  Administered 2015-09-05: 100 ug via INTRAVENOUS
  Administered 2015-09-05: 50 ug via INTRAVENOUS
  Administered 2015-09-05: 25 ug via INTRAVENOUS
  Administered 2015-09-05: 150 ug via INTRAVENOUS
  Administered 2015-09-05: 100 ug via INTRAVENOUS
  Administered 2015-09-05: 25 ug via INTRAVENOUS

## 2015-09-05 MED ORDER — PROMETHAZINE HCL 25 MG PO TABS: 12.5000 mg | ORAL_TABLET | ORAL | Status: DC | PRN

## 2015-09-05 MED ORDER — THROMBIN 5000 UNITS EX SOLR
OROMUCOSAL | Status: DC | PRN
  Administered 2015-09-05: 18:00:00 via TOPICAL

## 2015-09-05 MED ORDER — HYDRALAZINE HCL 20 MG/ML IJ SOLN
10.0000 mg | INTRAMUSCULAR | Status: DC | PRN
  Administered 2015-09-05: 10 mg via INTRAVENOUS
  Filled 2015-09-05: qty 1

## 2015-09-05 MED ORDER — ESOMEPRAZOLE MAGNESIUM 20 MG PO CPDR: 22.3000 mg | DELAYED_RELEASE_CAPSULE | Freq: Every day | ORAL | Status: DC

## 2015-09-05 MED ORDER — ALBUMIN HUMAN 5 % IV SOLN
INTRAVENOUS | Status: DC | PRN
  Administered 2015-09-05: 19:00:00 via INTRAVENOUS

## 2015-09-05 MED ORDER — PANTOPRAZOLE SODIUM 40 MG PO TBEC: 40.0000 mg | DELAYED_RELEASE_TABLET | Freq: Every day | ORAL | Status: DC

## 2015-09-05 MED ORDER — ALLOPURINOL 300 MG PO TABS: 300.0000 mg | ORAL_TABLET | Freq: Every day | ORAL | Status: DC

## 2015-09-05 MED ORDER — BACITRACIN ZINC 500 UNIT/GM EX OINT
TOPICAL_OINTMENT | CUTANEOUS | Status: DC | PRN
  Administered 2015-09-05 (×2): 1 via TOPICAL

## 2015-09-05 MED ORDER — PROPOFOL 10 MG/ML IV BOLUS
INTRAVENOUS | Status: DC | PRN
  Administered 2015-09-05: 50 mg via INTRAVENOUS
  Administered 2015-09-05: 120 mg via INTRAVENOUS
  Administered 2015-09-05: 100 mg via INTRAVENOUS
  Administered 2015-09-05: 30 mg via INTRAVENOUS

## 2015-09-05 SURGICAL SUPPLY — 109 items
9-0 ETHILON ×12 IMPLANT
BANDAGE GAUZE 4  KLING STR (GAUZE/BANDAGES/DRESSINGS) ×4 IMPLANT
BATTERY IQ STERILE (MISCELLANEOUS) ×4 IMPLANT
BENZOIN TINCTURE PRP APPL 2/3 (GAUZE/BANDAGES/DRESSINGS) IMPLANT
BLADE CLIPPER SURG (BLADE) ×4 IMPLANT
BLADE SAW GIGLI 16 STRL (MISCELLANEOUS) IMPLANT
BLADE SURG 15 STRL LF DISP TIS (BLADE) IMPLANT
BLADE SURG 15 STRL SS (BLADE)
BLADE ULTRA TIP 2M (BLADE) IMPLANT
BNDG GAUZE ELAST 4 BULKY (GAUZE/BANDAGES/DRESSINGS) ×8 IMPLANT
BRUSH SCRUB EZ 1% IODOPHOR (MISCELLANEOUS) IMPLANT
BUR ACORN 6.0 PRECISION (BURR) ×3 IMPLANT
BUR ACORN 6.0MM PRECISION (BURR) ×1
BUR ADDG 1.1 (BURR) ×3 IMPLANT
BUR ADDG 1.1MM (BURR) ×1
BUR MATCHSTICK NEURO 3.0 LAGG (BURR) IMPLANT
BUR ROUND FLUTED 4 SOFT TCH (BURR) ×3 IMPLANT
BUR ROUND FLUTED 4MM SOFT TCH (BURR) ×1
BUR SPIRAL ROUTER 2.3 (BUR) ×3 IMPLANT
BUR SPIRAL ROUTER 2.3MM (BUR) ×1
CANISTER SUCT 3000ML PPV (MISCELLANEOUS) ×12 IMPLANT
CATH VENTRIC 35X38 W/TROCAR LG (CATHETERS) IMPLANT
CLIP ANEURY TI PERM MINI STR 5 (Clip) ×4 IMPLANT
CLIP ANEURY TI PERM STD STR 9M (Clip) ×4 IMPLANT
CLIP TI MEDIUM 6 (CLIP) ×4 IMPLANT
CONT SPEC 4OZ CLIKSEAL STRL BL (MISCELLANEOUS) ×4 IMPLANT
COVER MAYO STAND STRL (DRAPES) IMPLANT
DECANTER SPIKE VIAL GLASS SM (MISCELLANEOUS) IMPLANT
DRAIN SNY WOU 7FLT (WOUND CARE) IMPLANT
DRAIN SUBARACHNOID (WOUND CARE) IMPLANT
DRAPE MICROSCOPE LEICA (MISCELLANEOUS) IMPLANT
DRAPE NEUROLOGICAL W/INCISE (DRAPES) ×4 IMPLANT
DRAPE PROXIMA HALF (DRAPES) ×8 IMPLANT
DRAPE STERI IOBAN 125X83 (DRAPES) IMPLANT
DRAPE SURG 17X23 STRL (DRAPES) IMPLANT
DRAPE WARM FLUID 44X44 (DRAPE) ×4 IMPLANT
DRSG ADAPTIC 3X8 NADH LF (GAUZE/BANDAGES/DRESSINGS) IMPLANT
DRSG TELFA 3X8 NADH (GAUZE/BANDAGES/DRESSINGS) ×4 IMPLANT
DURAMATRIX ONLAY 3X3 (Plate) ×8 IMPLANT
DURAPREP 6ML APPLICATOR 50/CS (WOUND CARE) ×4 IMPLANT
ELECT REM PT RETURN 9FT ADLT (ELECTROSURGICAL) ×4
ELECTRODE REM PT RTRN 9FT ADLT (ELECTROSURGICAL) ×2 IMPLANT
EVACUATOR 1/8 PVC DRAIN (DRAIN) IMPLANT
EVACUATOR SILICONE 100CC (DRAIN) IMPLANT
GAUZE SPONGE 4X4 12PLY STRL (GAUZE/BANDAGES/DRESSINGS) IMPLANT
GAUZE SPONGE 4X4 16PLY XRAY LF (GAUZE/BANDAGES/DRESSINGS) IMPLANT
GLOVE BIOGEL PI IND STRL 7.5 (GLOVE) ×2 IMPLANT
GLOVE BIOGEL PI INDICATOR 7.5 (GLOVE) ×2
GLOVE ECLIPSE 6.5 STRL STRAW (GLOVE) ×4 IMPLANT
GLOVE ECLIPSE 7.0 STRL STRAW (GLOVE) IMPLANT
GLOVE EXAM NITRILE LRG STRL (GLOVE) IMPLANT
GLOVE EXAM NITRILE MD LF STRL (GLOVE) IMPLANT
GLOVE EXAM NITRILE XL STR (GLOVE) IMPLANT
GLOVE EXAM NITRILE XS STR PU (GLOVE) IMPLANT
GOWN STRL REUS W/ TWL LRG LVL3 (GOWN DISPOSABLE) ×4 IMPLANT
GOWN STRL REUS W/ TWL XL LVL3 (GOWN DISPOSABLE) IMPLANT
GOWN STRL REUS W/TWL 2XL LVL3 (GOWN DISPOSABLE) IMPLANT
GOWN STRL REUS W/TWL LRG LVL3 (GOWN DISPOSABLE) ×4
GOWN STRL REUS W/TWL XL LVL3 (GOWN DISPOSABLE)
HEMOSTAT POWDER KIT SURGIFOAM (HEMOSTASIS) ×4 IMPLANT
HEMOSTAT POWDER SURGIFOAM 1G (HEMOSTASIS) ×8 IMPLANT
HEMOSTAT SURGICEL 2X14 (HEMOSTASIS) ×4 IMPLANT
KIT BASIN OR (CUSTOM PROCEDURE TRAY) ×4 IMPLANT
KIT DRAIN CSF ACCUDRAIN (MISCELLANEOUS) IMPLANT
KIT ROOM TURNOVER OR (KITS) ×4 IMPLANT
KNIFE ARACHNOID DISP AM-24-S (MISCELLANEOUS) ×4 IMPLANT
MARKER SPHERE PSV REFLC 13MM (MARKER) ×8 IMPLANT
NEEDLE HYPO 25X1 1.5 SAFETY (NEEDLE) ×4 IMPLANT
NEEDLE SPNL 18GX3.5 QUINCKE PK (NEEDLE) IMPLANT
NS IRRIG 1000ML POUR BTL (IV SOLUTION) ×8 IMPLANT
PACK CRANIOTOMY (CUSTOM PROCEDURE TRAY) ×4 IMPLANT
PAD EYE OVAL STERILE LF (GAUZE/BANDAGES/DRESSINGS) IMPLANT
PATTIES SURGICAL .25X.25 (GAUZE/BANDAGES/DRESSINGS) IMPLANT
PATTIES SURGICAL .5 X.5 (GAUZE/BANDAGES/DRESSINGS) ×4 IMPLANT
PATTIES SURGICAL .5 X3 (DISPOSABLE) IMPLANT
PATTIES SURGICAL 1/4 X 3 (GAUZE/BANDAGES/DRESSINGS) IMPLANT
PATTIES SURGICAL 1X1 (DISPOSABLE) ×4 IMPLANT
PIN MAYFIELD SKULL DISP (PIN) ×4 IMPLANT
PLATE 1.5  2HOLE LNG NEURO (Plate) ×4 IMPLANT
PLATE 1.5 2HOLE LNG NEURO (Plate) ×4 IMPLANT
PLATE 1.5 4HOLE LONG STRAIGHT (Plate) ×4 IMPLANT
PROBE FOR NEUROSURGERY (MISCELLANEOUS) ×4 IMPLANT
RUBBERBAND STERILE (MISCELLANEOUS) ×8 IMPLANT
SCREW SELF DRILL HT 1.5/4MM (Screw) ×24 IMPLANT
SET TUBING W/EXT DISP (INSTRUMENTS) ×4 IMPLANT
SPECIMEN JAR SMALL (MISCELLANEOUS) IMPLANT
SPONGE NEURO XRAY DETECT 1X3 (DISPOSABLE) IMPLANT
SPONGE SURGIFOAM ABS GEL 100 (HEMOSTASIS) ×4 IMPLANT
SPONGE SURGIFOAM ABS GEL SZ50 (HEMOSTASIS) ×4 IMPLANT
STAPLER VISISTAT 35W (STAPLE) ×8 IMPLANT
STOCKINETTE 6  STRL (DRAPES) ×2
STOCKINETTE 6 STRL (DRAPES) ×2 IMPLANT
SUT ETHILON 3 0 FSL (SUTURE) IMPLANT
SUT ETHILON 3 0 PS 1 (SUTURE) IMPLANT
SUT NURALON 4 0 TR CR/8 (SUTURE) ×8 IMPLANT
SUT SILK 0 TIES 10X30 (SUTURE) IMPLANT
SUT VIC AB 0 CT1 18XCR BRD8 (SUTURE) ×4 IMPLANT
SUT VIC AB 0 CT1 8-18 (SUTURE) ×4
SUT VIC AB 3-0 SH 8-18 (SUTURE) ×16 IMPLANT
TAPE CLOTH 1X10 TAN NS (GAUZE/BANDAGES/DRESSINGS) ×4 IMPLANT
TIP SONASTAR STD MISONIX 1.9 (TRAY / TRAY PROCEDURE) IMPLANT
TIP STRAIGHT 25KHZ (INSTRUMENTS) ×4 IMPLANT
TOWEL OR 17X24 6PK STRL BLUE (TOWEL DISPOSABLE) ×4 IMPLANT
TOWEL OR 17X26 10 PK STRL BLUE (TOWEL DISPOSABLE) ×4 IMPLANT
TRAY FOLEY W/METER SILVER 14FR (SET/KITS/TRAYS/PACK) IMPLANT
TUBE CONNECTING 12'X1/4 (SUCTIONS) ×1
TUBE CONNECTING 12X1/4 (SUCTIONS) ×3 IMPLANT
UNDERPAD 30X30 INCONTINENT (UNDERPADS AND DIAPERS) ×4 IMPLANT
WATER STERILE IRR 1000ML POUR (IV SOLUTION) ×4 IMPLANT

## 2015-09-05 NOTE — Anesthesia Preprocedure Evaluation (Signed)
Anesthesia Evaluation  Patient identified by MRN, date of birth, ID band Patient awake    Reviewed: Allergy & Precautions, NPO status , Patient's Chart, lab work & pertinent test results, reviewed documented beta blocker date and time   Airway Mallampati: II  TM Distance: >3 FB Neck ROM: Full    Dental  (+) Dental Advisory Given   Pulmonary former smoker,  Lung CA s/p resection    breath sounds clear to auscultation       Cardiovascular hypertension, Pt. on medications and Pt. on home beta blockers + CAD and + Cardiac Stents  + Valvular Problems/Murmurs (Nl LVF. Mod AS.) AS  Rhythm:Regular Rate:Normal     Neuro/Psych negative neurological ROS     GI/Hepatic Neg liver ROS, GERD  ,  Endo/Other  negative endocrine ROS  Renal/GU negative Renal ROS     Musculoskeletal  (+) Arthritis ,   Abdominal   Peds  Hematology negative hematology ROS (+)   Anesthesia Other Findings   Reproductive/Obstetrics                             Lab Results  Component Value Date   WBC 17.6* 09/02/2015   HGB 12.7* 09/02/2015   HCT 37.3* 09/02/2015   MCV 90.3 09/02/2015   PLT 318 09/02/2015   Lab Results  Component Value Date   CREATININE 1.04 09/02/2015   BUN 38* 09/02/2015   NA 138 09/02/2015   K 4.0 09/02/2015   CL 105 09/02/2015   CO2 22 09/02/2015    Anesthesia Physical Anesthesia Plan  ASA: III  Anesthesia Plan: General   Post-op Pain Management:    Induction: Intravenous  Airway Management Planned: Oral ETT  Additional Equipment: Arterial line, CVP and Ultrasound Guidance Line Placement  Intra-op Plan:   Post-operative Plan: Possible Post-op intubation/ventilation and Extubation in OR  Informed Consent: I have reviewed the patients History and Physical, chart, labs and discussed the procedure including the risks, benefits and alternatives for the proposed anesthesia with the patient  or authorized representative who has indicated his/her understanding and acceptance.   Dental advisory given  Plan Discussed with: CRNA and Surgeon  Anesthesia Plan Comments:         Anesthesia Quick Evaluation

## 2015-09-05 NOTE — Consult Note (Signed)
PULMONARY / CRITICAL CARE MEDICINE   Name: Jorge Maynard MRN: [id number] DOB: 1935/06/03    ADMISSION DATE:  09/05/2015 CONSULTATION DATE:  09/05/15  REFERRING MD:  Kathyrn Sheriff  CHIEF COMPLAINT:  Brain Tumor  HISTORY OF PRESENT ILLNESS:   [name] is a 81 y.o. 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 Per chart review, patient's wife has noticed some difficulty speaking over the past 2 weeks, including some word finding difficulties and word salad. Denies any recent headaches, visual changes, weakness, or paraesthesias.   MRI was performed on 09/04/15 which showed large left frontal peripherally enhancing mass lesion (68 mm x 51 mm) with surrounding edema, likely a metastatic tumor from known sarcoma.   Patient went for left craniotomy and resection today, sustained a left A2 branch avulsion intraoperatively, which was tamponaded and clipped proximal and distal to the lesion. Patient was also given intraoperative Propofol for burst suppression. Good hemostasis was achieved.   PCCM consulted for vent management.   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  PAST SURGICAL HISTORY: He  has past surgical history that includes Colonoscopy (N/A, 01/07/2015); Coronary angioplasty; Eye surgery (Bilateral); Lung cancer surgery; Rotator cuff repair (Right); Fracture surgery; Tendon repair (Left); and  Vasectomy.  No Known Allergies  No current facility-administered medications on file prior to encounter.   Current Outpatient Prescriptions on File Prior to Encounter  Medication Sig  . allopurinol (ZYLOPRIM) 300 MG tablet Take 300 mg by mouth daily.  Marland Kitchen amLODipine (NORVASC) 10 MG tablet Take 10 mg by mouth at bedtime.   Marland Kitchen aspirin 81 MG tablet Take 81 mg by mouth daily.  Marland Kitchen atorvastatin (LIPITOR) 80 MG tablet Take 80 mg by mouth at bedtime.   . carvedilol (COREG) 6.25 MG tablet Take 6.25 mg by mouth 2 (two) times daily with a meal.  . chlorthalidone (HYGROTON) 25 MG tablet Take 12.5 mg by mouth 2 (two) times daily.  . clopidogrel (PLAVIX) 75 MG tablet Take 75 mg by mouth every morning.  . Esomeprazole Magnesium (NEXIUM 24HR PO) Take 22.3 mg by mouth daily.  Marland Kitchen ibuprofen (ADVIL,MOTRIN) 200 MG tablet Take 400 mg by mouth every 6 (six) hours as needed for moderate pain.  . pseudoephedrine-guaifenesin (MUCINEX D) 60-600 MG 12 hr tablet Take 1 tablet by mouth every 12 (twelve) hours.  . ramipril (ALTACE) 10 MG capsule Take 10 mg by mouth 2 (two) times daily.  Marland Kitchen dexamethasone (DECADRON) 4 MG tablet 2 tablets twice a day  With food  and further instructions to follow ( pt will be seen 08/28/15 in office) (Patient not taking: Reported on 09/01/2015)    FAMILY HISTORY:  His indicated that his mother is deceased. He indicated that his father is deceased.   SOCIAL HISTORY: He  reports that he quit smoking about 50 years ago. He has never used smokeless tobacco. He reports that he drinks alcohol. He reports that he does not use illicit drugs.  REVIEW OF SYSTEMS:   Unable to obtain  SUBJECTIVE:  Opens eyes  to voice. Not following commands. Vitals stable, bradycardic (however this is chronic). No distress.   VITAL SIGNS: BP 177/50 mmHg  Pulse 48  Temp(Src) 97.3 F (36.3 C) (Axillary)  Resp 18  Wt 160 lb (72.576 kg)  SpO2 99%  HEMODYNAMICS:    VENTILATOR SETTINGS: Vent Mode:  [-]  PRVC FiO2 (%):  [60 %] 60 % Set Rate:  [14 bmp] 14 bmp Vt Set:  [550 mL] 550 mL PEEP:  [5 cmH20] 5 cmH20 Plateau Pressure:  [16 cmH20] 16 cmH20  INTAKE / OUTPUT: I/O last 3 completed shifts: In: -  Out: 1025 [Urine:750; Blood:275]  PHYSICAL EXAMINATION: General:  Elderly white male, sedated, on ventilator. NAD.  Neuro:  Opens eyes to voice. Not following commands. Pupils 4 mm, equal reactive.  HEENT:  PERRL. ETT in place. Moist mucus membranes.  Cardiovascular:  Bradycardic, regular, No murmurs.  Lungs:  Air entry equal bilaterally. Coarse breath sounds. No wheezes or rales.  Abdomen:  Soft, non-distended, BS +.  Musculoskeletal: No edema.  Skin:  No rashes.   LABS:  BMET  Recent Labs Lab 09/02/15 1130  NA 138  K 4.0  CL 105  CO2 22  BUN 38*  CREATININE 1.04  GLUCOSE 132*    Electrolytes  Recent Labs Lab 09/02/15 1130  CALCIUM 8.9    CBC  Recent Labs Lab 09/02/15 1130  WBC 17.6*  HGB 12.7*  HCT 37.3*  PLT 318    Coag's No results for input(s): APTT, INR in the last 168 hours.  Sepsis Markers No results for input(s): LATICACIDVEN, PROCALCITON, O2SATVEN in the last 168 hours.  ABG  Recent Labs Lab 09/05/15 2055  PHART 7.341*  PCO2ART 41.9  PO2ART 204*    Liver Enzymes No results for input(s): AST, ALT, ALKPHOS, BILITOT, ALBUMIN in the last 168 hours.  Cardiac Enzymes No results for input(s): TROPONINI, PROBNP in the last 168 hours.  Glucose No results for input(s): GLUCAP in the last 168 hours.  Imaging No results found.   STUDIES:  MRI brain 1/26 >> 6.8 cm left frontal lobe mass, most compatible with solitary metastasis. Moderate edema with 4 mm midline shift, unchanged.  CULTURES: None  ANTIBIOTICS: Ancef (surgical ppx) 1/27 >>   SIGNIFICANT EVENTS: 1/27  Left Craniotomy and mass resection with left A2 branch avulsion.   LINES/TUBES: ETT 1/27 >> RIJ CVL 1/27 >>   DISCUSSION: 81 y.o. 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 Sustained left A2 branch avulsion intraoperatively, hemostasis achieved.   ASSESSMENT / PLAN:  PULMONARY A: VDRF s/p Craniotomy/Mass Resection H/o Sarcoma w/ Lung Mets P:   Full Vent Support ABG noted Wean FiO2/PEEP for SpO2 >88% Daily SBT; plan for extubation in the AM CXR in AM  CARDIOVASCULAR A:  HTN H/o CAD Bradycardia; chronic P:  Hold ASA/Plavix given recent surgery Change Labetalol to Hydralazine 10 mg q4h prn for SBP 140-160, MAP 65-75 Hold home BP medications, hold Lipitor until extubation Caution with sedation medications given bradycardia Continue gentle hydration; NS @ 75 cc/hr  RENAL A:   No acute issues Gout P:   Check BMP in AM Resume Allopurinol in AM IVF as above  GASTROINTESTINAL A:   GERD Nutrition GI PPx P:   Protonix NPO for now If not extubated tomorrow, place  OGT for tube feeds  HEMATOLOGIC A:   Chronic Normocytic Anemia DVT PPx P:  Check CBC in AM SCD's; No Heparin/lovenox given recent CNS surgery  INFECTIOUS A:   No acute issues P:   Continue Ancef for surgical PPx  ENDOCRINE A:   No acute issues   P:   CBG's q4h while on steroids, NPO  NEUROLOGIC A:   Left Frontal Mass Lesion s/p Resection Perioperative Left A2 branch Avulsion  Sedation P:   RASS goal: -1 Management per neurosurgery Surgical Path pending Repeat MRI in AM Continue Keppra 500 mg bid Continue Dexamethasone for edema Propofol gtt, Fentanyl prn for sedation Strict BP control as above   FAMILY  - Updates: Family updated per neurosurgery 1/27.     Natasha Bence, MD PGY-3, Internal Medicine Pager: [phone]   09/05/2015, 10:05 PM   PCCM ADDENDUM   I have seen and evaluated the patient with Dr. Ronnald Ramp and agree  with his assessment and plan.  Briefly, this is a 81yo male with mmp including, HTN, CAD, poorly differentiated sarcoma with lung mets found to have a large left frontal metastasis that underwent neurosurgical resection today with A2 branch ACA avulsion.  PCCM is consulted for ventilator management which is currently acceptable.  On my exam: on propofol he follows some simple commands and moves his left arm and b/l LE.  No movement of right UE.  Head is bandaged and left eye is ecchymotic his pupils are 58m and reactive.  Lungs are clear b/l and abdomen is soft with normal bowel sounds.   Plan: - Maintain ET intubation - current settings are acceptable - PPI - oral care - NPO - sedation vacation and SBT in AM - CXR - Check CMP and cbc - NPO - SBP >110 and <140   - further neurologic plan per neurosurgery. - FC/FT  Total critical care time: 30 min  Critical care time was exclusive of separately billable procedures and treating other patients.  Critical care was necessary to treat or prevent imminent or life-threatening deterioration.  Critical care was time spent personally by me on the following activities: development of treatment plan with patient and/or surrogate as well as nursing, discussions with consultants, evaluation of patient's response to treatment, examination of patient, obtaining history from patient or surrogate, ordering and performing treatments and interventions, ordering and review of laboratory studies, ordering and review of radiographic studies, pulse oximetry and re-evaluation of patient's condition.   DMeribeth Mattes DO., MS [facility]

## 2015-09-05 NOTE — H&P (Signed)
CC:  Brain Tumor  HPI: Jorge Maynard is a 81 y.o. 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 He was also found to have lung metastases which were initially treated with surgical resection. He was then discovered to have more lung metastases which are treated with stereotactic radiosurgery. Over the last 2 weeks he and his wife have noted difficulty speaking. She says that he is trying to say words that don't belong in the middle of a sentence, and he even admits to sometimes speaking gibberish. He has not really had any headaches, visual changes, or noted any numbness tingling or weakness.   PMH: Past Medical History  Diagnosis Date  . Sarcoma ([place]) 12/31/2014  . Hypertension   . Coronary artery disease   . Peripheral vascular disease ([place])   . GERD (gastroesophageal reflux disease)   . Gout   . Aortic stenosis   . Carotid stenosis   . Pathologic myopia, both eyes   . Posterior staphyloma   . 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 Malignant neoplasm of skin   . Memory changes   . Enlarged prostate   . Arthritis   . Complication of anesthesia   . PONV (postoperative nausea and vomiting)   . Aortic stenosis     moderate on 12/2013 echo    PSH: Past Surgical History  Procedure Laterality Date  . Colonoscopy N/A 01/07/2015    Procedure: COLONOSCOPY;  Surgeon: Lollie Sails, MD;  [facility] ENDOSCOPY;  Service: Endoscopy;  Laterality: N/A;  . Coronary angioplasty      stents - 2 different times  . Eye surgery Bilateral     cataract surgery with lens implant  . Lung cancer surgery      2015   . Rotator cuff repair Right   . Fracture surgery    . Tendon repair Left     elbow  . Vasectomy      SH: Social History  Substance Use Topics  . Smoking status: Former Smoker    Quit date: 08/08/1965  . Smokeless tobacco: Never Used  . Alcohol Use: Yes     Comment: social    MEDS: Prior to Admission  medications   Medication Sig Start Date End Date Taking? Authorizing Provider  allopurinol (ZYLOPRIM) 300 MG tablet Take 300 mg by mouth daily.   Yes Historical Provider, MD  amLODipine (NORVASC) 10 MG tablet Take 10 mg by mouth at bedtime.    Yes Historical Provider, MD  aspirin 81 MG tablet Take 81 mg by mouth daily.   Yes Historical Provider, MD  atorvastatin (LIPITOR) 80 MG tablet Take 80 mg by mouth at bedtime.    Yes Historical Provider, MD  carvedilol (COREG) 6.25 MG tablet Take 6.25 mg by mouth 2 (two) times daily with a meal.   Yes Historical Provider, MD  chlorthalidone (HYGROTON) 25 MG tablet Take 12.5 mg by mouth 2 (two) times daily.   Yes Historical Provider, MD  clopidogrel (PLAVIX) 75 MG tablet Take 75 mg by mouth every morning.   Yes Historical Provider, MD  dexamethasone (DECADRON) 4 MG tablet Take 8 mg by mouth 2 (two) times daily.   Yes Historical Provider, MD  Esomeprazole Magnesium (NEXIUM 24HR PO) Take 22.3 mg by mouth daily.   Yes Historical Provider, MD  ibuprofen (ADVIL,MOTRIN) 200 MG tablet Take 400 mg by mouth every 6 (six) hours as needed for moderate pain.  Yes Historical Provider, MD  pseudoephedrine-guaifenesin (MUCINEX D) 60-600 MG 12 hr tablet Take 1 tablet by mouth every 12 (twelve) hours.   Yes Historical Provider, MD  ramipril (ALTACE) 10 MG capsule Take 10 mg by mouth 2 (two) times daily.   Yes Historical Provider, MD  dexamethasone (DECADRON) 4 MG tablet 2 tablets twice a day  With food  and further instructions to follow ( pt will be seen 08/28/15 in office) Patient not taking: Reported on 09/01/2015 08/27/15   Forest Gleason, MD    ALLERGY: No Known Allergies  ROS: ROS  NEUROLOGIC EXAM: Awake, alert, oriented Memory and concentration grossly intact Speech fluent, appropriate CN grossly intact Motor exam: Upper Extremities Deltoid Bicep Tricep Grip  Right 5/5 5/5 5/5 5/5  Left 5/5 5/5 5/5 5/5   Lower Extremity IP Quad PF DF EHL  Right 5/5 5/5 5/5  5/5 5/5  Left 5/5 5/5 5/5 5/5 5/5   Sensation grossly intact to LT  IMGAING: MRI demonstrates large left frontal peripherally enhancing tumor with surrounding edema  IMPRESSION: - 81 y.o. male with likely metastatic 6.8cm left frontal tumor  PLAN: - Proceed with left craniotomy for resection  I have discussed at length the imaging findings as well as the indications, risks, benefits, and alternatives to surgery. All questions were answered.

## 2015-09-05 NOTE — Transfer of Care (Signed)
Immediate Anesthesia Transfer of Care Note  Patient: Jorge Maynard  Procedure(s) Performed: Procedure(s) with comments: Stereotactic Right Craniotomy for resection of tumor with BrainLab (Right) - Stereotactic Right Craniotomy for resection of tumor with BrainLab APPLICATION OF CRANIAL NAVIGATION (N/A)  Patient Location: NICU  Anesthesia Type:General  Level of Consciousness: sedated, unresponsive and Patient remains intubated per anesthesia plan  Airway & Oxygen Therapy: Patient remains intubated per anesthesia plan and Patient placed on Ventilator (see vital sign flow sheet for setting)  Post-op Assessment: Report given to RN and Post -op Vital signs reviewed and stable  Post vital signs: Reviewed and stable  Last Vitals:  Filed Vitals:   09/05/15 1211  BP: 177/50  Pulse: 48  Temp: 36.3 C  Resp: 18    Complications: No apparent anesthesia complications

## 2015-09-05 NOTE — Op Note (Addendum)
PREOP DIAGNOSIS:  1. Metastatic Sarcoma 2. Brain tumor   POSTOP DIAGNOSIS: Same  PROCEDURE: 1. Stereotactic left frontotemporal craniotomy for resection of tumor 2. Use of intraoperative microscope for microdissection  SURGEON: Dr. Consuella Lose, MD  ASSISTANT: Dr. Cyndy Freeze, MD  ANESTHESIA: General Endotracheal  EBL: 300cc  SPECIMENS: left frontal tumor for permanent pathology  DRAINS: None  COMPLICATIONS:  Left frontal branch avulsed from left A2, with left A2 repaired primarily with 9-0 Nylon  CONDITION: Hemodynamically stable to ICU  HISTORY: Jorge Maynard is a 81 y.o. male initially presenting to his primary oncologist with a history of metastatic poorly differentiated sarcoma, and a recent history of headache and speech difficulty. CT scan was done which demonstrated a very large left frontal tumor. He was seen in the outpatient neurosurgery clinic, and MRI was ordered and surgery was scheduled. Risks and benefits of the surgery were discussed in detail with the patient and his wife. After all questions were answered, informed consent was obtained and witnessed.  PROCEDURE IN DETAIL: After informed consent was obtained and witnessed, the patient was brought to the operating room. After induction of general anesthesia, the patient was positioned on the operative table in the Mayfield headholder in the supine position. All pressure points were meticulously padded. Utilizing the preoperative stereotactic MRI scan, surface markers were co-registered until satisfactory accuracy was achieved. Skin incision was then marked out using the stereotactic system to allow access to the entire tumor, and the area was clipped, prepped and draped in the usual sterile fashion.  After timeout was conducted, skin incision was made sharply after infiltration with local anesthetic with epinephrine. Bovie electrocautery was used to dissect the subcutaneous tissue and the galea was  incised. Hemostasis was secured on skin edges using Raney clips. Temporalis muscle and fascia was then incised, and a single piece myocutaneous flap was elevated and reflected anteriorly and held with fishhooks. A standard frontotemporal craniotomy flap was then fashioned with the assistance of the stereotactic system so that the entire tumor could be visualized.  High-speed drill was used to drill down the lesser wing of the sphenoid. Dura was noted to be very thin, and was adherent to the undersurface of the bone. The dural opening was expanded and a curvilinear fashion, and reflected anteriorly and held with 4-0 Nurolon stitches.  at this point the microscope was draped sterilely and brought into the field, and the remainder of the case was done under the microscope using microdissection.   The tumor was based on the orbital roof, and it appeared to have a sulcal margin between it and the left frontal lobe. It was densely adherent to the frontal surface of the sylvian fissure. The arachnoid overlying the pial margin was cut, and utilizing a combination of bipolar electrocautery and ultrasonic aspirator, the tumor was dissected away from the frontal lobe in its posterior and superior margins initially. This dissection was carried more medially, until the medial margin of the tumor was identified at the interhemispheric fissure, and at the left frontal horn of the ventricle. In dissecting the tumor along this margin, the left frontal horn was entered.  At this point, the tumor was largely debulked with scissors, and monopolar cautery. Attention was then turned to dissection of the tumor away from the sylvian fissure, which was accomplished with microdissectors, and bipolar electrocautery. The optic nerve and optical carotid cistern were then opened, and the tumor was dissected away from the medial sylvian fissure, and across the frontal orbital  surface. We did make the decision to sacrifice the left olfactory  nerve in order to remove the tumor. Once the tumor was dissected away from the sylvian fissure and the frontal orbital surface, the remaining large piece of tumor was slowly lifted and removed.  In removing the last piece of tumor, a frontal branch was a false from the left A2, and brisk arterial bleeding was encountered. This was initially tamponaded with forceps, and more definitive control was obtained with placement of aneurysm clips both proximal and distal to the avulsion. A bolus of propofol was then administered by the anesthesia service to provide burst suppression.  The avulsion was easily identified on the anterior surface of the left A2. This hole was closed with 3 interrupted 9-0 Nylon stitches. Temporary clips were then removed, and no bleeding was identified from the avulsed section of the ACA.   At this point, the wound was irrigated with copious amounts of normal saline irrigation. Good hemostasis was confirmed in the resection cavity with a combination of bipolar electrocautery and morcellized Gelfoam with thrombin. A dural inlay and onlay was then placed with dura matrix. The bone was then replaced with standard titanium plates and screws. The temporalis fascia and muscle were then reapproximated using interrupted 0 Vicryl stitches, and the galea was closed with interrupted 3-0 Vicryl stitches. The skin was closed with skin staples. Mayfield head holder was then removed, and sterile dressing was applied. At the end of the case all sponge, needle, instrument, and cottonoid counts were correct. The patient was then transferred to the stretcher, and taken to the postanesthesia care unit in stable hemodynamic condition, while intubated.

## 2015-09-05 NOTE — Anesthesia Procedure Notes (Addendum)
Central Venous Catheter Insertion Performed by: anesthesiologist Preanesthetic checklist: patient identified, IV checked, site marked, risks and benefits discussed, surgical consent, monitors and equipment checked, pre-op evaluation, timeout performed and anesthesia consent Position: Trendelenburg Lidocaine 1% used for infiltration Landmarks identified and Seldinger technique used Catheter size: 7.5 Fr Central line was placed.Double lumen Procedure performed using ultrasound guided technique. Attempts: 1 Following insertion, line sutured, dressing applied and Biopatch. Post procedure assessment: blood return through all ports, free fluid flow and no air. Patient tolerated the procedure well with no immediate complications.   Procedure Name: Intubation Date/Time: 09/05/2015 3:45 PM Performed by: Eligha Bridegroom Pre-anesthesia Checklist: Emergency Drugs available, Patient identified, Suction available, Patient being monitored and Timeout performed Patient Re-evaluated:Patient Re-evaluated prior to inductionOxygen Delivery Method: Circle system utilized Preoxygenation: Pre-oxygenation with 100% oxygen Intubation Type: IV induction Ventilation: Mask ventilation without difficulty and Oral airway inserted - appropriate to patient size Laryngoscope Size: Mac and 4 Number of attempts: 1 Airway Equipment and Method: Stylet and LTA kit utilized Placement Confirmation: ETT inserted through vocal cords under direct vision and positive ETCO2 Secured at: 22 cm Tube secured with: Tape Dental Injury: Teeth and Oropharynx as per pre-operative assessment

## 2015-09-06 ENCOUNTER — Inpatient Hospital Stay (HOSPITAL_COMMUNITY): Payer: Medicare PPO

## 2015-09-06 DIAGNOSIS — Z9889 Other specified postprocedural states: Secondary | ICD-10-CM

## 2015-09-06 DIAGNOSIS — C7931 Secondary malignant neoplasm of brain: Principal | ICD-10-CM

## 2015-09-06 DIAGNOSIS — I10 Essential (primary) hypertension: Secondary | ICD-10-CM

## 2015-09-06 LAB — COMPREHENSIVE METABOLIC PANEL
ALBUMIN: 2.3 g/dL — AB (ref 3.5–5.0)
ALK PHOS: 91 U/L (ref 38–126)
ALT: 42 U/L (ref 17–63)
AST: 34 U/L (ref 15–41)
Anion gap: 10 (ref 5–15)
BILIRUBIN TOTAL: 0.4 mg/dL (ref 0.3–1.2)
BUN: 25 mg/dL — ABNORMAL HIGH (ref 6–20)
CALCIUM: 8 mg/dL — AB (ref 8.9–10.3)
CO2: 23 mmol/L (ref 22–32)
CREATININE: 0.92 mg/dL (ref 0.61–1.24)
Chloride: 107 mmol/L (ref 101–111)
Glucose, Bld: 136 mg/dL — ABNORMAL HIGH (ref 65–99)
Potassium: 4.1 mmol/L (ref 3.5–5.1)
SODIUM: 140 mmol/L (ref 135–145)
TOTAL PROTEIN: 5 g/dL — AB (ref 6.5–8.1)

## 2015-09-06 LAB — POCT I-STAT 7, (LYTES, BLD GAS, ICA,H+H)
Acid-base deficit: 2 mmol/L (ref 0.0–2.0)
Bicarbonate: 23 meq/L (ref 20.0–24.0)
Calcium, Ion: 1.03 mmol/L — ABNORMAL LOW (ref 1.13–1.30)
HCT: 29 % — ABNORMAL LOW (ref 39.0–52.0)
Hemoglobin: 9.9 g/dL — ABNORMAL LOW (ref 13.0–17.0)
O2 Saturation: 100 %
Patient temperature: 36.5
Potassium: 3.7 mmol/L (ref 3.5–5.1)
Sodium: 140 mmol/L (ref 135–145)
TCO2: 24 mmol/L (ref 0–100)
pCO2 arterial: 36.8 mmHg (ref 35.0–45.0)
pH, Arterial: 7.4 (ref 7.350–7.450)
pO2, Arterial: 325 mmHg — ABNORMAL HIGH (ref 80.0–100.0)

## 2015-09-06 LAB — GLUCOSE, CAPILLARY
GLUCOSE-CAPILLARY: 128 mg/dL — AB (ref 65–99)
GLUCOSE-CAPILLARY: 114 mg/dL — AB (ref 65–99)
Glucose-Capillary: 116 mg/dL — ABNORMAL HIGH (ref 65–99)
Glucose-Capillary: 112 mg/dL — ABNORMAL HIGH (ref 65–99)
Glucose-Capillary: 126 mg/dL — ABNORMAL HIGH (ref 65–99)

## 2015-09-06 LAB — CBC
HCT: 34.9 % — ABNORMAL LOW (ref 39.0–52.0)
Hemoglobin: 11.7 g/dL — ABNORMAL LOW (ref 13.0–17.0)
MCH: 30.5 pg (ref 26.0–34.0)
MCHC: 33.5 g/dL (ref 30.0–36.0)
MCV: 91.1 fL (ref 78.0–100.0)
PLATELETS: 261 10*3/uL (ref 150–400)
RBC: 3.83 MIL/uL — ABNORMAL LOW (ref 4.22–5.81)
RDW: 14.5 % (ref 11.5–15.5)
WBC: 22.5 10*3/uL — AB (ref 4.0–10.5)

## 2015-09-06 LAB — TRIGLYCERIDES: TRIGLYCERIDES: 110 mg/dL (ref <150)

## 2015-09-06 LAB — MRSA PCR SCREENING: MRSA BY PCR: NEGATIVE

## 2015-09-06 MED ORDER — GADOBENATE DIMEGLUMINE 529 MG/ML IV SOLN
15.0000 mL | Freq: Once | INTRAVENOUS | Status: AC | PRN
  Administered 2015-09-06: 15 mL via INTRAVENOUS

## 2015-09-06 MED ORDER — PANTOPRAZOLE SODIUM 40 MG IV SOLR
40.0000 mg | Freq: Every day | INTRAVENOUS | Status: DC
  Administered 2015-09-06 – 2015-09-09 (×4): 40 mg via INTRAVENOUS
  Filled 2015-09-06 (×4): qty 40

## 2015-09-06 NOTE — Progress Notes (Signed)
PULMONARY / CRITICAL CARE MEDICINE   Name: Jorge Maynard MRN: [id number] DOB: 1934-12-17    ADMISSION DATE:  09/05/2015 CONSULTATION DATE:  09/05/15  REFERRING MD:  Kathyrn Sheriff  CHIEF COMPLAINT:  Brain Tumor  STUDIES:  MRI brain 1/26: 6.8 cm left frontal lobe mass, most compatible with solitary metastasis. Moderate edema with 4 mm midline shift, unchanged. Port CXR 1/28: Rotated. ETT in good position. R IJ CVL in good position.  MICROBIOLOGY: MRSA PCR 1/27:  Negative   ANTIBIOTICS: Ancef (surgical ppx) 1/27 >>   SIGNIFICANT EVENTS: 1/27 -  Left Craniotomy and mass resection with left A2 branch avulsion.   LINES/TUBES: OETT 8.0 1/27 >> R IJ CVL 1/27 >> Foley 1/27>> PIV x3   SUBJECTIVE: No acute events overnight. Awaiting brain MRI.  REVIEW OF SYSTEMS:  Unable to obtain secondary to intubation.  VITAL SIGNS: BP 137/45 mmHg  Pulse 49  Temp(Src) 98.8 F (37.1 C) (Axillary)  Resp 6  Wt 160 lb (72.576 kg)  SpO2 100%  HEMODYNAMICS:    VENTILATOR SETTINGS: Vent Mode:  [-] CPAP;PSV FiO2 (%):  [40 %-60 %] 40 % Set Rate:  [14 bmp] 14 bmp Vt Set:  [550 mL] 550 mL PEEP:  [5 cmH20] 5 cmH20 Pressure Support:  [8 cmH20] 8 cmH20 Plateau Pressure:  [14 cmH20-16 cmH20] 14 cmH20  INTAKE / OUTPUT: I/O last 3 completed shifts: In: 2917.6 [I.V.:2512.6; IV Piggyback:405] Out: 3007 [Urine:1270; Blood:300]  PHYSICAL EXAMINATION: General:  Sedated. No acute distress. Wife and daughter at bedside.  Integument:  Warm & dry. No rash on exposed skin. Bruising noted above and over left eye. HEENT:  No scleral injection or icterus of right eye. Endotracheal tube in place. Periorbital edema around left eye.  Cardiovascular:  Regular rate. No edema. No appreciable JVD.  systolic ejection murmur noted.  Pulmonary:  Clear bilaterally to auscultation. Symmetric chest wall rise on ventilator. Abdomen: Soft. Normal bowel sounds. Nondistended.  Neurological: Family reports that patient was  moving his right arm earlier today. Sedated at this time.   LABS:  BMET  Recent Labs Lab 09/02/15 1130 09/05/15 1923 09/06/15 0500  NA 138 140 140  K 4.0 3.7 4.1  CL 105  --  107  CO2 22  --  23  BUN 38*  --  25*  CREATININE 1.04  --  0.92  GLUCOSE 132*  --  136*    Electrolytes  Recent Labs Lab 09/02/15 1130 09/06/15 0500  CALCIUM 8.9 8.0*    CBC  Recent Labs Lab 09/02/15 1130 09/05/15 1923 09/06/15 0500  WBC 17.6*  --  22.5*  HGB 12.7* 9.9* 11.7*  HCT 37.3* 29.0* 34.9*  PLT 318  --  261    Coag's No results for input(s): APTT, INR in the last 168 hours.  Sepsis Markers No results for input(s): LATICACIDVEN, PROCALCITON, O2SATVEN in the last 168 hours.  ABG  Recent Labs Lab 09/05/15 1923 09/05/15 2055  PHART 7.400 7.341*  PCO2ART 36.8 41.9  PO2ART 325.0* 204*    Liver Enzymes  Recent Labs Lab 09/06/15 0500  AST 34  ALT 42  ALKPHOS 91  BILITOT 0.4  ALBUMIN 2.3*    Cardiac Enzymes No results for input(s): TROPONINI, PROBNP in the last 168 hours.  Glucose  Recent Labs Lab 09/05/15 2325 09/06/15 0312 09/06/15 0748  GLUCAP 128* 114* 116*    Imaging Dg Chest Port 1 View  09/06/2015  CLINICAL DATA:  Check endotracheal intubation EXAM: PORTABLE CHEST 1 VIEW COMPARISON:  08/27/2015  FINDINGS: An endotracheal tube is now seen and lies 3.6 cm above the carina. A right jugular central line is noted with the tip at the cavoatrial junction. The lungs are well aerated bilaterally with the exception of the right upper lobe which demonstrates persistent changes similar to that seen on the prior CT examination. No new focal infiltrate is noted. IMPRESSION: Tubes and lines as described. Stable changes in the right upper lobe. Electronically Signed   By: Inez Catalina M.D.   On: 09/06/2015 07:21    ASSESSMENT / PLAN:  NEUROLOGIC A:   Left Frontal Mass Lesion s/p Resection Perioperative Left A2 branch Avulsion  Sedation needs on vent  P:    RASS goal: -1 Management per Neurosurgery Surgical Path pending Repeat MRI today Continue Keppra 500 mg bid Continue Dexamethasone for edema Propofol gtt, Fentanyl prn for sedation Strict BP control as above  PULMONARY A: VDRF s/p Craniotomy/Mass Resection H/O Sarcoma w/ Lung Mets  P:   Full Vent Support Wean FiO2/PEEP for SpO2 >88% Daily SBT; plan for extubation in the AM  CARDIOVASCULAR A:  HTN H/O CAD H/O Chronic Bradycardia  P:  Hold ASA/Plavix given recent surgery Hydralazine 10 mg IV q4h prn for SBP 140-160, MAP 65-75 Hold home BP medications Hold Lipitor Caution with sedation medications given bradycardia NS @ 75 cc/hr  RENAL A:   No acute issues  P:   Trending UOP with foley Monitoring renal function daily with BUN/Creatinine Electrolyte panel daily  GASTROINTESTINAL A:   H/O GERD  P:   Protonix IV NPO for now  HEMATOLOGIC A:   Chronic Normocytic Anemia  P:  Trending cell counts daily with CBC SCDs No Heparin/lovenox given recent CNS surgery  INFECTIOUS A:   No acute issues  P:   Continue Ancef for surgical PPx  ENDOCRINE A:   No acute issues    P:   CBG's q4h while on steroids, NPO  FAMILY  - Updates: Family updated by Dr. Ashok Cordia at bedside on 1/28.   TODAY'S SUMMARY:  81 year old w/ H/O HTN, CAD, & Sarcoma with lung mets. S/P surgical resection of left frontal met with A2 branch ACA avulsion. No further acute events overnight. Plan for MRI this afternoon. Plan for spontaneous breathing trial either this afternoon or first thing in the morning with possible extubation depending upon results of MRI.  I have spent a total of 35 minutes of critical care time this morning caring for the patient, discussing the plan of care with family at bedside, & reviewing the patient's electronic medical record.  Sonia Baller Ashok Cordia, M.D. [facility] Pulmonary & Critical Care Pager:  [phone] After 3pm or if no response, call  [phone] 09/06/2015, 12:00 PM

## 2015-09-06 NOTE — Progress Notes (Signed)
No acute post-operative issues MRI can't be performed until this afternoon because of contrasted MRI in previous 48 hours AVSS Intubated Arouses Intermittently following commands Right hemiparesis, left full strength Head wrap in place, c/d/i No apparent change since immediate post-op Would advise against extubating prior to MRI Continue supportive care

## 2015-09-06 NOTE — Progress Notes (Signed)
Initial Nutrition Assessment  DOCUMENTATION CODES:  Not applicable  INTERVENTION:  If unable to extubate and medically able, recommend TF:  Vital AF 1.2 @ 20 ml/hr via NG/OGT and increase by 10 ml every 4 hours to goal rate of 55 ml/hr.   30 ml Prostat daily.    Tube feeding regimen provides 1684 kcal  + 58 kcals from propofol (103% of needs), 114 grams of protein, and 1071 ml of H2O.   NUTRITION DIAGNOSIS:  Inadequate oral intake related to inability to eat as evidenced by NPO status.  GOAL:  Patient will meet greater than or equal to 90% of their needs  MONITOR:  Vent status, Labs, Weight trends, I & O's, Skin, Diet advancement  REASON FOR ASSESSMENT:  Ventilator    ASSESSMENT:  81 y/o male PMHx HTN, CAD, GERD, PVD and most significant Sarcoma with lung and recently discovered brain metastases who is POD#1 left craniotomy and resection of tumour.   Patient is currently intubated on ventilator support MV:11 L/min Temp (24hrs), Avg:98.1 F (36.7 C), Min:97.5 F (36.4 C), Max:98.8 F (37.1 C)  Propofol: 2.2 ml/hr = 58 kcals  Diet Order:  Diet NPO time specified  Skin:   Incision-skull  Last BM:  1/27  Height:  Ht Readings from Last 1 Encounters:  09/02/15 '5\' 8"'$  (1.727 m)   Weight:  Wt Readings from Last 1 Encounters:  09/05/15 160 lb (72.576 kg)   Wt Readings from Last 10 Encounters:  09/05/15 160 lb (72.576 kg)  09/02/15 160 lb 3.2 oz (72.666 kg)  08/28/15 161 lb 6 oz (73.2 kg)  08/25/15 162 lb 4.1 oz (73.6 kg)  08/07/15 164 lb 10.9 oz (74.7 kg)  05/29/15 163 lb 7.5 oz (74.15 kg)  05/22/15 164 lb 0.4 oz (74.4 kg)  05/13/15 164 lb 3.9 oz (74.5 kg)  01/07/15 158 lb (71.668 kg)  12/31/14 166 lb 14.2 oz (75.7 kg)   Ideal Body Weight:  70 kg  BMI:  Body mass index is 24.33 kg/(m^2).  Estimated Nutritional Needs:  Kcal:  4818 kcals Protein:  102-116 g Fluid:  Per MD  EDUCATION NEEDS:  No education needs identified at this time  Burtis Junes RD,  LDN Nutrition Pager: [phone] 09/06/2015 3:14 PM

## 2015-09-07 DIAGNOSIS — J9600 Acute respiratory failure, unspecified whether with hypoxia or hypercapnia: Secondary | ICD-10-CM

## 2015-09-07 DIAGNOSIS — D72829 Elevated white blood cell count, unspecified: Secondary | ICD-10-CM

## 2015-09-07 LAB — CBC WITH DIFFERENTIAL/PLATELET
BASOS ABS: 0 10*3/uL (ref 0.0–0.1)
Basophils Relative: 0 %
EOS PCT: 0 %
Eosinophils Absolute: 0 10*3/uL (ref 0.0–0.7)
HEMATOCRIT: 34 % — AB (ref 39.0–52.0)
HEMOGLOBIN: 11.6 g/dL — AB (ref 13.0–17.0)
LYMPHS ABS: 1.4 10*3/uL (ref 0.7–4.0)
LYMPHS PCT: 5 %
MCH: 31.1 pg (ref 26.0–34.0)
MCHC: 34.1 g/dL (ref 30.0–36.0)
MCV: 91.2 fL (ref 78.0–100.0)
MONOS PCT: 4 %
Monocytes Absolute: 1.1 10*3/uL — ABNORMAL HIGH (ref 0.1–1.0)
Neutro Abs: 24.6 10*3/uL — ABNORMAL HIGH (ref 1.7–7.7)
Neutrophils Relative %: 91 %
Platelets: 204 10*3/uL (ref 150–400)
RBC: 3.73 MIL/uL — AB (ref 4.22–5.81)
RDW: 14.6 % (ref 11.5–15.5)
WBC: 27.1 10*3/uL — AB (ref 4.0–10.5)

## 2015-09-07 LAB — GLUCOSE, CAPILLARY
GLUCOSE-CAPILLARY: 126 mg/dL — AB (ref 65–99)
GLUCOSE-CAPILLARY: 130 mg/dL — AB (ref 65–99)
GLUCOSE-CAPILLARY: 131 mg/dL — AB (ref 65–99)
GLUCOSE-CAPILLARY: 131 mg/dL — AB (ref 65–99)
GLUCOSE-CAPILLARY: 144 mg/dL — AB (ref 65–99)
GLUCOSE-CAPILLARY: 150 mg/dL — AB (ref 65–99)
Glucose-Capillary: 143 mg/dL — ABNORMAL HIGH (ref 65–99)

## 2015-09-07 LAB — RENAL FUNCTION PANEL
ALBUMIN: 2.1 g/dL — AB (ref 3.5–5.0)
ANION GAP: 7 (ref 5–15)
BUN: 18 mg/dL (ref 6–20)
CHLORIDE: 106 mmol/L (ref 101–111)
CO2: 26 mmol/L (ref 22–32)
Calcium: 8.1 mg/dL — ABNORMAL LOW (ref 8.9–10.3)
Creatinine, Ser: 0.74 mg/dL (ref 0.61–1.24)
Glucose, Bld: 149 mg/dL — ABNORMAL HIGH (ref 65–99)
PHOSPHORUS: 2.3 mg/dL — AB (ref 2.5–4.6)
POTASSIUM: 3.8 mmol/L (ref 3.5–5.1)
Sodium: 139 mmol/L (ref 135–145)

## 2015-09-07 LAB — TRIGLYCERIDES: TRIGLYCERIDES: 92 mg/dL (ref <150)

## 2015-09-07 LAB — MAGNESIUM: MAGNESIUM: 2.1 mg/dL (ref 1.7–2.4)

## 2015-09-07 MED ORDER — SODIUM PHOSPHATE 3 MMOLE/ML IV SOLN
10.0000 mmol | Freq: Once | INTRAVENOUS | Status: AC
  Administered 2015-09-07: 10 mmol via INTRAVENOUS
  Filled 2015-09-07: qty 3.33

## 2015-09-07 NOTE — Progress Notes (Signed)
PULMONARY / CRITICAL CARE MEDICINE   Name: Jorge Maynard MRN: [id number] DOB: 05-21-1935    ADMISSION DATE:  09/05/2015 CONSULTATION DATE:  09/05/15  REFERRING MD:  Kathyrn Sheriff  CHIEF COMPLAINT:  Brain Tumor  Brief Hx: 81 year old w/ H/O HTN, CAD, & Sarcoma with lung mets. S/P surgical resection of left frontal met with A2 branch ACA avulsion on 1/27.  Left intubated overnight, PCCM for vent mgmt.   SUBJECTIVE: No acute events overnight. MRI completed late yesterday.  Tol PS 5/5 with Vt 650.    REVIEW OF SYSTEMS:  Unable to obtain secondary to intubation.  VITAL SIGNS: BP 142/52 mmHg  Pulse 51  Temp(Src) 98 F (36.7 C) (Axillary)  Resp 15  Wt 72.576 kg (160 lb)  SpO2 99%  HEMODYNAMICS:    VENTILATOR SETTINGS: Vent Mode:  [-] PSV FiO2 (%):  [40 %] 40 % Set Rate:  [14 bmp] 14 bmp Vt Set:  [550 mL] 550 mL PEEP:  [5 cmH20] 5 cmH20 Pressure Support:  [5 cmH20-8 cmH20] 5 cmH20 Plateau Pressure:  [9 cmH20-16 cmH20] 9 cmH20  INTAKE / OUTPUT: I/O last 3 completed shifts: In: 5030.4 [I.V.:4365.4; IV Piggyback:665] Out: 2500 [Urine:3240; Blood:25]  PHYSICAL EXAMINATION: General:  Drowsy but arousable. No acute distress. Wife and daughter at bedside.  Integument:  Warm & dry. No rash on exposed skin. Bruising/swelling noted over left eye. HEENT:  No scleral injection or icterus of right eye. Endotracheal tube in place. Periorbital edema around left eye.  Cardiovascular:  Regular rate. No edema. No appreciable JVD.  4/6 systolic murmur noted.  Pulmonary:  resps even non labored on PS 5/5, RR 18, Vt 650. Clear bilaterally to auscultation. Abdomen: Soft. Normal bowel sounds. Nondistended.  Neurological: drowsy but arousable (propofol turned off during exam), follows commands, R sided weakness   LABS:  BMET  Recent Labs Lab 09/02/15 1130 09/05/15 1923 09/06/15 0500 09/07/15 0530  NA 138 140 140 139  K 4.0 3.7 4.1 3.8  CL 105  --  107 106  CO2 22  --  23 26  BUN 38*   --  25* 18  CREATININE 1.04  --  0.92 0.74  GLUCOSE 132*  --  136* 149*    Electrolytes  Recent Labs Lab 09/02/15 1130 09/06/15 0500 09/07/15 0530  CALCIUM 8.9 8.0* 8.1*  MG  --   --  2.1  PHOS  --   --  2.3*    CBC  Recent Labs Lab 09/02/15 1130 09/05/15 1923 09/06/15 0500 09/07/15 0530  WBC 17.6*  --  22.5* 27.1*  HGB 12.7* 9.9* 11.7* 11.6*  HCT 37.3* 29.0* 34.9* 34.0*  PLT 318  --  261 204    Coag's No results for input(s): APTT, INR in the last 168 hours.  Sepsis Markers No results for input(s): LATICACIDVEN, PROCALCITON, O2SATVEN in the last 168 hours.  ABG  Recent Labs Lab 09/05/15 1923 09/05/15 2055  PHART 7.400 7.341*  PCO2ART 36.8 41.9  PO2ART 325.0* 204*    Liver Enzymes  Recent Labs Lab 09/06/15 0500 09/07/15 0530  AST 34  --   ALT 42  --   ALKPHOS 91  --   BILITOT 0.4  --   ALBUMIN 2.3* 2.1*    Cardiac Enzymes No results for input(s): TROPONINI, PROBNP in the last 168 hours.  Glucose  Recent Labs Lab 09/06/15 0312 09/06/15 0748 09/06/15 1133 09/06/15 2001 09/07/15 0038 09/07/15 0314  GLUCAP 114* 116* 112* 126* 143* 144*  Imaging Mr Jeri Cos HW Contrast  09/06/2015  CLINICAL DATA:  Status post resection of large left frontal metastasis. Follow-up. EXAM: MRI HEAD WITHOUT AND WITH CONTRAST TECHNIQUE: Multiplanar, multiecho pulse sequences of the brain and surrounding structures were obtained without and with intravenous contrast. CONTRAST:  75m MULTIHANCE GADOBENATE DIMEGLUMINE 529 MG/ML IV SOLN COMPARISON:  09/04/2015 FINDINGS: There is been left frontal craniotomy for resection of a a nearly 7 cm left frontal lobe metastasis. There is peri-resection infarction along the posterior and superior margin of the resection, with involvement of the left caudate. The post resection cavity is filled with fluid, some blood products and air. Left frontal horn may communicate with the resection cavity. There is extradural fluid and air  measuring 2 cm in thickness. There is 2 mm of left-to-right midline shift. I do not see any evidence of residual hyper enhancing tumor. This would appear to represent a complete resection. Small amount of blood dependent within the lateral ventricles. Chronic small-vessel ischemic changes are present within the cerebral hemispheric white matter bilaterally. No hydrocephalus. IMPRESSION: Apparent total resection of a large left frontal metastatic lesion. Post resective space is filled with fluid, blood and air. Extradural fluid and air measuring 2.1 cm in thickness. Mild mass effect with left-to-right shift of 2 mm. Small amount of peri-resection infarction along the posterior and superior margin including involvement of the caudate head. Frontal horn of the left lateral ventricle may communicate with the resection cavity. Electronically Signed   By: MNelson ChimesM.D.   On: 09/06/2015 17:31   STUDIES:  MRI brain 1/26: 6.8 cm left frontal lobe mass, most compatible with solitary metastasis. Moderate edema with 4 mm midline shift, unchanged. Port CXR 1/28: Rotated. ETT in good position. R IJ CVL in good position. MRI brain 1/28>>>Apparent total resection of a large left frontal metastatic lesion.  Post resective space is filled with fluid, blood and air. Extradural fluid and air measuring 2.1 cm in thickness. Mild mass effect with left-to-right shift of 2 mm. Small amount of peri-resection infarction along the posterior and superior margin including involvement of the caudate head. Frontal horn of the left lateral ventricle may communicate with the resection cavity.  MICROBIOLOGY: MRSA PCR 1/27:  Negative   ANTIBIOTICS: Ancef (surgical ppx) 1/27 >>   SIGNIFICANT EVENTS: 1/27 -  Left Craniotomy and mass resection with left A2 branch avulsion.   LINES/TUBES: OETT 8.0 1/27 >> R IJ CVL 1/27 >> Foley 1/27>> PIV x3   ASSESSMENT / PLAN:  NEUROLOGIC A:   Left Frontal Mass Lesion s/p  Resection Perioperative Left A2 branch Avulsion  Sedation needs on vent P:   Management per Neurosurgery Surgical Path pending Continue Keppra 500 mg bid Continue Dexamethasone for edema Strict BP control as below   PULMONARY A: VDRF s/p Craniotomy/Mass Resection H/O Sarcoma w/ Lung Mets P:   Extubate  Pulmonary hygiene  Supplemental O2 as needed to keep sats >92%  CARDIOVASCULAR A:  HTN H/O CAD H/O Chronic Bradycardia P:  Hold ASA/Plavix given recent surgery PRN Hydralazine q4h for goal SBP 140-160, MAP 65-75 Hold home BP medications Hold Lipitor NS @ 75 cc/hr  RENAL A:   No acute issues P:   Trending UOP with foley Monitoring renal function daily with BUN/Creatinine Electrolyte panel daily Replete Phos 1/29  GASTROINTESTINAL A:   H/O GERD P:   Protonix IV NPO for now  HEMATOLOGIC A:   Chronic Normocytic Anemia P:  Trending cell counts daily with CBC SCDs No Heparin/lovenox  given recent CNS surgery  INFECTIOUS A:   Leukocytosis  P:   Continue Ancef for surgical PPx  ENDOCRINE A:   No acute issues    P:   CBG's q4h while on steroids, NPO  FAMILY  - Updates: Family updated by at bedside on 1/29.    Nickolas Madrid, NP 09/07/2015  8:27 AM Pager: [phone] or [phone]

## 2015-09-07 NOTE — Progress Notes (Signed)
No acute events AVSS Awake and alert Talking Follows bilaterally with full strength on left, hemiparetic on right MRI looks good, no large infarcts Stable Transfer to floor this afternoon if stable

## 2015-09-07 NOTE — Anesthesia Postprocedure Evaluation (Signed)
Anesthesia Post Note  Patient: Jorge Maynard  Procedure(s) Performed: Procedure(s) (LRB): Stereotactic Right Craniotomy for resection of tumor with BrainLab (Right) APPLICATION OF CRANIAL NAVIGATION (N/A)  Patient location during evaluation: PACU Anesthesia Type: General Level of consciousness: awake Pain management: pain level controlled Vital Signs Assessment: post-procedure vital signs reviewed and stable Respiratory status: patient remains intubated per anesthesia plan Cardiovascular status: stable Anesthetic complications: no    Last Vitals:  Filed Vitals:   09/07/15 1145 09/07/15 1200  BP:  151/57  Pulse:  58  Temp: 36.7 C   Resp:  18    Last Pain: There were no vitals filed for this visit.               EDWARDS,Horst Ostermiller

## 2015-09-07 NOTE — Procedures (Signed)
Extubation Procedure Note  Patient Details:   Name: Jorge Maynard DOB: 15-Oct-1934 MRN: [id number]   Airway Documentation:  Airway (Active)    Evaluation  O2 sats: stable throughout Complications: No apparent complications Patient did tolerate procedure well. Bilateral Breath Sounds: Rhonchi   Yes   Pt extubated to [place], alert, oreinted, & able to speak   Cordella Register 09/07/2015, 9:13 AM

## 2015-09-08 ENCOUNTER — Encounter (HOSPITAL_COMMUNITY): Payer: Self-pay | Admitting: Neurosurgery

## 2015-09-08 DIAGNOSIS — R5381 Other malaise: Secondary | ICD-10-CM

## 2015-09-08 DIAGNOSIS — I350 Nonrheumatic aortic (valve) stenosis: Secondary | ICD-10-CM

## 2015-09-08 DIAGNOSIS — D62 Acute posthemorrhagic anemia: Secondary | ICD-10-CM

## 2015-09-08 DIAGNOSIS — D72829 Elevated white blood cell count, unspecified: Secondary | ICD-10-CM | POA: Insufficient documentation

## 2015-09-08 DIAGNOSIS — I2510 Atherosclerotic heart disease of native coronary artery without angina pectoris: Secondary | ICD-10-CM

## 2015-09-08 LAB — GLUCOSE, CAPILLARY
GLUCOSE-CAPILLARY: 193 mg/dL — AB (ref 65–99)
GLUCOSE-CAPILLARY: 156 mg/dL — AB (ref 65–99)
Glucose-Capillary: 136 mg/dL — ABNORMAL HIGH (ref 65–99)
Glucose-Capillary: 120 mg/dL — ABNORMAL HIGH (ref 65–99)
Glucose-Capillary: 137 mg/dL — ABNORMAL HIGH (ref 65–99)

## 2015-09-08 LAB — RENAL FUNCTION PANEL
ANION GAP: 7 (ref 5–15)
Albumin: 1.8 g/dL — ABNORMAL LOW (ref 3.5–5.0)
BUN: 17 mg/dL (ref 6–20)
CHLORIDE: 106 mmol/L (ref 101–111)
CO2: 28 mmol/L (ref 22–32)
CREATININE: 0.7 mg/dL (ref 0.61–1.24)
Calcium: 8.2 mg/dL — ABNORMAL LOW (ref 8.9–10.3)
Glucose, Bld: 144 mg/dL — ABNORMAL HIGH (ref 65–99)
POTASSIUM: 4 mmol/L (ref 3.5–5.1)
Phosphorus: 2.4 mg/dL — ABNORMAL LOW (ref 2.5–4.6)
Sodium: 141 mmol/L (ref 135–145)

## 2015-09-08 LAB — CBC WITH DIFFERENTIAL/PLATELET
Basophils Absolute: 0 10*3/uL (ref 0.0–0.1)
Basophils Relative: 0 %
EOS ABS: 0 10*3/uL (ref 0.0–0.7)
Eosinophils Relative: 0 %
HEMATOCRIT: 33 % — AB (ref 39.0–52.0)
HEMOGLOBIN: 10.9 g/dL — AB (ref 13.0–17.0)
LYMPHS ABS: 0.7 10*3/uL (ref 0.7–4.0)
LYMPHS PCT: 4 %
MCH: 30.1 pg (ref 26.0–34.0)
MCHC: 33 g/dL (ref 30.0–36.0)
MCV: 91.2 fL (ref 78.0–100.0)
MONOS PCT: 6 %
Monocytes Absolute: 1.2 10*3/uL — ABNORMAL HIGH (ref 0.1–1.0)
NEUTROS ABS: 18.2 10*3/uL — AB (ref 1.7–7.7)
NEUTROS PCT: 90 %
Platelets: 174 10*3/uL (ref 150–400)
RBC: 3.62 MIL/uL — AB (ref 4.22–5.81)
RDW: 14.5 % (ref 11.5–15.5)
WBC: 20 10*3/uL — AB (ref 4.0–10.5)

## 2015-09-08 LAB — MAGNESIUM: MAGNESIUM: 2.2 mg/dL (ref 1.7–2.4)

## 2015-09-08 LAB — TRIGLYCERIDES: Triglycerides: 79 mg/dL (ref <150)

## 2015-09-08 MED ORDER — CHLORHEXIDINE GLUCONATE 0.12 % MT SOLN
OROMUCOSAL | Status: AC
  Filled 2015-09-08: qty 15

## 2015-09-08 NOTE — Progress Notes (Signed)
Nutrition Follow-up  INTERVENTION:  No further intervention warranted at this time   NUTRITION DIAGNOSIS:   Inadequate oral intake related to inability to eat as evidenced by NPO status.  Discontinued  GOAL:   Patient will meet greater than or equal to 90% of their needs  Being Met  MONITOR:   Vent status, Labs, Weight trends, I & O's, Skin, Diet advancement  REASON FOR ASSESSMENT:   Ventilator    ASSESSMENT:   81 y/o male PMHx HTN, CAD, GERD, PVD and most significant Sarcoma with lung and recently discovered brain metastases who is POD#3 left craniotomy and resection of tumour.   Pt was extubated on 1/29 and diet was advanced to clear liquids later that day; now on regular diet. Pt states that his appetite is good and he is eating 100% of meals. He reports eating well PTA and denies any recent unintentional weight loss. Pt denies any nutrition related questions or concerns at this time.   Labs: low calcium, low hemoglobin  Diet Order:  Diet regular Room service appropriate?: Yes; Fluid consistency:: Thin  Skin:  Wound (see comment) (closed head incision)  Last BM:  1/27  Height:   Ht Readings from Last 1 Encounters:  09/02/15 5' 8"  (1.727 m)    Weight:   Wt Readings from Last 1 Encounters:  09/05/15 160 lb (72.576 kg)    Ideal Body Weight:  70 kg  BMI:  Body mass index is 24.33 kg/(m^2).  Estimated Nutritional Needs:   Kcal:  1900-2100  Protein:  90-100 grams  Fluid:  1.9-2.1 L/day  EDUCATION NEEDS:   No education needs identified at this time  [place], LDN Inpatient Clinical Dietitian Pager: [phone] After Hours Pager: [phone]

## 2015-09-08 NOTE — Evaluation (Signed)
Physical Therapy Evaluation Patient Details Name: Jorge Maynard MRN: [id number] DOB: April 21, 1935 Today's Date: 09/08/2015   History of Present Illness  Jorge Maynard is a 81 y.o. 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 He was also found to have lung metastases which were initially treated with surgical resection. He was then discovered to have more lung metastases which are treated with stereotactic radiosurgery. Over the last 2 weeks he and his wife have noted difficulty speaking. Pt underwent left frontotemporal craniotomy for tumor resection.  Clinical Impression  Pt admitted with above diagnosis. Pt currently with functional limitations due to the deficits listed below (see PT Problem List). Pt performed bed mobility and transfers with mod A +2, demonstrated delays in processing as well as right sided weakness and apraxia.  Pt will benefit from skilled PT to increase their independence and safety with mobility to allow discharge to the venue listed below.       Follow Up Recommendations CIR    Equipment Recommendations  Other (comment) (TBD)    Recommendations for Other Services Rehab consult     Precautions / Restrictions Precautions Precautions: Fall Restrictions Weight Bearing Restrictions: No      Mobility  Bed Mobility Overal bed mobility: Needs Assistance;+2 for physical assistance Bed Mobility: Supine to Sit     Supine to sit: +2 for physical assistance;Mod assist     General bed mobility comments: +2 mod A for problem solving through motion as well as for stabilizing throughout  Transfers Overall transfer level: Needs assistance Equipment used: 2 person hand held assist Transfers: Sit to/from Stand;Stand Pivot Transfers Sit to Stand: +2 physical assistance;Mod assist Stand pivot transfers: Mod assist;+2 physical assistance       General transfer comment: pt unable to move right leg with  first standing, needed mod A +2 for wt shift as well as facilitation of right step. Though as he progressed, was able to advance RLE short distance with wt shift assistance  Ambulation/Gait Ambulation/Gait assistance: Mod assist;+2 physical assistance Ambulation Distance (Feet): 4 Feet (2' fwd, 2' bkwd) Assistive device: 2 person hand held assist Gait Pattern/deviations: Step-to pattern;Decreased weight shift to right;Decreased step length - right;Decreased stance time - right Gait velocity: decreased Gait velocity interpretation: <1.8 ft/sec, indicative of risk for recurrent falls General Gait Details: manual facilitation at right hip and foot for RLE stepping fwd and bkwd. Mod A +2 for support in standing  Stairs            Wheelchair Mobility    Modified Rankin (Stroke Patients Only)       Balance Overall balance assessment: Needs assistance Sitting-balance support: Bilateral upper extremity supported;Feet supported Sitting balance-Leahy Scale: Poor Sitting balance - Comments: mod A to maintain sitting EOB due to post lean Postural control: Posterior lean Standing balance support: Bilateral upper extremity supported Standing balance-Leahy Scale: Poor                               Pertinent Vitals/Pain Pain Assessment: No/denies pain    Home Living Family/patient expects to be discharged to:: Private residence Living Arrangements: Spouse/significant other Available Help at Discharge: Family;Available 24 hours/day Type of Home: House Home Access: Stairs to enter Entrance Stairs-Rails: None Entrance Stairs-Number of Steps: 3 Home Layout: Two level;Able to live on main level with bedroom/bathroom   Additional Comments: wife was an Therapist, sports, pt has  engineering background    Prior Function Level of Independence: Independent               Hand Dominance   Dominant Hand: Right    Extremity/Trunk Assessment   Upper Extremity Assessment: Defer to OT  evaluation           Lower Extremity Assessment: Generalized weakness;Difficult to assess due to impaired cognition      Cervical / Trunk Assessment: Kyphotic  Communication   Communication: Receptive difficulties;Expressive difficulties  Cognition Arousal/Alertness: Awake/alert Behavior During Therapy: Flat affect Overall Cognitive Status: Impaired/Different from baseline Area of Impairment: Following commands;Problem solving;Safety/judgement;Memory     Memory: Decreased short-term memory Following Commands: Follows one step commands with increased time Safety/Judgement: Decreased awareness of safety;Decreased awareness of deficits   Problem Solving: Slow processing;Decreased initiation;Difficulty sequencing;Requires verbal cues;Requires tactile cues General Comments: pt processing very slowly and demonstrates deficits with receptive and expressive speech. Was able to state name and birthday with increased time and multiple tries    General Comments General comments (skin integrity, edema, etc.): discussed the need for "brain rest" and monitoring number of visitors and amount of time that he has stimulation to help him recover. Wife voiced understanding    Exercises General Exercises - Lower Extremity Ankle Circles/Pumps: AROM;Both;20 reps;Seated      Assessment/Plan    PT Assessment Patient needs continued PT services  PT Diagnosis Difficulty walking;Abnormality of gait;Generalized weakness;Hemiplegia dominant side   PT Problem List Decreased strength;Decreased range of motion;Decreased activity tolerance;Decreased balance;Decreased mobility;Decreased coordination;Decreased cognition;Decreased knowledge of use of DME;Decreased safety awareness;Decreased knowledge of precautions  PT Treatment Interventions DME instruction;Gait training;Functional mobility training;Therapeutic activities;Therapeutic exercise;Balance training;Neuromuscular re-education;Cognitive  remediation;Patient/family education   PT Goals (Current goals can be found in the Care Plan section) Acute Rehab PT Goals Patient Stated Goal: return home PT Goal Formulation: With patient/family Time For Goal Achievement: 09/22/15 Potential to Achieve Goals: Fair    Frequency Min 4X/week   Barriers to discharge        Co-evaluation PT/OT/SLP Co-Evaluation/Treatment: Yes Reason for Co-Treatment: Complexity of the patient's impairments (multi-system involvement);Necessary to address cognition/behavior during functional activity;For patient/therapist safety PT goals addressed during session: Mobility/safety with mobility;Balance;Strengthening/ROM         End of Session Equipment Utilized During Treatment: Gait belt Activity Tolerance: Patient tolerated treatment well Patient left: in chair;with call bell/phone within reach;with chair alarm set;with family/visitor present Nurse Communication: Mobility status         Time: 3893-7342 PT Time Calculation (min) (ACUTE ONLY): 31 min   Charges:   PT Evaluation $PT Eval Moderate Complexity: 1 Procedure     PT G Codes:      Leighton Roach, PT  Acute Rehab Services  [place], [place] 09/08/2015, 2:16 PM

## 2015-09-08 NOTE — Progress Notes (Signed)
No issues overnight.  EXAM:  BP 118/41 mmHg  Pulse 58  Temp(Src) 98 F (36.7 C) (Oral)  Resp 15  Wt 72.576 kg (160 lb)  SpO2 96%  Awake, alert,  Speech broken, naming and repetition are intact Follows commands 4-/5 RLE, 3/5 RUE Good strength LUE/LLE  IMPRESSION:  81 y.o. 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 - Postop right hemiparesis likely from caudate infarct, has worsening of preop primarily expressive aphasia  PLAN: - Cont with therapy including PT/OT/SLP - Will likely need CIR

## 2015-09-08 NOTE — Care Management Note (Signed)
Case Management Note  Patient Details  Name: Jorge Maynard MRN: [id number] Date of Birth: 12-14-1934  Subjective/Objective:    Patient admitted with Lt frontal tumor and underwent a craniotomy. Patient is from home with his spouse.                 Action/Plan: Awaiting PT/OT recommendations. CM following for discharge needs.   Expected Discharge Date:                  Expected Discharge Plan:     In-House Referral:     Discharge planning Services     Post Acute Care Choice:    Choice offered to:     DME Arranged:    DME Agency:     HH Arranged:    HH Agency:     Status of Service:  In process, will continue to follow  Medicare Important Message Given:    Date Medicare IM Given:    Medicare IM give by:    Date Additional Medicare IM Given:    Additional Medicare Important Message give by:     If discussed at [place] of Stay Meetings, dates discussed:    Additional Comments:  Pollie Friar, RN 09/08/2015, 12:00 PM

## 2015-09-08 NOTE — Evaluation (Signed)
Occupational Therapy Evaluation Patient Details Name: Jorge Maynard MRN: [id number] DOB: 26-Nov-1934 Today's Date: 09/08/2015    History of Present Illness Jorge Maynard is a 81 y.o. 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 He was also found to have lung metastases which were initially treated with surgical resection. He was then discovered to have more lung metastases which are treated with stereotactic radiosurgery. PMH also includes HTN, CAD, GERD, gout, arthritis, myopia in both eyes and right shoulder surgery. Over the last 2 weeks he and his wife have noted difficulty speaking. Pt underwent left frontotemporal craniotomy for tumor resection.   Clinical Impression   Pt admitted with above. Pt independent with ADLs, PTA. Feel pt will benefit from acute OT to increase independence and strength prior to d/c. Recommending CIR for rehab and feel pt is great candidate.    Follow Up Recommendations  CIR    Equipment Recommendations    defer to next venue   Recommendations for Other Services Rehab consult     Precautions / Restrictions Precautions Precautions: Fall Restrictions Weight Bearing Restrictions: No      Mobility Bed Mobility Overal bed mobility: Needs Assistance Bed Mobility: Supine to Sit     Supine to sit: Max assist     General bed mobility comments: assist with trunk and hips  Transfers Overall transfer level: Needs assistance Equipment used: 2 person hand held assist Transfers: Sit to/from Stand;Stand Pivot Transfers Sit to Stand: +2 physical assistance;Mod assist Stand pivot transfers: Mod assist;+2 physical assistance       General transfer comment: pt unable to move right leg with first standing, needed mod A +2 for weight shift as well as facilitation of right step. Though as he progressed, was able to advance RLE short distance with weight shift assistance    Balance Overall  balance assessment: Needs assistance Sitting-balance support: Bilateral upper extremity supported;Feet supported Sitting balance-Leahy Scale: Poor Sitting balance - Comments: mod A to maintain sitting EOB due to posterior lean but pt improved. Postural control: Posterior lean  Standing balance support: Bilateral upper extremity supported Standing balance-Leahy Scale: Poor                              ADL Overall ADL's : Needs assistance/impaired                 Upper Body Dressing : Sitting;Moderate assistance   Lower Body Dressing: Sit to/from stand;Maximal assistance; +2 physical assistance   Toilet Transfer: +2 for physical assistance;Moderate assistance;Ambulation;Stand-pivot (sit to stand from bed)           Functional mobility during ADLs: Moderate assistance;+2 for physical assistance General ADL Comments: Suggested elevating Rt hand due to edema     Vision     Perception     Praxis      Pertinent Vitals/Pain Pain Assessment: Faces Faces Pain Scale: Hurts little more Pain Location: bilateral LEs Pain Descriptors / Indicators: Sore Pain Intervention(s): Monitored during session     Hand Dominance Right   Extremity/Trunk Assessment Upper Extremity Assessment Upper Extremity Assessment: RUE deficits/detail;LUE deficits/detail RUE Deficits / Details: less than 90 degrees AROM shoulder flexion when sitting in chair 2+/5 RUE Coordination: decreased fine motor;decreased gross motor LUE Deficits / Details: generalized weakness in shoulder flexors   Lower Extremity Assessment Lower Extremity Assessment: Defer to PT evaluation   Cervical / Trunk  Assessment Cervical / Trunk Assessment: Kyphotic   Communication Communication Communication: Expressive difficulties   Cognition Arousal/Alertness: Awake/alert Behavior During Therapy: Flat affect Overall Cognitive Status: Impaired/Different from baseline Area of Impairment: Following  commands;Problem solving;Safety/judgement;Memory     Memory: Decreased short-term memory Following Commands: Follows one step commands with increased time Safety/Judgement: Decreased awareness of safety;Decreased awareness of deficits   Problem Solving: Slow processing;Decreased initiation;Difficulty sequencing;Requires verbal cues General Comments: pt processing very slowly and demonstrates deficits with receptive and expressive speech. Was able to state name and birthday with increased time and multiple tries   General Comments           Shoulder Instructions      Home Living Family/patient expects to be discharged to:: Unsure Living Arrangements: Spouse/significant other Available Help at Discharge: Family;Available 24 hours/day Type of Home: House Home Access: Stairs to enter [employer] of Steps: 3 Entrance Stairs-Rails: None Home Layout: Two level;Able to live on main level with bedroom/bathroom Alternate Level Stairs-Number of Steps: flight   Bathroom Shower/Tub: Occupational psychologist: Standard     Home Equipment: Shower seat - built in   Additional Comments: wife was an Therapist, sports, pt has Engineer, production background      Prior Functioning/Environment Level of Independence: Independent             OT Diagnosis: Other (comment) (hemiparesis dominant side)   OT Problem List: Decreased strength;Decreased coordination;Impaired balance (sitting and/or standing);Decreased knowledge of use of DME or AE;Decreased knowledge of precautions;Pain;Increased edema;Impaired UE functional use;Decreased cognition;Decreased safety awareness;edema; decreased range of motion   OT Treatment/Interventions: Self-care/ADL training;DME and/or AE instruction;Therapeutic activities;Patient/family education;Balance training;Cognitive remediation/compensation;Therapeutic exercise    OT Goals(Current goals can be found in the care plan section) Acute Rehab OT Goals Patient  Stated Goal: not stated OT Goal Formulation: With patient/family Time For Goal Achievement: 09/15/15 Potential to Achieve Goals: Good ADL Goals Pt Will Perform Lower Body Bathing: sit to/from stand;with min assist (with or without AE) Pt Will Perform Lower Body Dressing: sit to/from stand;with mod assist (with or without AE) Pt Will Transfer to Toilet: with min assist;ambulating;bedside commode Pt Will Perform Toileting - Clothing Manipulation and hygiene: with min assist;sit to/from stand Additional ADL Goal #1: Pt will perform HEP for RUE to increase strength and coordination.  OT Frequency: Min 2X/week   Barriers to D/C:            Co-evaluation PT/OT/SLP Co-Evaluation/Treatment: Yes Reason for Co-Treatment: Complexity of the patient's impairments (multi-system involvement);For patient/therapist safety PT goals addressed during session: Mobility/safety with mobility;Balance;Strengthening/ROM OT goals addressed during session: Other (comment);ADL's and self-care (mobility)      End of Session Equipment Utilized During Treatment: Gait belt Nurse Communication: Mobility status  Activity Tolerance: Patient tolerated treatment well Patient left: in chair;with call bell/phone within reach;with family/visitor present   Time: 1032-1101 OT Time Calculation (min): 29 min Charges:  OT General Charges $OT Visit: 1 Procedure OT Evaluation $OT Eval Moderate Complexity: 1 Procedure G-CodesBenito Mccreedy OTR/L [phone] 09/08/2015, 2:44 PM

## 2015-09-08 NOTE — Consult Note (Signed)
Physical Medicine and Rehabilitation Consult Reason for Consult: Metastatic left frontal brain tumor Referring Physician: Dr. Kathyrn Sheriff   HPI: Jorge Maynard is a 81 y.o. 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 History taken from chart review. Was also found to have lung metastasis which initially treated with surgical resection. Patient then discovered to have more lung metastasis which was treated with stereotactic radiosurgery. Over the past 2 weeks wife noted increased difficulty in speaking. Patient lives with wife in [place]. Independent prior to admission. 2 level home with bedroom downstairs.  He presented 09/05/2015 with X-ray imaging with MRI demonstrated a left frontal peripheral enhancing tumor with surrounding edema likely metastatic in nature. Underwent stereotactic left frontotemporal craniotomy for resection of tumor 09/05/2015 per Dr. Kathyrn Sheriff. Decadron taper as directed. Keppra added for seizure prophylaxis. Tolerating a regular consistency diet. Physical occupational therapy evaluation completed 09/08/2015 with recommendations of physical medicine rehabilitation consult.   Review of Systems  Unable to perform ROS: language   Past Medical History  Diagnosis Date  . Sarcoma ([place]) 12/31/2014  . Hypertension   . Coronary artery disease   . Peripheral vascular disease ([place])   . GERD (gastroesophageal reflux disease)   . Gout   . Aortic stenosis   . Carotid stenosis   . Pathologic myopia, both eyes   . Posterior staphyloma   . 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 Malignant neoplasm of skin   . Memory changes   . Enlarged prostate   . Arthritis   . Complication of anesthesia   . PONV (postoperative nausea and vomiting)   . Aortic stenosis     moderate on 12/2013 echo   Past Surgical History  Procedure Laterality Date  . Colonoscopy N/A 01/07/2015    Procedure:  COLONOSCOPY;  Surgeon: Lollie Sails, MD;  Location: [facility]COPY;  Service: Endoscopy;  Laterality: N/A;  . Coronary angioplasty      stents - 2 different times  . Eye surgery Bilateral     cataract surgery with lens implant  . Lung cancer surgery      2015   . Rotator cuff repair Right   . Fracture surgery    . Tendon repair Left     elbow  . Vasectomy    . Craniotomy Right 09/05/2015    Procedure: Stereotactic Right Craniotomy for resection of tumor with BrainLab;  Surgeon: Consuella Lose, MD;  Location: [place] NEURO ORS;  Service: Neurosurgery;  Laterality: Right;  Stereotactic Right Craniotomy for resection of tumor with BrainLab  . Application of cranial navigation N/A 09/05/2015    Procedure: APPLICATION OF CRANIAL NAVIGATION;  Surgeon: Consuella Lose, MD;  Location: [place] NEURO ORS;  Service: Neurosurgery;  Laterality: N/A;   Family History  Problem Relation Age of Onset  . Heart disease Mother   . Heart disease Father    Social History:  reports that he quit smoking about 50 years ago. He has never used smokeless tobacco. He reports that he drinks alcohol. He reports that he does not use illicit drugs. Allergies: No Known Allergies Medications Prior to Admission  Medication Sig Dispense Refill  . allopurinol (ZYLOPRIM) 300 MG tablet Take 300 mg by mouth daily.    Marland Kitchen amLODipine (NORVASC) 10 MG tablet Take 10 mg by mouth at bedtime.     Marland Kitchen aspirin 81 MG tablet Take 81 mg by mouth daily.    Marland Kitchen  atorvastatin (LIPITOR) 80 MG tablet Take 80 mg by mouth at bedtime.     . carvedilol (COREG) 6.25 MG tablet Take 6.25 mg by mouth 2 (two) times daily with a meal.    . chlorthalidone (HYGROTON) 25 MG tablet Take 12.5 mg by mouth 2 (two) times daily.    . clopidogrel (PLAVIX) 75 MG tablet Take 75 mg by mouth every morning.    Marland Kitchen dexamethasone (DECADRON) 4 MG tablet Take 8 mg by mouth 2 (two) times daily.    . Esomeprazole Magnesium (NEXIUM 24HR PO) Take 22.3 mg by mouth daily.    Marland Kitchen  ibuprofen (ADVIL,MOTRIN) 200 MG tablet Take 400 mg by mouth every 6 (six) hours as needed for moderate pain.    . pseudoephedrine-guaifenesin (MUCINEX D) 60-600 MG 12 hr tablet Take 1 tablet by mouth every 12 (twelve) hours.    . ramipril (ALTACE) 10 MG capsule Take 10 mg by mouth 2 (two) times daily.    Marland Kitchen dexamethasone (DECADRON) 4 MG tablet 2 tablets twice a day  With food  and further instructions to follow ( pt will be seen 08/28/15 in office) (Patient not taking: Reported on 09/01/2015) 60 tablet 0    Home: Home Living Family/patient expects to be discharged to:: Unsure Living Arrangements: Spouse/significant other Available Help at Discharge: Family, Available 24 hours/day Type of Home: House Home Access: Stairs to enter [employer] of Steps: 3 Entrance Stairs-Rails: None Home Layout: Two level, Able to live on main level with bedroom/bathroom Alternate Level Stairs-Number of Steps: flight Bathroom Shower/Tub: Multimedia programmer: Standard Home Equipment: Civil engineer, contracting - built in Additional Comments: wife was an Therapist, sports, pt has Engineer, production background  Functional History: Prior Function Level of Independence: Independent Functional Status:  Mobility: Bed Mobility Overal bed mobility: Needs Assistance, +2 for physical assistance Bed Mobility: Supine to Sit Supine to sit: Max assist General bed mobility comments: +2 mod A for problem solving through motion as well as for stabilizing throughout Transfers Overall transfer level: Needs assistance Equipment used: 2 person hand held assist Transfers: Sit to/from Stand, Stand Pivot Transfers Sit to Stand: +2 physical assistance, Mod assist Stand pivot transfers: Mod assist, +2 physical assistance General transfer comment: pt unable to move right leg with first standing, needed mod A +2 for weight shift as well as facilitation of right step. Though as he progressed, was able to advance RLE short distance with weight  shift assistance Ambulation/Gait Ambulation/Gait assistance: Mod assist, +2 physical assistance Ambulation Distance (Feet): 4 Feet (2' fwd, 2' bkwd) Assistive device: 2 person hand held assist Gait Pattern/deviations: Step-to pattern, Decreased weight shift to right, Decreased step length - right, Decreased stance time - right General Gait Details: manual facilitation at right hip and foot for RLE stepping fwd and bkwd. Mod A +2 for support in standing Gait velocity: decreased Gait velocity interpretation: <1.8 ft/sec, indicative of risk for recurrent falls    ADL: ADL Overall ADL's : Needs assistance/impaired Upper Body Dressing : Sitting, Moderate assistance Lower Body Dressing: Sit to/from stand, Maximal assistance, +2 for physical assistance Toilet Transfer: +2 for physical assistance, Moderate assistance, Ambulation, Stand-pivot (sit to stand from bed) Functional mobility during ADLs: Moderate assistance, +2 for physical assistance General ADL Comments: Suggested elevating Rt hand due to edema  Cognition: Cognition Overall Cognitive Status: Impaired/Different from baseline Orientation Level: Oriented to person, Oriented to place, Oriented to situation Cognition Arousal/Alertness: Awake/alert Behavior During Therapy: Flat affect Overall Cognitive Status: Impaired/Different from baseline Area of Impairment: Following  commands, Problem solving, Safety/judgement, Memory Memory: Decreased short-term memory Following Commands: Follows one step commands with increased time Safety/Judgement: Decreased awareness of safety, Decreased awareness of deficits Problem Solving: Slow processing, Decreased initiation, Difficulty sequencing, Requires verbal cues General Comments: pt processing very slowly and demonstrates deficits with receptive and expressive speech. Was able to state name and birthday with increased time and multiple tries  Blood pressure 118/41, pulse 58, temperature 98 F  (36.7 C), temperature source Oral, resp. rate 15, weight 72.576 kg (160 lb), SpO2 96 %. Physical Exam  Vitals reviewed. Constitutional: He appears well-developed and well-nourished.  HENT:  Mouth/Throat: Oropharynx is clear and moist.  Craniotomy site is dressed with heavy bulky dressing  Eyes: Conjunctivae and EOM are normal.  Pupils reactive to light  Neck: Normal range of motion. Neck supple. No thyromegaly present.  Cardiovascular: Normal rate and regular rhythm.   Murmur heard. Respiratory: Effort normal and breath sounds normal. No respiratory distress.  GI: Soft. Bowel sounds are normal. He exhibits no distension.  Musculoskeletal:  No tenderness or edema in extremities  PROM WNL  Neurological: He is alert.  He makes good eye contact with examiner.  Patient follows simple commands.  A&Ox0 Confabulation Sensation ?intact to light touch Motor: LUE/LLE: 4+/5 (?initiation) RUE/RLE: 4/5 (?inititation) Right DTRs decreased vs. Left  Skin: Skin is warm and dry.  Psychiatric: His behavior is normal. His speech is tangential. Cognition and memory are impaired.    Results for orders placed or performed during the hospital encounter of 09/05/15 (from the past 24 hour(s))  Glucose, capillary     Status: Abnormal   Collection Time: 09/07/15  3:20 PM  Result Value Ref Range   Glucose-Capillary 150 (H) 65 - 99 mg/dL  Glucose, capillary     Status: Abnormal   Collection Time: 09/07/15  9:51 PM  Result Value Ref Range   Glucose-Capillary 126 (H) 65 - 99 mg/dL  Glucose, capillary     Status: Abnormal   Collection Time: 09/07/15 11:44 PM  Result Value Ref Range   Glucose-Capillary 131 (H) 65 - 99 mg/dL  Glucose, capillary     Status: Abnormal   Collection Time: 09/08/15  3:20 AM  Result Value Ref Range   Glucose-Capillary 136 (H) 65 - 99 mg/dL  Renal function panel     Status: Abnormal   Collection Time: 09/08/15  4:50 AM  Result Value Ref Range   Sodium 141 135 - 145 mmol/L    Potassium 4.0 3.5 - 5.1 mmol/L   Chloride 106 101 - 111 mmol/L   CO2 28 22 - 32 mmol/L   Glucose, Bld 144 (H) 65 - 99 mg/dL   BUN 17 6 - 20 mg/dL   Creatinine, Ser 0.70 0.61 - 1.24 mg/dL   Calcium 8.2 (L) 8.9 - 10.3 mg/dL   Phosphorus 2.4 (L) 2.5 - 4.6 mg/dL   Albumin 1.8 (L) 3.5 - 5.0 g/dL   GFR calc non Af Amer >60 >60 mL/min   GFR calc Af Amer >60 >60 mL/min   Anion gap 7 5 - 15  CBC with Differential/Platelet     Status: Abnormal   Collection Time: 09/08/15  4:50 AM  Result Value Ref Range   WBC 20.0 (H) 4.0 - 10.5 K/uL   RBC 3.62 (L) 4.22 - 5.81 MIL/uL   Hemoglobin 10.9 (L) 13.0 - 17.0 g/dL   HCT 33.0 (L) 39.0 - 52.0 %   MCV 91.2 78.0 - 100.0 fL   MCH 30.1 26.0 -  34.0 pg   MCHC 33.0 30.0 - 36.0 g/dL   RDW 14.5 11.5 - 15.5 %   Platelets 174 150 - 400 K/uL   Neutrophils Relative % 90 %   Neutro Abs 18.2 (H) 1.7 - 7.7 K/uL   Lymphocytes Relative 4 %   Lymphs Abs 0.7 0.7 - 4.0 K/uL   Monocytes Relative 6 %   Monocytes Absolute 1.2 (H) 0.1 - 1.0 K/uL   Eosinophils Relative 0 %   Eosinophils Absolute 0.0 0.0 - 0.7 K/uL   Basophils Relative 0 %   Basophils Absolute 0.0 0.0 - 0.1 K/uL  Magnesium     Status: None   Collection Time: 09/08/15  4:50 AM  Result Value Ref Range   Magnesium 2.2 1.7 - 2.4 mg/dL  Triglycerides     Status: None   Collection Time: 09/08/15  4:50 AM  Result Value Ref Range   Triglycerides 79 <150 mg/dL  Glucose, capillary     Status: Abnormal   Collection Time: 09/08/15  8:53 AM  Result Value Ref Range   Glucose-Capillary 193 (H) 65 - 99 mg/dL   Comment 1 Notify RN    Comment 2 Document in Chart   Glucose, capillary     Status: Abnormal   Collection Time: 09/08/15 12:19 PM  Result Value Ref Range   Glucose-Capillary 120 (H) 65 - 99 mg/dL   Comment 1 Notify RN    Comment 2 Document in Chart    Mr Jeri Cos Wo Contrast  09/06/2015  CLINICAL DATA:  Status post resection of large left frontal metastasis. Follow-up. EXAM: MRI HEAD WITHOUT AND WITH  CONTRAST TECHNIQUE: Multiplanar, multiecho pulse sequences of the brain and surrounding structures were obtained without and with intravenous contrast. CONTRAST:  27m MULTIHANCE GADOBENATE DIMEGLUMINE 529 MG/ML IV SOLN COMPARISON:  09/04/2015 FINDINGS: There is been left frontal craniotomy for resection of a a nearly 7 cm left frontal lobe metastasis. There is peri-resection infarction along the posterior and superior margin of the resection, with involvement of the left caudate. The post resection cavity is filled with fluid, some blood products and air. Left frontal horn may communicate with the resection cavity. There is extradural fluid and air measuring 2 cm in thickness. There is 2 mm of left-to-right midline shift. I do not see any evidence of residual hyper enhancing tumor. This would appear to represent a complete resection. Small amount of blood dependent within the lateral ventricles. Chronic small-vessel ischemic changes are present within the cerebral hemispheric white matter bilaterally. No hydrocephalus. IMPRESSION: Apparent total resection of a large left frontal metastatic lesion. Post resective space is filled with fluid, blood and air. Extradural fluid and air measuring 2.1 cm in thickness. Mild mass effect with left-to-right shift of 2 mm. Small amount of peri-resection infarction along the posterior and superior margin including involvement of the caudate head. Frontal horn of the left lateral ventricle may communicate with the resection cavity. Electronically Signed   By: MNelson ChimesM.D.   On: 09/06/2015 17:31    Assessment/Plan: Diagnosis: Poorly differentiated sarcoma with mets (including brain) Labs and images independently reviewed.  Records reviewed and summated above.  1. Does the need for close, 24 hr/day medical supervision in concert with the patient's rehab needs make it unreasonable for this patient to be served in a less intensive setting? Yes  2. Co-Morbidities requiring  supervision/potential complications: leukocytosis (likely secondary to steroids, continue to periodically monitor - consider further workup if necessary), ABLA (transfuse if necessary to  ensure appropriate perfusion for increased activity tolerance), HTN (monitor and provide prns in accordance with increased physical exertion and pain), CAD (cont meds), aortic stenosis (Monitor in accordance with increased physical activity and avoid UE resistance excercises) 3. Due to safety, skin/wound care, disease management, pain management and patient education, does the patient require 24 hr/day rehab nursing? Yes 4. Does the patient require coordinated care of a physician, rehab nurse, PT (1-2 hrs/day, 5 days/week), OT (1-2 hrs/day, 5 days/week) and SLP (1-2 hrs/day, 5 days/week) to address physical and functional deficits in the context of the above medical diagnosis(es)? Yes Addressing deficits in the following areas: balance, endurance, locomotion, strength, transferring, dressing, toileting, cognition, speech, language and psychosocial support 5. Can the patient actively participate in an intensive therapy program of at least 3 hrs of therapy per day at least 5 days per week? Yes 6. The potential for patient to make measurable gains while on inpatient rehab is excellent 7. Anticipated functional outcomes upon discharge from inpatient rehab are modified independent and supervision  with PT, modified independent and supervision with OT, modified independent and supervision with SLP. 8. Estimated rehab length of stay to reach the above functional goals is: 13-16 days. 9. Does the patient have adequate social supports and living environment to accommodate these discharge functional goals? Potentially 10. Anticipated D/C setting: Home 11. Anticipated post D/C treatments: HH therapy and Home excercise program 12. Overall Rehab/Functional Prognosis: good and fair  RECOMMENDATIONS: This patient's condition is  appropriate for continued rehabilitative care in the following setting: CIR once medically appropriate. Patient has agreed to participate in recommended program. Potentially Note that insurance prior authorization may be required for reimbursement for recommended care.  Comment: Rehab Admissions Coordinator to follow up.  Delice Lesch, MD 09/08/2015

## 2015-09-08 NOTE — Progress Notes (Signed)
Inpatient Rehabilitation  Patient was screened by Gunnar Fusi for appropriateness for an Inpatient Acute Rehab consult.  At this time we are recommending an Inpatient Rehab consult.  Please order if you are agreeable.    Carmelia Roller., CCC/SLP Admission Coordinator  [place]  Cell [phone]

## 2015-09-09 LAB — CBC WITH DIFFERENTIAL/PLATELET
BASOS ABS: 0 10*3/uL (ref 0.0–0.1)
Basophils Relative: 0 %
EOS ABS: 0 10*3/uL (ref 0.0–0.7)
Eosinophils Relative: 0 %
HCT: 31.9 % — ABNORMAL LOW (ref 39.0–52.0)
HEMOGLOBIN: 10.8 g/dL — AB (ref 13.0–17.0)
LYMPHS ABS: 0.6 10*3/uL — AB (ref 0.7–4.0)
Lymphocytes Relative: 3 %
MCH: 30.7 pg (ref 26.0–34.0)
MCHC: 33.9 g/dL (ref 30.0–36.0)
MCV: 90.6 fL (ref 78.0–100.0)
Monocytes Absolute: 0.9 10*3/uL (ref 0.1–1.0)
Monocytes Relative: 5 %
NEUTROS PCT: 92 %
Neutro Abs: 15.7 10*3/uL — ABNORMAL HIGH (ref 1.7–7.7)
PLATELETS: 177 10*3/uL (ref 150–400)
RBC: 3.52 MIL/uL — AB (ref 4.22–5.81)
RDW: 14.6 % (ref 11.5–15.5)
WBC: 17.2 10*3/uL — AB (ref 4.0–10.5)

## 2015-09-09 LAB — GLUCOSE, CAPILLARY
GLUCOSE-CAPILLARY: 118 mg/dL — AB (ref 65–99)
GLUCOSE-CAPILLARY: 114 mg/dL — AB (ref 65–99)
GLUCOSE-CAPILLARY: 114 mg/dL — AB (ref 65–99)
Glucose-Capillary: 148 mg/dL — ABNORMAL HIGH (ref 65–99)

## 2015-09-09 LAB — RENAL FUNCTION PANEL
Albumin: 1.8 g/dL — ABNORMAL LOW (ref 3.5–5.0)
Anion gap: 7 (ref 5–15)
BUN: 24 mg/dL — ABNORMAL HIGH (ref 6–20)
CHLORIDE: 107 mmol/L (ref 101–111)
CO2: 27 mmol/L (ref 22–32)
Calcium: 8 mg/dL — ABNORMAL LOW (ref 8.9–10.3)
Creatinine, Ser: 0.79 mg/dL (ref 0.61–1.24)
Glucose, Bld: 140 mg/dL — ABNORMAL HIGH (ref 65–99)
POTASSIUM: 4.1 mmol/L (ref 3.5–5.1)
Phosphorus: 2.7 mg/dL (ref 2.5–4.6)
Sodium: 141 mmol/L (ref 135–145)

## 2015-09-09 LAB — MAGNESIUM: MAGNESIUM: 2.1 mg/dL (ref 1.7–2.4)

## 2015-09-09 LAB — TRIGLYCERIDES: TRIGLYCERIDES: 108 mg/dL (ref <150)

## 2015-09-09 MED ORDER — SODIUM CHLORIDE 0.9% FLUSH: 10.0000 mL | INTRAVENOUS | Status: DC | PRN

## 2015-09-09 MED ORDER — BISACODYL 10 MG RE SUPP
10.0000 mg | Freq: Once | RECTAL | Status: AC
  Administered 2015-09-09: 10 mg via RECTAL
  Filled 2015-09-09: qty 1

## 2015-09-09 MED ORDER — PANTOPRAZOLE SODIUM 40 MG PO TBEC
40.0000 mg | DELAYED_RELEASE_TABLET | Freq: Every day | ORAL | Status: DC
  Administered 2015-09-11 – 2015-09-12 (×2): 40 mg via ORAL
  Filled 2015-09-09 (×2): qty 1

## 2015-09-09 MED ORDER — DEXAMETHASONE 4 MG PO TABS
4.0000 mg | ORAL_TABLET | Freq: Three times a day (TID) | ORAL | Status: DC
  Administered 2015-09-09 – 2015-09-12 (×10): 4 mg via ORAL
  Filled 2015-09-09 (×10): qty 1

## 2015-09-09 NOTE — Progress Notes (Signed)
Physical Therapy Treatment Patient Details Name: Jorge Maynard MRN: [id number] DOB: [birth year] Today's Date: 09/09/2015    History of Present Illness Jorge Maynard is a 80 y.o. 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 He was also found to have lung metastases which were initially treated with surgical resection. He was then discovered to have more lung metastases which are treated with stereotactic radiosurgery. Over the last 2 weeks he and his wife have noted difficulty speaking. Pt underwent left frontotemporal craniotomy for tumor resection.    PT Comments    Patient able to follow visual gestures with tactile cues/facilitation with incr time. Improved movement of RLE, however continues to need assist to advance leg during gait.   Follow Up Recommendations  CIR     Equipment Recommendations  Other (comment) (TBD)    Recommendations for Other Services       Precautions / Restrictions Precautions Precautions: Fall Restrictions Weight Bearing Restrictions: No    Mobility  Bed Mobility                  Transfers Overall transfer level: Needs assistance Equipment used: Rolling walker (2 wheeled) Transfers: Sit to/from Stand Sit to Stand: +2 physical assistance;Mod assist;Min assist         General transfer comment: Initial transfer with posterior lean and required 2 person assist; after ambulation, stood with min assist  Ambulation/Gait Ambulation/Gait assistance: Mod assist;+2 physical assistance;+2 safety/equipment Ambulation Distance (Feet): 6 Feet Assistive device: Rolling walker (2 wheeled) Gait Pattern/deviations: Step-through pattern;Decreased step length - right;Decreased weight shift to right (able to advance RLE without assist ~3 inches; assist to incr) Gait velocity: decreased   General Gait Details: assist to steady RW; assist to advance RLE and maintain balance; distance limited  by episode of incontinence (was able to use urinal with Lt hand when given to him and stood without UE support and min assist x 2 minutes   Stairs            Wheelchair Mobility    Modified Rankin (Stroke Patients Only)       Balance   Sitting-balance support: No upper extremity supported;Feet supported Sitting balance-Leahy Scale: Fair Sitting balance - Comments: edge of chair   Standing balance support: Bilateral upper extremity supported Standing balance-Leahy Scale: Poor                      Cognition Arousal/Alertness: Awake/alert Behavior During Therapy: WFL for tasks assessed/performed Overall Cognitive Status: Difficult to assess                 General Comments: pure verbal cues pt does not follow; with gestures/visual cues he needs incr time    Exercises General Exercises - Lower Extremity Ankle Circles/Pumps: AROM;Both;20 reps;Seated    General Comments General comments (skin integrity, edema, etc.): Wife arrived at end of session and updated      Pertinent Vitals/Pain Pain Assessment: Faces Faces Pain Scale: Hurts a little bit Pain Location: states yes having pain (asked multiple ways), yet unable to indicate where Pain Intervention(s): Limited activity within patient's tolerance;Monitored during session;Repositioned    Home Living                      Prior Function            PT Goals (current goals can now be found in the care plan section) Acute  Rehab PT Goals Patient Stated Goal: return home Time For Goal Achievement: 09/22/15 Progress towards PT goals: Progressing toward goals    Frequency  Min 4X/week    PT Plan Current plan remains appropriate    Co-evaluation             End of Session Equipment Utilized During Treatment: Gait belt Activity Tolerance: Patient tolerated treatment well Patient left: in chair;with call bell/phone within reach;with chair alarm set;with family/visitor present      Time: 4709-6283 PT Time Calculation (min) (ACUTE ONLY): 26 min  Charges:  $Gait Training: 8-22 mins $Therapeutic Activity: 8-22 mins                    G Codes:      Jorge Maynard 10/09/15, 10:17 AM Pager [phone]

## 2015-09-09 NOTE — Evaluation (Signed)
Speech Language Pathology Evaluation Patient Details Name: Jorge Maynard MRN: [id number] DOB: Jun 18, 1935 Today's Date: 09/09/2015 Time: 2956-2130 SLP Time Calculation (min) (ACUTE ONLY): 40 min  Problem List:  Patient Active Problem List   Diagnosis Date Noted  . Debility   . Leukocytosis   . Acute blood loss anemia   . Benign essential HTN   . Coronary artery disease involving native coronary artery of native heart without angina pectoris   . Aortic stenosis   . S/P craniotomy 09/05/2015  . Brain metastasis ([place]) 09/05/2015  . Sarcoma ([place]) 12/31/2014  . Malignant neoplasm metastatic to lung ([place]) 09/10/2014  . Carotid artery narrowing 03/26/2014  . Degenerative myopia 03/25/2014  . Aortic heart valve narrowing 10/13/2011  . CAD in native artery 10/13/2011  . Essential (primary) hypertension 10/13/2011  . HLD (hyperlipidemia) 10/13/2011  . Peripheral vascular disease ([place]) 10/13/2011   Past Medical History:  Past Medical History  Diagnosis Date  . 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 Hypertension   . Coronary artery disease   . Peripheral vascular disease ([place])   . GERD (gastroesophageal reflux disease)   . Gout   . Aortic stenosis   . Carotid stenosis   . Pathologic myopia, both eyes   . Posterior staphyloma   . 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 Malignant neoplasm of skin   . Memory changes   . Enlarged prostate   . Arthritis   . Complication of anesthesia   . PONV (postoperative nausea and vomiting)   . Aortic stenosis     moderate on 12/2013 echo   Past Surgical History:  Past Surgical History  Procedure Laterality Date  . Colonoscopy N/A 01/07/2015    Procedure: COLONOSCOPY;  Surgeon: Lollie Sails, MD;  Location: [facility] ENDOSCOPY;  Service: Endoscopy;  Laterality: N/A;  . Coronary angioplasty      stents - 2 different times  . Eye surgery Bilateral     cataract surgery with lens implant  . Lung cancer surgery      2015   . Rotator cuff repair Right    . Fracture surgery    . Tendon repair Left     elbow  . Vasectomy    . Craniotomy Right 09/05/2015    Procedure: Stereotactic Right Craniotomy for resection of tumor with BrainLab;  Surgeon: Consuella Lose, MD;  Location: [place] NEURO ORS;  Service: Neurosurgery;  Laterality: Right;  Stereotactic Right Craniotomy for resection of tumor with BrainLab  . Application of cranial navigation N/A 09/05/2015    Procedure: APPLICATION OF CRANIAL NAVIGATION;  Surgeon: Consuella Lose, MD;  Location: [place] NEURO ORS;  Service: Neurosurgery;  Laterality: N/A;   HPI:  Jorge Maynard is a 81 y.o. 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 He was also found to have lung metastases which were initially treated with surgical resection. He was then discovered to have more lung metastases which are treated with stereotactic radiosurgery. Over the last 2 weeks he and his wife have noted difficulty speaking. Pt underwent left frontotemporal craniotomy for tumor resection.   Assessment / Plan / Recommendation Clinical Impression  Cognitive-linguistic evaluation initiated.  Patient presents with moderate cognitive deficits in the areas of sustained attention and awareness of errors.  Additionally, patient presents with a moderately-severe aphasia with fluent 1-2 word utterances that are marked by paraphasic and perseverative errors.  Wife present for second part of session and requested that  SLP demonstrate how she can communicate and help spouse.  Some evaluation portions limited and SLP shifted focus to education with multimodal means to supplement and facilitate basic verbal expression.  Wife able to return demonstrate giving patient choices, semantic and phonemic cueing.  Plan to continue receptive and reading tasks in diagnostic treatment sessions.  Given deficits patient will require intensive skilled SLP services to maximize return of functional  communication.        SLP Assessment  Patient needs continued Speech Lanaguage Pathology Services    Follow Up Recommendations  Inpatient Rehab;24 hour supervision/assistance    Frequency and Duration min 3x week  2 weeks      SLP Evaluation Prior Functioning  Cognitive/Linguistic Baseline: Within functional limits Available Help at Discharge: Family;Available 24 hours/day Vocation: Retired   Associate Professor  Overall Cognitive Status: Impaired/Different from baseline Arousal/Alertness: Awake/alert Orientation Level: Oriented to person;Oriented to place;Oriented to situation Attention: Sustained Sustained Attention: Impaired Sustained Attention Impairment: Verbal basic;Functional basic Awareness: Impaired Awareness Impairment: Emergent impairment Problem Solving:  (TBA) Executive Function:  (TBA) Safety/Judgment: Impaired    Comprehension  Auditory Comprehension Yes/No Questions: Impaired Basic Biographical Questions: 51-75% accurate Basic Immediate Environment Questions: 50-74% accurate Commands: Impaired One Step Basic Commands: 50-74% accurate Conversation: Simple Interfering Components: Attention;Processing speed EffectiveTechniques: Extra processing time;Visual/Gestural cues Visual Recognition/Discrimination Discrimination: Not tested Reading Comprehension Reading Status: Not tested    Expression Expression Primary Mode of Expression: Verbal Verbal Expression Overall Verbal Expression: Impaired Initiation: No impairment Automatic Speech: Counting;Day of week;Month of year;Singing Level of Generative/Spontaneous Verbalization: Word Repetition: No impairment Naming: Impairment Responsive: Not tested Confrontation: Impaired Convergent: Not tested Divergent: Not tested Other Naming Comments: wife requested to be educated regarding effective techniques; llots of time spent on multimodal communication  Verbal Errors: Semantic paraphasias;Phonemic  paraphasias;Perseveration Pragmatics: No impairment Effective Techniques: Open ended questions;Semantic cues;Sentence completion;Phonemic cues;Articulatory cues Non-Verbal Means of Communication: Not applicable Written Expression Dominant Hand: Right Written Expression: Exceptions to [facility] Self Formulation Ability: Word   Oral / Motor  Motor Speech Overall Motor Speech: Appears within functional limits for tasks assessed Motor Speech Errors: Not applicable   GO                   Carmelia Roller., CCC-SLP [phone]  [place] Toth 09/09/2015, 4:13 PM

## 2015-09-09 NOTE — Progress Notes (Signed)
No issues overnight.   EXAM:  BP 131/47 mmHg  Pulse 59  Temp(Src) 98 F (36.7 C) (Oral)  Resp 16  Wt 72.576 kg (160 lb)  SpO2 98%  Awake, alert, oriented  Speech broken, naming intact, repetition intact CN grossly intact  5/5 Left 4/5 Right UE/LE  IMPRESSION:  81 y.o. male s/p resection of large dominant likely sarcoma met, postop right hemparesis and worsening primarily expressive aphasia  PLAN: - Cont mobilizing with PT/OT - SLP eval/treatment - Plan on CIR transfer when bed available.

## 2015-09-09 NOTE — Progress Notes (Signed)
Rehab admissions - I met with patient and his wife at the bedside.  I gave wife rehab booklets and explained inpatient rehab versus SNF.  Wife is aware of copays for hospital stay.  I have opened the case with [facility] and have sent information requesting inpatient rehab admission.  I will follow up once I hear back from insurance carrier.  Call me for questions.  #[phone]

## 2015-09-09 NOTE — Care Management Important Message (Signed)
Important Message  Patient Details  Name: Jorge Maynard MRN: [id number] Date of Birth: 02/17/1935   Medicare Important Message Given:  Yes    [name] [name] 09/09/2015, 3:25 PM

## 2015-09-10 LAB — GLUCOSE, CAPILLARY
GLUCOSE-CAPILLARY: 122 mg/dL — AB (ref 65–99)
GLUCOSE-CAPILLARY: 126 mg/dL — AB (ref 65–99)
Glucose-Capillary: 191 mg/dL — ABNORMAL HIGH (ref 65–99)
Glucose-Capillary: 140 mg/dL — ABNORMAL HIGH (ref 65–99)

## 2015-09-10 LAB — CBC WITH DIFFERENTIAL/PLATELET
BASOS PCT: 0 %
Basophils Absolute: 0 10*3/uL (ref 0.0–0.1)
Eosinophils Absolute: 0 10*3/uL (ref 0.0–0.7)
Eosinophils Relative: 0 %
HEMATOCRIT: 30.3 % — AB (ref 39.0–52.0)
HEMOGLOBIN: 10.5 g/dL — AB (ref 13.0–17.0)
LYMPHS ABS: 0.7 10*3/uL (ref 0.7–4.0)
LYMPHS PCT: 5 %
MCH: 31.4 pg (ref 26.0–34.0)
MCHC: 34.7 g/dL (ref 30.0–36.0)
MCV: 90.7 fL (ref 78.0–100.0)
MONO ABS: 0.7 10*3/uL (ref 0.1–1.0)
MONOS PCT: 5 %
NEUTROS ABS: 11.7 10*3/uL — AB (ref 1.7–7.7)
NEUTROS PCT: 90 %
Platelets: 141 10*3/uL — ABNORMAL LOW (ref 150–400)
RBC: 3.34 MIL/uL — ABNORMAL LOW (ref 4.22–5.81)
RDW: 14.4 % (ref 11.5–15.5)
WBC: 13.1 10*3/uL — ABNORMAL HIGH (ref 4.0–10.5)

## 2015-09-10 LAB — RENAL FUNCTION PANEL
ALBUMIN: 1.9 g/dL — AB (ref 3.5–5.0)
ANION GAP: 9 (ref 5–15)
BUN: 21 mg/dL — ABNORMAL HIGH (ref 6–20)
CALCIUM: 8 mg/dL — AB (ref 8.9–10.3)
CHLORIDE: 103 mmol/L (ref 101–111)
CO2: 26 mmol/L (ref 22–32)
Creatinine, Ser: 0.75 mg/dL (ref 0.61–1.24)
GFR calc Af Amer: >60 mL/min (ref >60)
Glucose, Bld: 136 mg/dL — ABNORMAL HIGH (ref 65–99)
PHOSPHORUS: 2.9 mg/dL (ref 2.5–4.6)
POTASSIUM: 4 mmol/L (ref 3.5–5.1)
Sodium: 138 mmol/L (ref 135–145)

## 2015-09-10 LAB — TRIGLYCERIDES: Triglycerides: 83 mg/dL (ref <150)

## 2015-09-10 LAB — MAGNESIUM: MAGNESIUM: 2.1 mg/dL (ref 1.7–2.4)

## 2015-09-10 NOTE — Progress Notes (Signed)
Rehab admissions - Rehab MD called Psychologist, occupational and discussed the case with him.  I have sent updated notes to the insurance case manager.  The peer to peer appeal will not give [place] an answer until tomorrow about inpatient rehab admission.  Call me for questions.  #[phone]

## 2015-09-10 NOTE — Progress Notes (Signed)
Speech Language Pathology Treatment: Cognitive-Linquistic  Patient Details Name: RYZEN DEADY MRN: [id number] DOB: [birth year] Today's Date: 09/10/2015 Time: 6681-5947 SLP Time Calculation (min) (ACUTE ONLY): 35 min  Assessment / Plan / Recommendation Clinical Impression  Skilled treatment session focused on continuation of diagnostic treatment of language.  Patient presents with a transcortical sensory aphasia with poor receptive abilities, perseveration and intact repetition and oral reading abilities.  Sentence completion and phonemic cues were most effective at eliciting accuracy with naming tasks.  Patient also requires Max assist demonstrative cues to follow basic commands in the context of activities.  Writing more intact today with patient able to write first name.  Patient continues to require and would benefit from intensive SLP therapy.     HPI HPI: JUANMIGUEL DEFELICE is a 81 y.o. 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 He was also found to have lung metastases which were initially treated with surgical resection. He was then discovered to have more lung metastases which are treated with stereotactic radiosurgery. Over the last 2 weeks he and his wife have noted difficulty speaking. Pt underwent left frontotemporal craniotomy for tumor resection.      SLP Plan  Continue with current plan of care     Recommendations                Oral Care Recommendations: Oral care BID Follow up Recommendations: Inpatient Rehab;24 hour supervision/assistance Plan: Continue with current plan of care     GO              Carmelia Roller., CCC-SLP [phone]   [place] 09/10/2015, 2:07 PM

## 2015-09-10 NOTE — Progress Notes (Signed)
Rehab admissions - I have received a denial from [facility] for acute inpatient rehab admission.  Insurance carrier says his needs can be met at a lower level of care.  I will inform wife and case manager/social worker of denial.  Call me for questions.  #[phone]

## 2015-09-10 NOTE — Progress Notes (Signed)
Rehab admissions - I am waiting to hear back from insurance carrier.  I will update all once I have decision regarding potential inpatient rehab admission.  Call me for questions.  #[phone]

## 2015-09-10 NOTE — Clinical Social Work Placement (Signed)
   CLINICAL SOCIAL WORK PLACEMENT  NOTE  Date:  09/10/2015  Patient Details  Name: Jorge Maynard MRN: [id number] Date of Birth: 10/18/1934  Clinical Social Work is seeking post-discharge placement for this patient at the [place] level of care (*CSW will initial, date and re-position this form in  chart as items are completed):  Yes   Patient/family provided with [facility] Work Department's list of facilities offering this level of care within the geographic area requested by the patient (or if unable, by the patient's family).  Yes   Patient/family informed of their freedom to choose among providers that offer the needed level of care, that participate in Medicare, Medicaid or managed care program needed by the patient, have an available bed and are willing to accept the patient.  Yes   Patient/family informed of [facility]'s ownership interest in [facility] and [facility], as well as of the fact that they are under no obligation to receive care at these facilities.  PASRR submitted to EDS on 09/10/15     PASRR number received on 09/10/15     Existing PASRR number confirmed on       FL2 transmitted to all facilities in geographic area requested by pt/family on 09/10/15     FL2 transmitted to all facilities within larger geographic area on 09/10/15     Patient informed that his/her managed care company has contracts with or will negotiate with certain facilities, including the following:        Yes   Patient/family informed of bed offers received.  Patient chooses bed at       Physician recommends and patient chooses bed at      Patient to be transferred to   on  .  Patient to be transferred to facility by       Patient family notified on   of transfer.  Name of family member notified:        PHYSICIAN Please sign FL2     Additional Comment:    _______________________________________________ Rozell Searing,  LCSW 09/10/2015, 3:18 PM

## 2015-09-10 NOTE — NC FL2 (Signed)
[place] LEVEL OF CARE SCREENING TOOL     IDENTIFICATION  Patient Name: Jorge Maynard Birthdate: June 21, 1935 Sex: male Admission Date (Current Location): 09/05/2015  [facility] and [place] Number:  Herbalist and Address:  [facility]. [facility], [place] [address] [place], [place] [address]      Provider Number: [id number]  Attending Physician Name and Address:  Consuella Lose, MD  Relative Name and Phone Number:       Current Level of Care: Hospital Recommended Level of Care: [place] Prior Approval Number:    Date Approved/Denied:   PASRR Number: [id number]  Discharge Plan: SNF    Current Diagnoses: Patient Active Problem List   Diagnosis Date Noted  . Debility   . Leukocytosis   . Acute blood loss anemia   . Benign essential HTN   . Coronary artery disease involving native coronary artery of native heart without angina pectoris   . Aortic stenosis   . S/P craniotomy 09/05/2015  . Brain metastasis ([place]) 09/05/2015  . Sarcoma ([place]) 12/31/2014  . Malignant neoplasm metastatic to lung ([place]) 09/10/2014  . Carotid artery narrowing 03/26/2014  . Degenerative myopia 03/25/2014  . Aortic heart valve narrowing 10/13/2011  . CAD in native artery 10/13/2011  . Essential (primary) hypertension 10/13/2011  . HLD (hyperlipidemia) 10/13/2011  . Peripheral vascular disease ([place]) 10/13/2011    Orientation RESPIRATION BLADDER Height & Weight     Self, Time, Situation, Place  Normal Incontinent Weight: 160 lb (72.576 kg) Height:     BEHAVIORAL SYMPTOMS/MOOD NEUROLOGICAL BOWEL NUTRITION STATUS   (NONE )  (NONE) Incontinent Diet (REGULAR )  AMBULATORY STATUS COMMUNICATION OF NEEDS Skin   Extensive Assist Verbally Surgical wounds                       Personal Care Assistance Level of Assistance  Dressing, Bathing Bathing Assistance: Maximum assistance   Dressing Assistance: Maximum assistance     Functional  Limitations Info  Speech     Speech Info: Impaired    SPECIAL CARE FACTORS FREQUENCY  PT (By licensed PT), OT (By licensed OT), Speech therapy     PT Frequency: 4 OT Frequency: 2     Speech Therapy Frequency: 3      Contractures      Additional Factors Info  Code Status, Allergies Code Status Info: FULL CODE  Allergies Info: N/A           Current Medications (09/10/2015):  This is the current hospital active medication list Current Facility-Administered Medications  Medication Dose Route Frequency Provider Last Rate Last Dose  . 0.9 %  sodium chloride infusion   Intravenous Continuous Consuella Lose, MD 75 mL/hr at 09/08/15 0118    . antiseptic oral rinse solution (CORINZ)  7 mL Mouth Rinse 10 times per day Consuella Lose, MD   7 mL at 09/09/15 0956  . chlorhexidine gluconate (PERIDEX) 0.12 % solution 15 mL  15 mL Mouth Rinse BID Consuella Lose, MD   15 mL at 09/08/15 2122  . dexamethasone (DECADRON) tablet 4 mg  4 mg Oral Q8H Consuella Lose, MD   4 mg at 09/10/15 9323  . hydrALAZINE (APRESOLINE) injection 10 mg  10 mg Intravenous Q4H PRN Corky Sox, MD   10 mg at 09/05/15 2320  . levETIRAcetam (KEPPRA) 500 mg in sodium chloride 0.9 % 100 mL IVPB  500 mg Intravenous Q12H Consuella Lose, MD   500 mg  at 09/09/15 2224  . morphine 2 MG/ML injection 1-2 mg  1-2 mg Intravenous Q2H PRN Consuella Lose, MD   1 mg at 09/09/15 1413  . ondansetron (ZOFRAN) tablet 4 mg  4 mg Oral Q4H PRN Consuella Lose, MD       Or  . ondansetron (ZOFRAN) injection 4 mg  4 mg Intravenous Q4H PRN Consuella Lose, MD   4 mg at 09/07/15 1820  . pantoprazole (PROTONIX) EC tablet 40 mg  40 mg Oral Q1200 Consuella Lose, MD      . promethazine (PHENERGAN) tablet 12.5-25 mg  12.5-25 mg Oral Q4H PRN Consuella Lose, MD      . sodium chloride flush (NS) 0.9 % injection 10-40 mL  10-40 mL Intracatheter PRN Consuella Lose, MD         Discharge Medications: Please see discharge  summary for a list of discharge medications.  Relevant Imaging Results:  Relevant Lab Results:   Additional Information SSN [id number]  Rozell Searing, LCSW

## 2015-09-10 NOTE — Progress Notes (Signed)
No issues overnight. Pt sitting at bedside chair eating lunch. Wife is at bedside who states he is improved today with both gait and speech.  EXAM:  BP 115/63 mmHg  Pulse 62  Temp(Src) 98.2 F (36.8 C) (Oral)  Resp 17  Wt 72.576 kg (160 lb)  SpO2 95%  Awake, alert Able to speak in longer sentences today, 4-5 words. Does continue to have many paraphasic errors. CN grossly intact  5/5 LUE/LLE 4 - 4+/5 RUE/RLE Wound c/d/i  IMPRESSION:  81 y.o. male POD# 5 resection of large right frontal tumor - Appears improved today, able to walk with rolling walker without assistance. Speech is subtly improved.  PLAN: - Cont mobilization with PT/OT - Cont SLP therapy  The patient appears to have made noticeable improvements since immediately postop. His speech and mobility seem improved. In addition, his postop MRI does NOT demonstrate any large area of infarction with the exception of the left caudate which is in the immediate peritumoral area. In the absence of a large stroke, there is no reason to think that his deficits are fixed and I believe he has an excellent potential for improvement with intensive therapy. Moreover, he appears more than well enough from a medical standpoint to be able to participate in therapy for at least 2 hours per day, 5 days a week. I discussed this with the patient's wife. All her questions were answered.

## 2015-09-10 NOTE — Clinical Social Work Note (Signed)
Clinical Social Worker has assessed patient and spouse at bedside. Full psychosocial assessment to follow.   CSW remains available as needed.   Glendon Axe, MSW, LCSWA [phone] 09/10/2015 3:19 PM

## 2015-09-11 ENCOUNTER — Encounter
Admission: RE | Admit: 2015-09-11 | Discharge: 2015-09-11 | Disposition: A | Discharge status: Home / Self Care | Payer: Medicare PPO | Source: Ambulatory Visit | Attending: Internal Medicine | Admitting: Internal Medicine

## 2015-09-11 DIAGNOSIS — Z794 Long term (current) use of insulin: Secondary | ICD-10-CM | POA: Insufficient documentation

## 2015-09-11 DIAGNOSIS — E119 Type 2 diabetes mellitus without complications: Secondary | ICD-10-CM | POA: Insufficient documentation

## 2015-09-11 LAB — RENAL FUNCTION PANEL
Albumin: 1.9 g/dL — ABNORMAL LOW (ref 3.5–5.0)
Anion gap: 9 (ref 5–15)
BUN: 23 mg/dL — AB (ref 6–20)
CALCIUM: 8.4 mg/dL — AB (ref 8.9–10.3)
CO2: 26 mmol/L (ref 22–32)
CREATININE: 0.78 mg/dL (ref 0.61–1.24)
Chloride: 104 mmol/L (ref 101–111)
Glucose, Bld: 110 mg/dL — ABNORMAL HIGH (ref 65–99)
Phosphorus: 3.2 mg/dL (ref 2.5–4.6)
Potassium: 4.4 mmol/L (ref 3.5–5.1)
SODIUM: 139 mmol/L (ref 135–145)

## 2015-09-11 LAB — CBC WITH DIFFERENTIAL/PLATELET
BASOS ABS: 0 10*3/uL (ref 0.0–0.1)
Basophils Relative: 0 %
EOS ABS: 0.1 10*3/uL (ref 0.0–0.7)
EOS PCT: 1 %
HCT: 32.7 % — ABNORMAL LOW (ref 39.0–52.0)
Hemoglobin: 11.2 g/dL — ABNORMAL LOW (ref 13.0–17.0)
Lymphocytes Relative: 7 %
Lymphs Abs: 1 10*3/uL (ref 0.7–4.0)
MCH: 30.9 pg (ref 26.0–34.0)
MCHC: 34.3 g/dL (ref 30.0–36.0)
MCV: 90.3 fL (ref 78.0–100.0)
Monocytes Absolute: 0.7 10*3/uL (ref 0.1–1.0)
Monocytes Relative: 5 %
Neutro Abs: 12.4 10*3/uL — ABNORMAL HIGH (ref 1.7–7.7)
Neutrophils Relative %: 87 %
PLATELETS: 158 10*3/uL (ref 150–400)
RBC: 3.62 MIL/uL — AB (ref 4.22–5.81)
RDW: 14.3 % (ref 11.5–15.5)
WBC: 14.2 10*3/uL — AB (ref 4.0–10.5)

## 2015-09-11 LAB — GLUCOSE, CAPILLARY
GLUCOSE-CAPILLARY: 113 mg/dL — AB (ref 65–99)
GLUCOSE-CAPILLARY: 121 mg/dL — AB (ref 65–99)

## 2015-09-11 LAB — TRIGLYCERIDES: Triglycerides: 79 mg/dL (ref <150)

## 2015-09-11 LAB — MAGNESIUM: MAGNESIUM: 2 mg/dL (ref 1.7–2.4)

## 2015-09-11 MED ORDER — CLOPIDOGREL BISULFATE 75 MG PO TABS: 75.0000 mg | ORAL_TABLET | Freq: Every morning | ORAL | Status: DC

## 2015-09-11 MED ORDER — LEVETIRACETAM 500 MG PO TABS: 500.0000 mg | ORAL_TABLET | Freq: Two times a day (BID) | ORAL | Status: DC

## 2015-09-11 MED ORDER — DEXAMETHASONE 1 MG PO TABS: 1.0000 mg | ORAL_TABLET | Freq: Two times a day (BID) | ORAL | Status: DC

## 2015-09-11 MED ORDER — DEXAMETHASONE 2 MG PO TABS: 2.0000 mg | ORAL_TABLET | Freq: Two times a day (BID) | ORAL | Status: DC

## 2015-09-11 NOTE — Clinical Social Work Note (Signed)
Clinical Social Work Assessment  Patient Details  Name: Jorge Maynard MRN: [id number] Date of Birth: 10-23-1934  Date of referral:  09/11/15               Reason for consult:  Facility Placement, Discharge Planning                Permission sought to share information with:  Family Supports, Customer service manager, Case Optician, dispensing granted to share information::  Yes, Verbal Permission Granted  Name::      (Christina Platner )  Agency::   (SNF's )  Relationship::   (Spouse )  Contact Information:   [phone])  Housing/Transportation Living arrangements for the past 2 months:  Single Family Home Source of Information:  Spouse Patient Interpreter Needed:  None Criminal Activity/Legal Involvement Pertinent to Current Situation/Hospitalization:  No - Comment as needed Significant Relationships:  Spouse Lives with:  Spouse Do you feel safe going back to the place where you live?  No Need for family participation in patient care:  No (Coment)  Care giving concerns:  Patient being considered for IP REHAB. Requiring SNF placement at this time due to insurance denial for IP  REHAB.    Social Worker assessment / plan: Holiday representative met with patient and wife at bedside in reference to post-acute placement for SNF. CSW introduced CSW role and SNF process. CSW also reviewed and provided SNF list. Pt and wife agreeable to SNF and prefers [facility] or [employer] IP REHAB and Attending MD having peer-to-peer with [facility] Medicare. No further concerns reported by pt's wife at this time. CSW will continue to follow pt and pt's family for continued support.   Employment status:  Retired Nurse, adult PT Recommendations:  Inpatient [facility] / Referral to community resources:  [employer]  Patient/Family's Response to care:  Pt alert and oriented x4 however does not fully participate in assessment. Pt's wife  agreeable to SNF at [facility]. Pt's wife involved in care and supportive. Pt and family pleasant and appreciated social work intervention.   Patient/Family's Understanding of and Emotional Response to Diagnosis, Current Treatment, and Prognosis:  Pt's wife understanding of medical interventions and appreciates care from unit staff.   Emotional Assessment Appearance:  Appears stated age Attitude/Demeanor/Rapport:    Affect (typically observed):  Accepting, Pleasant Orientation:  Oriented to Situation, Oriented to  Time, Oriented to Place, Oriented to Self Alcohol / Substance use:  Not Applicable Psych involvement (Current and /or in the community):  No (Comment)  Discharge Needs  Concerns to be addressed:  Care Coordination Readmission within the last 30 days:  No Current discharge risk:  Dependent with Mobility Barriers to Discharge:  [place], MSW, [place] [phone] 09/11/2015 2:54 PM

## 2015-09-11 NOTE — Clinical Documentation Improvement (Signed)
Neuro Surgery  Based on the clinical findings below, please document any associated diagnoses/conditions the patient has or may have.   Cerebral edema  Cerebral compression  Cerebral herniation  Other  Clinically Undetermined  Supporting Information: H&P:  IMGAING:  MRI demonstrates large left frontal peripherally enhancing tumor with surrounding edema  09/06/2015 MR Brain W Wo Contrast:IMPRESSION: Apparent total resection of a large left frontal metastatic lesion. Post resective space is filled with fluid, blood and air. Extradural fluid and air measuring 2.1 cm in thickness. Mild mass effect with left-to-right shift of 2 mm. Small amount of peri-resection infarction along the posterior and superior margin including involvement of the caudate head. Frontal horn of the left lateral ventricle may communicate with the resection cavity.  09/06/2015 : Repeat MRI today Continue Keppra 500 mg bid Continue Dexamethasone for edema  09/07/2015: Left frontal mass status post resection: Management per neurosurgery. Keppra 500 mg twice a day. Continuing Decadron.  09/09/15  "postop right hemparesis and worsening primarily expressive aphasia   Please exercise your independent, professional judgment when responding. A specific answer is not anticipated or expected. Please update your documentation within the medical record to reflect your response to this query. Thank you  Thank You, [place] [phone]

## 2015-09-11 NOTE — Clinical Social Work Placement (Signed)
   CLINICAL SOCIAL WORK PLACEMENT  NOTE  Date:  09/11/2015  Patient Details  Name: Jorge Maynard MRN: [id number] Date of Birth: May 06, 1935  Clinical Social Work is seeking post-discharge placement for this patient at the [place] level of care (*CSW will initial, date and re-position this form in  chart as items are completed):  Yes   Patient/family provided with [place] Work Department's list of facilities offering this level of care within the geographic area requested by the patient (or if unable, by the patient's family).  Yes   Patient/family informed of their freedom to choose among providers that offer the needed level of care, that participate in Medicare, Medicaid or managed care program needed by the patient, have an available bed and are willing to accept the patient.  Yes   Patient/family informed of [facility]'s ownership interest in [facility] and [facility], as well as of the fact that they are under no obligation to receive care at these facilities.  PASRR submitted to EDS on 09/10/15     PASRR number received on 09/10/15     Existing PASRR number confirmed on       FL2 transmitted to all facilities in geographic area requested by pt/family on 09/10/15     FL2 transmitted to all facilities within larger geographic area on 09/10/15     Patient informed that his/her managed care company has contracts with or will negotiate with certain facilities, including the following:        Yes   Patient/family informed of bed offers received.  Patient chooses bed at  [facility] )     Physician recommends and patient chooses bed at      Patient to be transferred to  [facility] ) on 09/11/15.  Patient to be transferred to facility by  [facility] )     Patient family notified on 09/11/15 of transfer.  Name of family member notified:   (Pt's wife, Margreta Journey )     PHYSICIAN Please sign FL2, Please prepare priority  discharge summary, including medications     Additional Comment:    _______________________________________________ Rozell Searing, LCSW 09/11/2015, 2:55 PM

## 2015-09-11 NOTE — Progress Notes (Signed)
Rehab admissions - A peer to peer between rehab MD Posey Pronto and medical director took place yesterday afternoon.  I have faxed additional updates to insurance case manager.  We should hear back soon regarding peer to peer appeal for inpatient rehab admission.  I will update all once I hear back from insurance case manager.  Call me for questions.  #[phone]

## 2015-09-11 NOTE — Progress Notes (Signed)
Rehab admissions - Peer to peer request for admit to inpatient rehab has been denied by [facility].  Recommend pursuit of SNF placement at this time.  Call me for questions.  #[phone]

## 2015-09-11 NOTE — Progress Notes (Signed)
Physical Therapy Treatment Patient Details Name: Jorge Maynard MRN: [id number] DOB: 22-Oct-1934 Today's Date: 09/11/2015    History of Present Illness Jorge Maynard is a 81 y.o. 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 He was also found to have lung metastases which were initially treated with surgical resection. He was then discovered to have more lung metastases which are treated with stereotactic radiosurgery. Over the last 2 weeks he and his wife have noted difficulty speaking. Pt underwent left frontotemporal craniotomy for tumor resection.    PT Comments    Noted neurosurgeon's note of 2/1 stating pt able to walk with RW without assistance. This is not accurate based on today's PT session. Although improved, pt continues with Rt lean, RLE shuffles/drags, pt ends up too close to Rt side of walker (Rt foot nearly entangles with Rt legs of RW). Due to expressive/receptive language issues, a second person was necessary for safety (to follow with chair in case he decides to sit/rest without warning).    Follow Up Recommendations  CIR     Equipment Recommendations  Other (comment) (TBD)    Recommendations for Other Services       Precautions / Restrictions Precautions Precautions: Fall Restrictions Weight Bearing Restrictions: No    Mobility  Bed Mobility Overal bed mobility: Needs Assistance Bed Mobility: Rolling;Sidelying to Sit Rolling: Supervision Sidelying to sit: Mod assist       General bed mobility comments: HOB flat, ended up using rail  Transfers Overall transfer level: Needs assistance Equipment used: Rolling walker (2 wheeled) Transfers: Sit to/from Stand Sit to Stand: Mod assist         General transfer comment: max cues (tactile, verbal, visual) with delayed response  Ambulation/Gait Ambulation/Gait assistance: Mod assist;+2 safety/equipment (follow with chair) Ambulation Distance  (Feet): 40 Feet (seated rest, 30) Assistive device: Rolling walker (2 wheeled) Gait Pattern/deviations: Step-through pattern;Decreased step length - right;Decreased weight shift to left;Shuffle Gait velocity: decreased   General Gait Details: patient advanceing RLE for very short step with some shuffling; assist to advance RLE and maintain balance (Rt lean with walker pushing to his Lt)   Stairs            Wheelchair Mobility    Modified Rankin (Stroke Patients Only)       Balance     Sitting balance-Leahy Scale: Fair Sitting balance - Comments: edge of chair     Standing balance-Leahy Scale: Poor                      Cognition Arousal/Alertness: Awake/alert Behavior During Therapy: WFL for tasks assessed/performed Overall Cognitive Status: Difficult to assess                 General Comments: pure verbal cues pt does not follow; with gestures/visual cues he needs incr time    Exercises General Exercises - Lower Extremity Long Arc Quad: AROM;Both;10 reps    General Comments General comments (skin integrity, edema, etc.): wife present      Pertinent Vitals/Pain Pain Assessment: No/denies pain    Home Living                      Prior Function            PT Goals (current goals can now be found in the care plan section) Acute Rehab PT Goals Patient Stated Goal: return home Time For  Goal Achievement: 09/22/15 Progress towards PT goals: Progressing toward goals    Frequency  Min 4X/week    PT Plan Current plan remains appropriate    Co-evaluation             End of Session Equipment Utilized During Treatment: Gait belt Activity Tolerance: Patient tolerated treatment well Patient left: in chair;with call bell/phone within reach;with chair alarm set;with family/visitor present     Time: 1005-1033 PT Time Calculation (min) (ACUTE ONLY): 28 min  Charges:  $Gait Training: 23-37 mins                    G Codes:       Breeanne Oblinger 2015-09-25, 10:46 AM Pager [phone]

## 2015-09-11 NOTE — Discharge Summary (Addendum)
Physician Discharge Summary  Patient ID: Jorge Maynard MRN: [id number] DOB/AGE: June 12, 1935 81 y.o.  Admit date: 09/05/2015 Discharge date: 09/11/2015  Admission Diagnoses: left frontal tumor  Discharge Diagnoses: Same Active Problems:   S/P craniotomy   Brain metastasis (HCC)   Debility   Leukocytosis   Acute blood loss anemia   Benign essential HTN   Coronary artery disease involving native coronary artery of native heart without angina pectoris   Aortic stenosis   Discharged Condition: Stable  Hospital Course:  Mrs. Jorge Maynard is a 81 y.o. male electively admitted after resection of a large left frontal tumor. He was monitored in the ICU for 2 days with mild right hemiparesis. He was extubated after MRI demonstrated good resection without any large territory stroke. He remained stable to improved with PT/OT and showed some subtle improvement in speech. He was denied for CIR and was therefore transferred to SNF for further care and therapy. He remained neurologically and hemodynamically stable.  Treatments: Surgery - Left frontal tumor resection  Discharge Exam: Blood pressure 112/52, pulse 79, temperature 97.9 F (36.6 C), temperature source Oral, resp. rate 20, weight 72.576 kg (160 lb), SpO2 97 %. Awake, alert,  Speech broken, expressive aphasia CN grossly intact 5/5 LUE/LLE 4/5 RUE/RLE Wound c/d/i  Disposition: SNF     Medication List    TAKE these medications        allopurinol 300 MG tablet  Commonly known as:  ZYLOPRIM  Take 300 mg by mouth daily.     amLODipine 10 MG tablet  Commonly known as:  NORVASC  Take 10 mg by mouth at bedtime.     aspirin 81 MG tablet  Take 81 mg by mouth daily.     atorvastatin 80 MG tablet  Commonly known as:  LIPITOR  Take 80 mg by mouth at bedtime.     carvedilol 6.25 MG tablet  Commonly known as:  COREG  Take 6.25 mg by mouth 2 (two) times daily with a meal.     chlorthalidone 25 MG tablet  Commonly known  as:  HYGROTON  Take 12.5 mg by mouth 2 (two) times daily.     clopidogrel 75 MG tablet  Commonly known as:  PLAVIX  Take 1 tablet (75 mg total) by mouth every morning.  Start taking on:  09/19/2015     dexamethasone 2 MG tablet  Commonly known as:  DECADRON  Take 1 tablet (2 mg total) by mouth 2 (two) times daily.     dexamethasone 1 MG tablet  Commonly known as:  DECADRON  Take 1 tablet (1 mg total) by mouth 2 (two) times daily with a meal. Start taking '1mg'$  tablets after completing the course of '2mg'$  tablets     ibuprofen 200 MG tablet  Commonly known as:  ADVIL,MOTRIN  Take 400 mg by mouth every 6 (six) hours as needed for moderate pain.     levETIRAcetam 500 MG tablet  Commonly known as:  KEPPRA  Take 1 tablet (500 mg total) by mouth 2 (two) times daily.     NEXIUM 24HR PO  Take 22.3 mg by mouth daily.     pseudoephedrine-guaifenesin 60-600 MG 12 hr tablet  Commonly known as:  MUCINEX D  Take 1 tablet by mouth every 12 (twelve) hours.     ramipril 10 MG capsule  Commonly known as:  ALTACE  Take 10 mg by mouth 2 (two) times daily.  Follow-up Information    Follow up with [facility] .   Specialty:  [place] information:   [address] [place] [phone]      Follow up with Consuella Lose, C, MD In 2 weeks.   Specialty:  Neurosurgery   Contact information:   [address] [place] [address] [phone]       Signed: Jairo Ben 09/11/2015, 4:01 PM

## 2015-09-11 NOTE — Progress Notes (Signed)
Speech Language Pathology Treatment: Cognitive-Linquistic  Patient Details Name: Jorge Maynard MRN: [id number] DOB: [birth year] Today's Date: 09/11/2015 Time: 0737-1062 SLP Time Calculation (min) (ACUTE ONLY): 18 min  Assessment / Plan / Recommendation Clinical Impression  Pt was seen for skilled ST targeting cognitive-linguistic goals.  Pt was able to answer yes/no questions in 9 out of 12 opportunities but was unable to correct errors despite max verbal and gestural cues.  Pt was able to follow 1-step commands in 4 out of 4 attempts; however he required max assist verbal and visual cues to follow 2 step commands in 1 out of 3 attempts.  Pt required max assist for responsive naming during structured tasks and his spontaneous verbal expression was characterized by nonfluent paraphasic and semantic errors.  Pt demonstrated limited awareness into verbal errors.  Pt was left in recliner with call bell left within reach.  Upon leaving the room pt spontaneously initiated request to turn TV on in a grammatically complete and semantically appropriate sentence.  Continue to recommend intensive ST follow up to address communication goals.     HPI HPI: Jorge Maynard is a 81 y.o. 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 He was also found to have lung metastases which were initially treated with surgical resection. He was then discovered to have more lung metastases which are treated with stereotactic radiosurgery. Over the last 2 weeks he and his wife have noted difficulty speaking. Pt underwent left frontotemporal craniotomy for tumor resection.      SLP Plan  Continue with current plan of care     Recommendations                Follow up Recommendations: Inpatient Rehab;24 hour supervision/assistance Plan: Continue with current plan of care     GO                Jorge Maynard, Selinda Orion 09/11/2015, 1:50 PM

## 2015-09-12 DIAGNOSIS — E119 Type 2 diabetes mellitus without complications: Secondary | ICD-10-CM | POA: Diagnosis not present

## 2015-09-12 LAB — MAGNESIUM: MAGNESIUM: 2 mg/dL (ref 1.7–2.4)

## 2015-09-12 LAB — CBC WITH DIFFERENTIAL/PLATELET
Basophils Absolute: 0 10*3/uL (ref 0.0–0.1)
Basophils Relative: 0 %
EOS ABS: 0 10*3/uL (ref 0.0–0.7)
EOS PCT: 0 %
HCT: 31.6 % — ABNORMAL LOW (ref 39.0–52.0)
HEMOGLOBIN: 11 g/dL — AB (ref 13.0–17.0)
LYMPHS ABS: 0.7 10*3/uL (ref 0.7–4.0)
Lymphocytes Relative: 5 %
MCH: 31.3 pg (ref 26.0–34.0)
MCHC: 34.8 g/dL (ref 30.0–36.0)
MCV: 89.8 fL (ref 78.0–100.0)
MONOS PCT: 4 %
Monocytes Absolute: 0.5 10*3/uL (ref 0.1–1.0)
NEUTROS PCT: 91 %
Neutro Abs: 12.8 10*3/uL — ABNORMAL HIGH (ref 1.7–7.7)
Platelets: 159 10*3/uL (ref 150–400)
RBC: 3.52 MIL/uL — ABNORMAL LOW (ref 4.22–5.81)
RDW: 14.5 % (ref 11.5–15.5)
WBC: 14 10*3/uL — ABNORMAL HIGH (ref 4.0–10.5)

## 2015-09-12 LAB — RENAL FUNCTION PANEL
Albumin: 1.9 g/dL — ABNORMAL LOW (ref 3.5–5.0)
Anion gap: 10 (ref 5–15)
BUN: 22 mg/dL — AB (ref 6–20)
CHLORIDE: 101 mmol/L (ref 101–111)
CO2: 27 mmol/L (ref 22–32)
CREATININE: 0.78 mg/dL (ref 0.61–1.24)
Calcium: 8.3 mg/dL — ABNORMAL LOW (ref 8.9–10.3)
GFR calc Af Amer: >60 mL/min (ref >60)
GFR calc non Af Amer: >60 mL/min (ref >60)
Glucose, Bld: 134 mg/dL — ABNORMAL HIGH (ref 65–99)
Phosphorus: 3.7 mg/dL (ref 2.5–4.6)
Potassium: 4.2 mmol/L (ref 3.5–5.1)
Sodium: 138 mmol/L (ref 135–145)

## 2015-09-12 LAB — GLUCOSE, CAPILLARY
GLUCOSE-CAPILLARY: 165 mg/dL — AB (ref 65–99)
Glucose-Capillary: 114 mg/dL — ABNORMAL HIGH (ref 65–99)
Glucose-Capillary: 118 mg/dL — ABNORMAL HIGH (ref 65–99)

## 2015-09-12 LAB — TRIGLYCERIDES: Triglycerides: 119 mg/dL (ref <150)

## 2015-09-12 NOTE — Progress Notes (Signed)
Hourly rounding performed. Call light within reach. Pt in no acute distress. Denies needs. Wife at bedside.  Pt asking if central line dressing can be removed so he can shave. Nurse will follow up.

## 2015-09-12 NOTE — Progress Notes (Signed)
Patient has accepted bed offer at [facility]. Facility has been informed of patient and family's selection. Per facility representative, patient can arrive to facility at anytime. CSW informed patient and family of transfer time. Patient and family appreciative of CSW services.  Patient to be transported via [facility] to [facility]. Discharge summary sent via Hub system. No further needs were requested at this time. CSW to sign off.   Please re-consult if further CSW needs arise.   Lucius Conn, [place] Worker [facility] Ph: [phone]

## 2015-09-12 NOTE — Care Management Important Message (Signed)
Important Message  Patient Details  Name: Jorge Maynard MRN: [id number] Date of Birth: April 16, 1935   Medicare Important Message Given:  Yes    [name] 09/12/2015, 4:11 PM

## 2015-09-12 NOTE — Clinical Social Work Placement (Signed)
   CLINICAL SOCIAL WORK PLACEMENT  NOTE  Date:  09/12/2015  Patient Details  Name: Jorge Maynard MRN: [id number] Date of Birth: September 26, 1934  Clinical Social Work is seeking post-discharge placement for this patient at the [place] level of care (*CSW will initial, date and re-position this form in  chart as items are completed):  Yes   Patient/family provided with [place] Work Department's list of facilities offering this level of care within the geographic area requested by the patient (or if unable, by the patient's family).  Yes   Patient/family informed of their freedom to choose among providers that offer the needed level of care, that participate in Medicare, Medicaid or managed care program needed by the patient, have an available bed and are willing to accept the patient.  Yes   Patient/family informed of [facility]'s ownership interest in [facility] and [facility], as well as of the fact that they are under no obligation to receive care at these facilities.  PASRR submitted to EDS on 09/10/15     PASRR number received on 09/10/15     Existing PASRR number confirmed on       FL2 transmitted to all facilities in geographic area requested by pt/family on 09/10/15     FL2 transmitted to all facilities within larger geographic area on 09/10/15     Patient informed that his/her managed care company has contracts with or will negotiate with certain facilities, including the following:        Yes   Patient/family informed of bed offers received.  Patient chooses bed at  [facility] )     Physician recommends and patient chooses bed at      Patient to be transferred to  [facility] ) on 09/12/15.  Patient to be transferred to facility by  [facility] )     Patient family notified on 09/12/15 of transfer.  Name of family member notified:   (Pt's wife, Margreta Journey )     PHYSICIAN       Additional Comment:     _______________________________________________ Raymondo Band, LCSW 09/12/2015, 3:04 PM

## 2015-09-12 NOTE — Progress Notes (Signed)
Discharge orders received. Pt and wife notified and verbalized understanding. Pt dressed and belongings packed. IV removed. [facility] picked up patient for transportation to [facility]. Writer called report to [facility].

## 2015-09-12 NOTE — Progress Notes (Signed)
Pt walked with 1 assist, walker, and gait belt from room 6 to nurses station and back. Pt stood at sink for 5 minutes washing face and private parts. Pt denied needing to use the bathroom at this time. Pt back in chair.

## 2015-09-12 NOTE — Progress Notes (Signed)
Pt attempting to get up out of chair, wife trying to hold pt in chair. Pt has become increasingly agitated in last hour. Dr. Kathyrn Sheriff to bedside speaking with wife and pt. PTAR will transfer pt to facility.  Rn and tech assisted pt with urinal. Pt ambulated with walker and +2 assist to sink to wash hands and private parts. Pt now back in bed. Bed alarm on.

## 2015-09-12 NOTE — Progress Notes (Signed)
Central line dressing removed. Site dry, no bleeding. Rn educated wife to not shave that part of neck near central line site.

## 2015-09-13 DIAGNOSIS — Z794 Long term (current) use of insulin: Secondary | ICD-10-CM | POA: Diagnosis not present

## 2015-09-13 DIAGNOSIS — E119 Type 2 diabetes mellitus without complications: Secondary | ICD-10-CM | POA: Diagnosis not present

## 2015-09-14 ENCOUNTER — Emergency Department: Payer: Medicare PPO

## 2015-09-14 ENCOUNTER — Encounter: Payer: Self-pay | Admitting: Radiology

## 2015-09-14 ENCOUNTER — Inpatient Hospital Stay
Admission: EM | Admit: 2015-09-14 | Discharge: 2015-09-22 | DRG: 180 | Disposition: A | Discharge status: Skilled Nursing Facility | Payer: Medicare PPO | Attending: Internal Medicine | Admitting: Internal Medicine

## 2015-09-14 DIAGNOSIS — D6869 Other thrombophilia: Secondary | ICD-10-CM | POA: Diagnosis present

## 2015-09-14 DIAGNOSIS — Z794 Long term (current) use of insulin: Secondary | ICD-10-CM | POA: Diagnosis not present

## 2015-09-14 DIAGNOSIS — K219 Gastro-esophageal reflux disease without esophagitis: Secondary | ICD-10-CM | POA: Diagnosis present

## 2015-09-14 DIAGNOSIS — C719 Malignant neoplasm of brain, unspecified: Secondary | ICD-10-CM | POA: Diagnosis present

## 2015-09-14 DIAGNOSIS — J189 Pneumonia, unspecified organism: Secondary | ICD-10-CM | POA: Diagnosis present

## 2015-09-14 DIAGNOSIS — J701 Chronic and other pulmonary manifestations due to radiation: Secondary | ICD-10-CM | POA: Diagnosis not present

## 2015-09-14 DIAGNOSIS — R4182 Altered mental status, unspecified: Secondary | ICD-10-CM | POA: Diagnosis not present

## 2015-09-14 DIAGNOSIS — I739 Peripheral vascular disease, unspecified: Secondary | ICD-10-CM | POA: Diagnosis present

## 2015-09-14 DIAGNOSIS — E871 Hypo-osmolality and hyponatremia: Secondary | ICD-10-CM | POA: Diagnosis present

## 2015-09-14 DIAGNOSIS — E119 Type 2 diabetes mellitus without complications: Secondary | ICD-10-CM | POA: Diagnosis present

## 2015-09-14 DIAGNOSIS — C7801 Secondary malignant neoplasm of right lung: Secondary | ICD-10-CM | POA: Diagnosis present

## 2015-09-14 DIAGNOSIS — I10 Essential (primary) hypertension: Secondary | ICD-10-CM | POA: Diagnosis present

## 2015-09-14 DIAGNOSIS — Z515 Encounter for palliative care: Secondary | ICD-10-CM | POA: Diagnosis not present

## 2015-09-14 DIAGNOSIS — R05 Cough: Secondary | ICD-10-CM | POA: Diagnosis not present

## 2015-09-14 DIAGNOSIS — E440 Moderate protein-calorie malnutrition: Secondary | ICD-10-CM | POA: Diagnosis present

## 2015-09-14 DIAGNOSIS — G936 Cerebral edema: Secondary | ICD-10-CM | POA: Diagnosis not present

## 2015-09-14 DIAGNOSIS — Z66 Do not resuscitate: Secondary | ICD-10-CM | POA: Diagnosis present

## 2015-09-14 DIAGNOSIS — I639 Cerebral infarction, unspecified: Secondary | ICD-10-CM | POA: Diagnosis present

## 2015-09-14 DIAGNOSIS — L8990 Pressure ulcer of unspecified site, unspecified stage: Secondary | ICD-10-CM | POA: Insufficient documentation

## 2015-09-14 DIAGNOSIS — Z9889 Other specified postprocedural states: Secondary | ICD-10-CM

## 2015-09-14 DIAGNOSIS — I2510 Atherosclerotic heart disease of native coronary artery without angina pectoris: Secondary | ICD-10-CM | POA: Diagnosis present

## 2015-09-14 DIAGNOSIS — Z87891 Personal history of nicotine dependence: Secondary | ICD-10-CM

## 2015-09-14 DIAGNOSIS — R269 Unspecified abnormalities of gait and mobility: Secondary | ICD-10-CM | POA: Diagnosis not present

## 2015-09-14 DIAGNOSIS — I6201 Nontraumatic acute subdural hemorrhage: Secondary | ICD-10-CM | POA: Diagnosis present

## 2015-09-14 DIAGNOSIS — A419 Sepsis, unspecified organism: Secondary | ICD-10-CM | POA: Diagnosis present

## 2015-09-14 DIAGNOSIS — J851 Abscess of lung with pneumonia: Secondary | ICD-10-CM

## 2015-09-14 DIAGNOSIS — C760 Malignant neoplasm of head, face and neck: Secondary | ICD-10-CM | POA: Insufficient documentation

## 2015-09-14 DIAGNOSIS — C499 Malignant neoplasm of connective and soft tissue, unspecified: Secondary | ICD-10-CM | POA: Diagnosis not present

## 2015-09-14 DIAGNOSIS — C7931 Secondary malignant neoplasm of brain: Secondary | ICD-10-CM | POA: Diagnosis not present

## 2015-09-14 DIAGNOSIS — I69851 Hemiplegia and hemiparesis following other cerebrovascular disease affecting right dominant side: Secondary | ICD-10-CM | POA: Diagnosis not present

## 2015-09-14 DIAGNOSIS — R531 Weakness: Secondary | ICD-10-CM

## 2015-09-14 LAB — LACTIC ACID, PLASMA: LACTIC ACID, VENOUS: 2 mmol/L (ref 0.5–2.0)

## 2015-09-14 LAB — COMPREHENSIVE METABOLIC PANEL
ALBUMIN: 2.5 g/dL — AB (ref 3.5–5.0)
ALK PHOS: 121 U/L (ref 38–126)
ALT: 80 U/L — AB (ref 17–63)
AST: 29 U/L (ref 15–41)
Anion gap: 10 (ref 5–15)
BUN: 28 mg/dL — AB (ref 6–20)
CALCIUM: 8.2 mg/dL — AB (ref 8.9–10.3)
CO2: 24 mmol/L (ref 22–32)
CREATININE: 0.9 mg/dL (ref 0.61–1.24)
Chloride: 100 mmol/L — ABNORMAL LOW (ref 101–111)
GFR calc Af Amer: >60 mL/min (ref >60)
GFR calc non Af Amer: >60 mL/min (ref >60)
GLUCOSE: 139 mg/dL — AB (ref 65–99)
Potassium: 3.9 mmol/L (ref 3.5–5.1)
SODIUM: 134 mmol/L — AB (ref 135–145)
Total Bilirubin: 0.6 mg/dL (ref 0.3–1.2)
Total Protein: 5.7 g/dL — ABNORMAL LOW (ref 6.5–8.1)

## 2015-09-14 LAB — CBC WITH DIFFERENTIAL/PLATELET
BASOS PCT: 0 %
Basophils Absolute: 0.1 10*3/uL (ref 0–0.1)
EOS ABS: 0.3 10*3/uL (ref 0–0.7)
Eosinophils Relative: 2 %
HEMATOCRIT: 35.1 % — AB (ref 40.0–52.0)
Hemoglobin: 11.5 g/dL — ABNORMAL LOW (ref 13.0–18.0)
Lymphocytes Relative: 5 %
Lymphs Abs: 0.7 10*3/uL — ABNORMAL LOW (ref 1.0–3.6)
MCH: 30 pg (ref 26.0–34.0)
MCHC: 32.6 g/dL (ref 32.0–36.0)
MCV: 92 fL (ref 80.0–100.0)
MONO ABS: 0.6 10*3/uL (ref 0.2–1.0)
MONOS PCT: 4 %
Neutro Abs: 12.1 10*3/uL — ABNORMAL HIGH (ref 1.4–6.5)
Neutrophils Relative %: 89 %
Platelets: 180 10*3/uL (ref 150–440)
RBC: 3.82 MIL/uL — ABNORMAL LOW (ref 4.40–5.90)
RDW: 14.8 % — AB (ref 11.5–14.5)
WBC: 13.8 10*3/uL — ABNORMAL HIGH (ref 3.8–10.6)

## 2015-09-14 LAB — URINALYSIS COMPLETE WITH MICROSCOPIC (ARMC ONLY)
Bilirubin Urine: NEGATIVE
Glucose, UA: NEGATIVE mg/dL
Hgb urine dipstick: NEGATIVE
KETONES UR: NEGATIVE mg/dL
Leukocytes, UA: NEGATIVE
Nitrite: NEGATIVE
PROTEIN: NEGATIVE mg/dL
RBC / HPF: NONE SEEN RBC/hpf (ref 0–5)
SQUAMOUS EPITHELIAL / LPF: NONE SEEN
Specific Gravity, Urine: 1.014 (ref 1.005–1.030)
pH: 6 (ref 5.0–8.0)

## 2015-09-14 LAB — GLUCOSE, CAPILLARY
GLUCOSE-CAPILLARY: 99 mg/dL (ref 65–99)
GLUCOSE-CAPILLARY: 127 mg/dL — AB (ref 65–99)
GLUCOSE-CAPILLARY: 141 mg/dL — AB (ref 65–99)
Glucose-Capillary: 233 mg/dL — ABNORMAL HIGH (ref 65–99)
Glucose-Capillary: 98 mg/dL (ref 65–99)
Glucose-Capillary: 133 mg/dL — ABNORMAL HIGH (ref 65–99)

## 2015-09-14 MED ORDER — RAMIPRIL 10 MG PO CAPS
10.0000 mg | ORAL_CAPSULE | Freq: Two times a day (BID) | ORAL | Status: DC
  Administered 2015-09-15 – 2015-09-17 (×5): 10 mg via ORAL
  Filled 2015-09-14 (×5): qty 1

## 2015-09-14 MED ORDER — AMLODIPINE BESYLATE 10 MG PO TABS
10.0000 mg | ORAL_TABLET | Freq: Every day | ORAL | Status: DC
  Administered 2015-09-15 – 2015-09-16 (×2): 10 mg via ORAL
  Filled 2015-09-14 (×2): qty 1

## 2015-09-14 MED ORDER — ACETAMINOPHEN 325 MG PO TABS: 650.0000 mg | ORAL_TABLET | Freq: Four times a day (QID) | ORAL | Status: DC | PRN

## 2015-09-14 MED ORDER — ATORVASTATIN CALCIUM 20 MG PO TABS
80.0000 mg | ORAL_TABLET | Freq: Every day | ORAL | Status: DC
  Administered 2015-09-15 – 2015-09-17 (×4): 80 mg via ORAL
  Filled 2015-09-14 (×4): qty 4

## 2015-09-14 MED ORDER — SODIUM CHLORIDE 0.9 % IV BOLUS (SEPSIS)
1000.0000 mL | Freq: Once | INTRAVENOUS | Status: AC
  Administered 2015-09-14: 1000 mL via INTRAVENOUS

## 2015-09-14 MED ORDER — IPRATROPIUM-ALBUTEROL 0.5-2.5 (3) MG/3ML IN SOLN: 3.0000 mL | RESPIRATORY_TRACT | Status: DC | PRN

## 2015-09-14 MED ORDER — PANTOPRAZOLE SODIUM 40 MG PO TBEC
40.0000 mg | DELAYED_RELEASE_TABLET | Freq: Every day | ORAL | Status: DC
  Administered 2015-09-15 – 2015-09-18 (×4): 40 mg via ORAL
  Filled 2015-09-14 (×4): qty 1

## 2015-09-14 MED ORDER — PIPERACILLIN-TAZOBACTAM 3.375 G IVPB
3.3750 g | Freq: Three times a day (TID) | INTRAVENOUS | Status: DC
Start: 2015-09-15 — End: 2015-09-16
  Administered 2015-09-15 – 2015-09-16 (×4): 3.375 g via INTRAVENOUS
  Filled 2015-09-14 (×6): qty 50

## 2015-09-14 MED ORDER — SODIUM CHLORIDE 0.9 % IV SOLN
INTRAVENOUS | Status: AC
  Administered 2015-09-15 (×2): via INTRAVENOUS

## 2015-09-14 MED ORDER — VANCOMYCIN HCL IN DEXTROSE 1-5 GM/200ML-% IV SOLN
1000.0000 mg | Freq: Once | INTRAVENOUS | Status: AC
  Administered 2015-09-14: 1000 mg via INTRAVENOUS

## 2015-09-14 MED ORDER — ONDANSETRON HCL 4 MG PO TABS: 4.0000 mg | ORAL_TABLET | Freq: Four times a day (QID) | ORAL | Status: DC | PRN

## 2015-09-14 MED ORDER — SODIUM CHLORIDE 0.9 % IV BOLUS (SEPSIS)
500.0000 mL | Freq: Once | INTRAVENOUS | Status: AC
  Administered 2015-09-14: 500 mL via INTRAVENOUS

## 2015-09-14 MED ORDER — ALLOPURINOL 100 MG PO TABS
300.0000 mg | ORAL_TABLET | Freq: Every day | ORAL | Status: DC
  Administered 2015-09-15 – 2015-09-18 (×4): 300 mg via ORAL
  Filled 2015-09-14 (×4): qty 3

## 2015-09-14 MED ORDER — CARVEDILOL 6.25 MG PO TABS
6.2500 mg | ORAL_TABLET | Freq: Two times a day (BID) | ORAL | Status: DC
  Administered 2015-09-15 – 2015-09-17 (×5): 6.25 mg via ORAL
  Filled 2015-09-14 (×5): qty 1

## 2015-09-14 MED ORDER — LEVETIRACETAM 500 MG PO TABS
500.0000 mg | ORAL_TABLET | Freq: Two times a day (BID) | ORAL | Status: DC
  Administered 2015-09-15 – 2015-09-17 (×7): 500 mg via ORAL
  Filled 2015-09-14 (×8): qty 1

## 2015-09-14 MED ORDER — IOHEXOL 300 MG/ML  SOLN
75.0000 mL | Freq: Once | INTRAMUSCULAR | Status: AC | PRN
  Administered 2015-09-14: 75 mL via INTRAVENOUS

## 2015-09-14 MED ORDER — ACETAMINOPHEN 650 MG RE SUPP: 650.0000 mg | Freq: Four times a day (QID) | RECTAL | Status: DC | PRN

## 2015-09-14 MED ORDER — ONDANSETRON HCL 4 MG/2ML IJ SOLN: 4.0000 mg | Freq: Four times a day (QID) | INTRAMUSCULAR | Status: DC | PRN

## 2015-09-14 MED ORDER — PIPERACILLIN-TAZOBACTAM 3.375 G IVPB 30 MIN
3.3750 g | Freq: Once | INTRAVENOUS | Status: AC
  Administered 2015-09-14: 3.375 g via INTRAVENOUS
  Filled 2015-09-14: qty 50

## 2015-09-14 MED ORDER — VANCOMYCIN HCL IN DEXTROSE 750-5 MG/150ML-% IV SOLN
750.0000 mg | Freq: Two times a day (BID) | INTRAVENOUS | Status: DC
  Administered 2015-09-15: 750 mg via INTRAVENOUS
  Filled 2015-09-14 (×2): qty 150

## 2015-09-14 MED ORDER — ACETAMINOPHEN 650 MG RE SUPP
650.0000 mg | Freq: Once | RECTAL | Status: AC
  Administered 2015-09-14: 650 mg via RECTAL

## 2015-09-14 MED ORDER — SODIUM CHLORIDE 0.9% FLUSH
3.0000 mL | Freq: Two times a day (BID) | INTRAVENOUS | Status: DC
  Administered 2015-09-15 – 2015-09-22 (×15): 3 mL via INTRAVENOUS

## 2015-09-14 MED ORDER — VANCOMYCIN HCL IN DEXTROSE 1-5 GM/200ML-% IV SOLN
INTRAVENOUS | Status: AC
  Filled 2015-09-14: qty 200

## 2015-09-14 MED ORDER — ASPIRIN 81 MG PO CHEW
81.0000 mg | CHEWABLE_TABLET | Freq: Every day | ORAL | Status: DC
  Administered 2015-09-15 – 2015-09-18 (×4): 81 mg via ORAL
  Filled 2015-09-14 (×4): qty 1

## 2015-09-14 NOTE — ED Notes (Signed)
Pt from [facility] with recent left craniotomy area swelling and fever. Pt is alert, but does not answer questions appropriately. Pt moving all extermities, strong congested cough noted. Skin pink, hot dry. Pt with approx 24 cm stapled "u" shaped surgical incision to left lateral skull/frontal skull.

## 2015-09-14 NOTE — ED Notes (Signed)
Patient transported to CT 

## 2015-09-14 NOTE — Progress Notes (Signed)
Pharmacy Antibiotic Note  Jorge Maynard is a 81 y.o. male admitted on 09/14/2015 with sepsis.  Pharmacy has been consulted for Zosyn/vancomycin dosing.  Plan: 1. Zosyn 3.375 gm IV Q8H 2. Vancomycin 1 gm IV x 1 in ED followed in 6 hr (stacked dosing) by 750 mg IV Q12H, predicted trough 15 mcg/mL.Vd 49 L, Ke 0.06 hr-1, T1/2 11.6 hr. Pharmacy will continue to follow and adjust as needed to maintain trough 15 to 20 mcg/mL.  Weight: 159 lb 6.3 oz (72.3 kg)  Temp (24hrs), Avg:99.9 F (37.7 C), Min:99 F (37.2 C), Max:100.8 F (38.2 C)   Recent Labs Lab 09/09/15 1130 09/10/15 0450 09/10/15 0455 09/11/15 0416 09/12/15 0440 09/14/15 2045 09/14/15 2047  WBC 17.2*  --  13.1* 14.2* 14.0*  --  13.8*  CREATININE 0.79 0.75  --  0.78 0.78  --  0.90  LATICACIDVEN  --   --   --   --   --  2.0  --     Estimated Creatinine Clearance: 65.4 mL/min (by C-G formula based on Cr of 0.9).    No Known Allergies  Thank you for allowing pharmacy to be a part of this patient's care.  Laural Benes, Pharm.D., BCPS Clinical Pharmacist 09/14/2015 10:51 PM

## 2015-09-14 NOTE — ED Provider Notes (Signed)
CSN: [id number]     Arrival date & time 09/14/15  2035 History   First MD Initiated Contact with Patient 09/14/15 2036     Chief Complaint  Patient presents with  . Code Sepsis     (Consider location/radiation/quality/duration/timing/severity/associated sxs/prior Treatment) The history is provided by the patient, the nursing home and the EMS personnel.  Jorge Maynard is a 81 y.o. 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 Ralene Ok here with fever. He was discharged from [facility] days ago and is currently in a nursing home. Nursing home noticed more swelling in the L craniotomy site but no purulent drainage. Patient was noted to be confused but nursing home doesn't know if it is baseline. Has some productive cough as well but didn't notice any vomiting. EMS noticed fever 102 but nursing didn't report any fevers.   Level V caveat- condition of patient    Past Medical History  Diagnosis Date  . Sarcoma ([place]) 12/31/2014  . Hypertension   . Coronary artery disease   . Peripheral vascular disease ([place])   . GERD (gastroesophageal reflux disease)   . Gout   . Aortic stenosis   . Carotid stenosis   . Pathologic myopia, both eyes   . Posterior staphyloma   . 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 Malignant neoplasm of skin   . Memory changes   . Enlarged prostate   . Arthritis   . Complication of anesthesia   . PONV (postoperative nausea and vomiting)   . Aortic stenosis     moderate on 12/2013 echo   Past Surgical History  Procedure Laterality Date  . Colonoscopy N/A 01/07/2015    Procedure: COLONOSCOPY;  Surgeon: Lollie Sails, MD;  Location: [facility] ENDOSCOPY;  Service: Endoscopy;  Laterality: N/A;  . Coronary angioplasty      stents - 2 different times  . Eye surgery Bilateral     cataract surgery with lens implant  . Lung cancer surgery      2015   . Rotator cuff repair Right   . Fracture surgery    .  Tendon repair Left     elbow  . Vasectomy    . Craniotomy Right 09/05/2015    Procedure: Stereotactic Right Craniotomy for resection of tumor with BrainLab;  Surgeon: Consuella Lose, MD;  Location: [facility] NEURO ORS;  Service: Neurosurgery;  Laterality: Right;  Stereotactic Right Craniotomy for resection of tumor with BrainLab  . Application of cranial navigation N/A 09/05/2015    Procedure: APPLICATION OF CRANIAL NAVIGATION;  Surgeon: Consuella Lose, MD;  Location: [place] NEURO ORS;  Service: Neurosurgery;  Laterality: N/A;   Family History  Problem Relation Age of Onset  . Heart disease Mother   . Heart disease Father    Social History  Substance Use Topics  . Smoking status: Former Smoker    Quit date: 08/08/1965  . Smokeless tobacco: Never Used  . Alcohol Use: Yes     Comment: social    Review of Systems  Unable to perform ROS: Mental status change  All other systems reviewed and are negative.     Allergies  Review of patient's allergies indicates no known allergies.  Home Medications   Prior to Admission medications   Medication Sig Start Date End Date Taking? Authorizing Provider  allopurinol (ZYLOPRIM) 300 MG tablet Take 300 mg by mouth daily.   Yes Historical Provider, MD  amLODipine (  NORVASC) 10 MG tablet Take 10 mg by mouth at bedtime.    Yes Historical Provider, MD  aspirin 81 MG tablet Take 81 mg by mouth daily.   Yes Historical Provider, MD  atorvastatin (LIPITOR) 80 MG tablet Take 80 mg by mouth at bedtime.    Yes Historical Provider, MD  carvedilol (COREG) 6.25 MG tablet Take 6.25 mg by mouth 2 (two) times daily with a meal.   Yes Historical Provider, MD  chlorthalidone (HYGROTON) 25 MG tablet Take 12.5 mg by mouth 2 (two) times daily.   Yes Historical Provider, MD  dexamethasone (DECADRON) 2 MG tablet Take 1 tablet (2 mg total) by mouth 2 (two) times daily. 09/11/15  Yes Consuella Lose, MD  Esomeprazole Magnesium (NEXIUM 24HR PO) Take 22.3 mg by mouth daily.    Yes Historical Provider, MD  ibuprofen (ADVIL,MOTRIN) 200 MG tablet Take 400 mg by mouth every 6 (six) hours as needed for moderate pain.   Yes Historical Provider, MD  levETIRAcetam (KEPPRA) 500 MG tablet Take 1 tablet (500 mg total) by mouth 2 (two) times daily. 09/11/15  Yes Consuella Lose, MD  pseudoephedrine-guaifenesin (MUCINEX D) 60-600 MG 12 hr tablet Take 1 tablet by mouth every 12 (twelve) hours.   Yes Historical Provider, MD  ramipril (ALTACE) 10 MG capsule Take 10 mg by mouth 2 (two) times daily.   Yes Historical Provider, MD  clopidogrel (PLAVIX) 75 MG tablet Take 1 tablet (75 mg total) by mouth every morning. Patient not taking: Reported on 09/14/2015 09/19/15   Consuella Lose, MD   BP 131/57 mmHg  Pulse 65  Temp(Src) 100.8 F (38.2 C) (Rectal)  Resp 22  Wt 159 lb 6.3 oz (72.3 kg)  SpO2 94% Physical Exam  Constitutional:  Ill appearing   HENT:  Head: Normocephalic.  Swelling L craniotomy site but no ecchymosis or purulent discharge   Eyes: Conjunctivae are normal. Pupils are equal, round, and reactive to light.  Neck: Normal range of motion. Neck supple.  Cardiovascular: Normal rate, regular rhythm and normal heart sounds.   Pulmonary/Chest:  Diminished bilaterally   Abdominal: Soft. Bowel sounds are normal. He exhibits no distension. There is no tenderness. There is no rebound.  Musculoskeletal: Normal range of motion. He exhibits no edema or tenderness.  Neurological:  Confused. Moving all extremities. Difficult following commands   Skin: Skin is warm and dry.  Psychiatric:  Unable   Nursing note and vitals reviewed.   ED Course  Procedures (including critical care time) Labs Review Labs Reviewed  COMPREHENSIVE METABOLIC PANEL - Abnormal; Notable for the following:    Sodium 134 (*)    Chloride 100 (*)    Glucose, Bld 139 (*)    BUN 28 (*)    Calcium 8.2 (*)    Total Protein 5.7 (*)    Albumin 2.5 (*)    ALT 80 (*)    All other components within  normal limits  CBC WITH DIFFERENTIAL/PLATELET - Abnormal; Notable for the following:    WBC 13.8 (*)    RBC 3.82 (*)    Hemoglobin 11.5 (*)    HCT 35.1 (*)    RDW 14.8 (*)    Neutro Abs 12.1 (*)    Lymphs Abs 0.7 (*)    All other components within normal limits  URINALYSIS COMPLETEWITH MICROSCOPIC ([facility] ONLY) - Abnormal; Notable for the following:    Color, Urine YELLOW (*)    APPearance CLEAR (*)    Bacteria, UA RARE (*)  All other components within normal limits  CULTURE, BLOOD (ROUTINE X 2)  CULTURE, BLOOD (ROUTINE X 2)  URINE CULTURE  LACTIC ACID, PLASMA  LACTIC ACID, PLASMA    Imaging Review Ct Head Wo Contrast  09/14/2015  CLINICAL DATA:  Craniotomy 2 weeks ago, swelling at site, fever. History of sarcoma, hypertension, skin neoplasm. EXAM: CT HEAD WITHOUT CONTRAST TECHNIQUE: Contiguous axial images were obtained from the base of the skull through the vertex without intravenous contrast. COMPARISON:  CT head September 06, 2015 FINDINGS: Patient is status post LEFT frontal craniotomy for known metastatic lesion. Decreased mass effect with partial re-expansion of LEFT frontal horn of the lateral ventricle. Fluid-filled resection cavity with residual intraparenchymal pneumocephalus. Local mass effect without midline shift. Focal low density in the LEFT basal ganglia corresponding to edema on prior study. Mild chronic small vessel ischemic disease, better characterized on prior MRI. No acute large vascular territory infarcts. LEFT frontal extra-axial fluid collection is decreased in size, previously 2.2 cm, now 1 cm, with persistent extra-axial pneumocephalus. Mild calcific atherosclerosis of the carotid siphons. Enlarging LEFT frontal scalp low-density fluid collection measuring up to 13 mm transaxial dimension with overlying skin staples. No subcutaneous gas. 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 Paranasal sinuses and mastoid air cells are well  aerated. IMPRESSION: Status post recent LEFT frontal craniotomy for resection of metastatic lesion with expected postoperative changes, decreasing extra-axial fluid collection. Increased LEFT frontal scalp fluid collection, though this may represent pseudomeningocele, abscess could have a similar appearance. Consider sampling. Electronically Signed   By: Elon Alas M.D.   On: 09/14/2015 21:48   Dg Chest Port 1 View  09/14/2015  CLINICAL DATA:  Patient with recent left craniotomy area swelling and fever. He is alert, but does not answer RN questions appropriately and cannot follow breathing instructions .Hx sarcoma 2016, cad, htn, PVD, GERD, aortic/carotid stenosis. EXAM: PORTABLE CHEST 1 VIEW COMPARISON:  09/06/2015, 08/27/2015 FINDINGS: Heart is enlarged. There is elevation of the right hemidiaphragm. Patchy density is noted in the left upper lobe, similar in appearance to prior studies. There is increased opacity in the right upper lobe with possible air-fluid level indicating a cavitary space. IMPRESSION: 1. Increased opacity in the right upper lobe with possible cavitary lesion in an area of suspected metastatic lesion. Consider PA and lateral views and/or CT of the chest. 2. Persistent patchy density in the left upper lobe, stable. 3. Stable cardiomegaly. Electronically Signed   By: Nolon Nations M.D.   On: 09/14/2015 21:30   I have personally reviewed and evaluated these images and lab results as part of my medical decision-making.   EKG Interpretation None       ED ECG REPORT I, Alayssa Flinchum, the attending physician, personally viewed and interpreted this ECG.   Date: 09/14/2015  EKG Time: 20:40  Rate: 63  Rhythm: normal EKG, normal sinus rhythm  Axis: normal  Intervals:none  ST&T Change: none   MDM   Final diagnoses:  None   Kamil Mchaffie Gatley is a 81 y.o. male here with AMS, fever. Code sepsis activated. Consider UTI vs pneumonia vs intracranial infection. Will get labs,  lactate, CXR, CT head, UA. Will start broad spectrum abx.   10:07 PM CT head showed fluid collection. Consulted Dr. Vertell Limber from neurosurgery at [facility], who doesn't want to tap. Thinks likely postop. He has pneumonia as well. Will give IV abx for now. Possible cavitary lesion that I think likely abscess. CT ordered. Will admit.  Merrilyn Puma, MD 09/14/15 2209

## 2015-09-14 NOTE — H&P (Signed)
[facility] [place] NAME: Jorge Maynard    MR#:  [id number]  DATE OF BIRTH:  [birth year]  DATE OF ADMISSION:  09/14/2015  PRIMARY CARE PHYSICIAN: Rusty Aus., MD   REQUESTING/REFERRING PHYSICIAN: Darl Householder, MD  CHIEF COMPLAINT:   Chief Complaint  Patient presents with  . Code Sepsis    HISTORY OF PRESENT ILLNESS:  Jorge Maynard  is a 81 y.o. male who presents from nursing facility with confusion and productive cough. Patient was recently admitted and [place] and underwent neurosurgical intervention for a frontal lobe tumor, which was resected via craniotomy without complication. Here in the ED he was found to have leukocytosis, and likely right upper lobe pneumonia on imaging. He was also febrile and tachypnea. Hospitalists were called for admission for sepsis due to healthcare associated pneumonia  PAST MEDICAL HISTORY:   Past Medical History  Diagnosis Date  . Sarcoma ([place]) 12/31/2014  . Hypertension   . Coronary artery disease   . Peripheral vascular disease ([place])   . GERD (gastroesophageal reflux disease)   . Gout   . Aortic stenosis   . Carotid stenosis   . Pathologic myopia, both eyes   . Posterior staphyloma   . 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 Malignant neoplasm of skin   . Memory changes   . Enlarged prostate   . Arthritis   . Complication of anesthesia   . PONV (postoperative nausea and vomiting)   . Aortic stenosis     moderate on 12/2013 echo    PAST SURGICAL HISTORY:   Past Surgical History  Procedure Laterality Date  . Colonoscopy N/A 01/07/2015    Procedure: COLONOSCOPY;  Surgeon: Lollie Sails, MD;  Location: [facility] ENDOSCOPY;  Service: Endoscopy;  Laterality: N/A;  . Coronary angioplasty      stents - 2 different times  . Eye surgery Bilateral     cataract surgery with lens implant  . Lung cancer surgery      2015   . Rotator cuff repair Right   . Fracture surgery    . Tendon  repair Left     elbow  . Vasectomy    . Craniotomy Right 09/05/2015    Procedure: Stereotactic Right Craniotomy for resection of tumor with BrainLab;  Surgeon: Consuella Lose, MD;  Location: [facility] NEURO ORS;  Service: Neurosurgery;  Laterality: Right;  Stereotactic Right Craniotomy for resection of tumor with BrainLab  . Application of cranial navigation N/A 09/05/2015    Procedure: APPLICATION OF CRANIAL NAVIGATION;  Surgeon: Consuella Lose, MD;  Location: [facility] NEURO ORS;  Service: Neurosurgery;  Laterality: N/A;    SOCIAL HISTORY:   Social History  Substance Use Topics  . Smoking status: Former Smoker    Quit date: 08/08/1965  . Smokeless tobacco: Never Used  . Alcohol Use: Yes     Comment: social    FAMILY HISTORY:   Family History  Problem Relation Age of Onset  . Heart disease Mother   . Heart disease Father     DRUG ALLERGIES:  No Known Allergies  MEDICATIONS AT HOME:   Prior to Admission medications   Medication Sig Start Date End Date Taking? Authorizing Provider  allopurinol (ZYLOPRIM) 300 MG tablet Take 300 mg by mouth daily.   Yes Historical Provider, MD  amLODipine (NORVASC) 10 MG tablet Take 10 mg by mouth at bedtime.    Yes Historical Provider, MD  aspirin 81 MG tablet Take 81 mg  by mouth daily.   Yes Historical Provider, MD  atorvastatin (LIPITOR) 80 MG tablet Take 80 mg by mouth at bedtime.    Yes Historical Provider, MD  carvedilol (COREG) 6.25 MG tablet Take 6.25 mg by mouth 2 (two) times daily with a meal.   Yes Historical Provider, MD  chlorthalidone (HYGROTON) 25 MG tablet Take 12.5 mg by mouth 2 (two) times daily.   Yes Historical Provider, MD  dexamethasone (DECADRON) 2 MG tablet Take 1 tablet (2 mg total) by mouth 2 (two) times daily. 09/11/15  Yes Consuella Lose, MD  Esomeprazole Magnesium (NEXIUM 24HR PO) Take 22.3 mg by mouth daily.   Yes Historical Provider, MD  ibuprofen (ADVIL,MOTRIN) 200 MG tablet Take 400 mg by mouth every 6 (six) hours as  needed for moderate pain.   Yes Historical Provider, MD  levETIRAcetam (KEPPRA) 500 MG tablet Take 1 tablet (500 mg total) by mouth 2 (two) times daily. 09/11/15  Yes Consuella Lose, MD  pseudoephedrine-guaifenesin (MUCINEX D) 60-600 MG 12 hr tablet Take 1 tablet by mouth every 12 (twelve) hours.   Yes Historical Provider, MD  ramipril (ALTACE) 10 MG capsule Take 10 mg by mouth 2 (two) times daily.   Yes Historical Provider, MD  clopidogrel (PLAVIX) 75 MG tablet Take 1 tablet (75 mg total) by mouth every morning. Patient not taking: Reported on 09/14/2015 09/19/15   Consuella Lose, MD    REVIEW OF SYSTEMS:  Review of Systems  Constitutional: Negative for fever, chills, weight loss and malaise/fatigue.  HENT: Negative for ear pain, hearing loss and tinnitus.   Eyes: Negative for blurred vision, double vision, pain and redness.  Respiratory: Positive for cough and sputum production. Negative for hemoptysis and shortness of breath.   Cardiovascular: Negative for chest pain, palpitations, orthopnea and leg swelling.  Gastrointestinal: Negative for nausea, vomiting, abdominal pain, diarrhea and constipation.  Genitourinary: Negative for dysuria, frequency and hematuria.  Musculoskeletal: Negative for back pain, joint pain and neck pain.  Skin:       No acne, rash, or lesions  Neurological: Negative for dizziness, tremors, focal weakness and weakness.  Endo/Heme/Allergies: Negative for polydipsia. Does not bruise/bleed easily.  Psychiatric/Behavioral: Negative for depression. The patient is not nervous/anxious and does not have insomnia.      VITAL SIGNS:   Filed Vitals:   09/14/15 2115 09/14/15 2200 09/14/15 2212 09/14/15 2215  BP: 131/57 135/59    Pulse: 65 62  69  Temp:   99 F (37.2 C)   TempSrc:   Oral   Resp: '22 20  22  '$ Weight:      SpO2: 94% 94%  95%   Wt Readings from Last 3 Encounters:  09/14/15 72.3 kg (159 lb 6.3 oz)  09/05/15 72.576 kg (160 lb)  09/02/15 72.666 kg (160  lb 3.2 oz)    PHYSICAL EXAMINATION:  Physical Exam  Vitals reviewed. Constitutional: He appears well-developed and well-nourished. No distress.  HENT:  Head: Normocephalic.  Mouth/Throat: Oropharynx is clear and moist.  Staples in place from left frontal craniotomy  Eyes: Conjunctivae and EOM are normal. Pupils are equal, round, and reactive to light. No scleral icterus.  Neck: Normal range of motion. Neck supple. No JVD present. No thyromegaly present.  Cardiovascular: Normal rate, regular rhythm and intact distal pulses.  Exam reveals no gallop and no friction rub.   No murmur heard. Respiratory: Effort normal and breath sounds normal. No respiratory distress. He has no wheezes. He has no rales.  GI: Soft. Bowel  sounds are normal. He exhibits no distension. There is no tenderness.  Musculoskeletal: Normal range of motion. He exhibits no edema.  No arthritis, no gout  Lymphadenopathy:    He has no cervical adenopathy.  Neurological: He is alert. No cranial nerve deficit.  Does not speak much, responds only with short answers to simple questions. No dysarthria, no aphasia  Skin: Skin is warm and dry. No rash noted. No erythema.  Psychiatric:  Unable to assess due to patient condition    LABORATORY PANEL:   CBC  Recent Labs Lab 09/14/15 2047  WBC 13.8*  HGB 11.5*  HCT 35.1*  PLT 180   ------------------------------------------------------------------------------------------------------------------  Chemistries   Recent Labs Lab 09/12/15 0440 09/14/15 2047  NA 138 134*  K 4.2 3.9  CL 101 100*  CO2 27 24  GLUCOSE 134* 139*  BUN 22* 28*  CREATININE 0.78 0.90  CALCIUM 8.3* 8.2*  MG 2.0  --   AST  --  29  ALT  --  80*  ALKPHOS  --  121  BILITOT  --  0.6   ------------------------------------------------------------------------------------------------------------------  Cardiac Enzymes No results for input(s): TROPONINI in the last 168  hours. ------------------------------------------------------------------------------------------------------------------  RADIOLOGY:  Ct Head Wo Contrast  09/14/2015  CLINICAL DATA:  Craniotomy 2 weeks ago, swelling at site, fever. History of sarcoma, hypertension, skin neoplasm. EXAM: CT HEAD WITHOUT CONTRAST TECHNIQUE: Contiguous axial images were obtained from the base of the skull through the vertex without intravenous contrast. COMPARISON:  CT head September 06, 2015 FINDINGS: Patient is status post LEFT frontal craniotomy for known metastatic lesion. Decreased mass effect with partial re-expansion of LEFT frontal horn of the lateral ventricle. Fluid-filled resection cavity with residual intraparenchymal pneumocephalus. Local mass effect without midline shift. Focal low density in the LEFT basal ganglia corresponding to edema on prior study. Mild chronic small vessel ischemic disease, better characterized on prior MRI. No acute large vascular territory infarcts. LEFT frontal extra-axial fluid collection is decreased in size, previously 2.2 cm, now 1 cm, with persistent extra-axial pneumocephalus. Mild calcific atherosclerosis of the carotid siphons. Enlarging LEFT frontal scalp low-density fluid collection measuring up to 13 mm transaxial dimension with overlying skin staples. No subcutaneous gas. 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 Paranasal sinuses and mastoid air cells are well aerated. IMPRESSION: Status post recent LEFT frontal craniotomy for resection of metastatic lesion with expected postoperative changes, decreasing extra-axial fluid collection. Increased LEFT frontal scalp fluid collection, though this may represent pseudomeningocele, abscess could have a similar appearance. Consider sampling. Electronically Signed   By: Elon Alas M.D.   On: 09/14/2015 21:48   Dg Chest Port 1 View  09/14/2015  CLINICAL DATA:  Patient with recent left craniotomy  area swelling and fever. He is alert, but does not answer RN questions appropriately and cannot follow breathing instructions .Hx sarcoma 2016, cad, htn, PVD, GERD, aortic/carotid stenosis. EXAM: PORTABLE CHEST 1 VIEW COMPARISON:  09/06/2015, 08/27/2015 FINDINGS: Heart is enlarged. There is elevation of the right hemidiaphragm. Patchy density is noted in the left upper lobe, similar in appearance to prior studies. There is increased opacity in the right upper lobe with possible air-fluid level indicating a cavitary space. IMPRESSION: 1. Increased opacity in the right upper lobe with possible cavitary lesion in an area of suspected metastatic lesion. Consider PA and lateral views and/or CT of the chest. 2. Persistent patchy density in the left upper lobe, stable. 3. Stable cardiomegaly. Electronically Signed   By: Nolon Nations  M.D.   On: 09/14/2015 21:30    EKG:   Orders placed or performed during the hospital encounter of 09/14/15  . ED EKG 12-Lead  . ED EKG 12-Lead  . EKG 12-Lead  . EKG 12-Lead    IMPRESSION AND PLAN:  Principal Problem:   Sepsis ([place]) - lactic acid normal but borderline high, hemodynamically stable, IV antibiotics started in the ED, blood and urine cultures sent, sputum culture ordered. Continue antibiotics and follow cultures. Gentle hydration, trend lactic acid for at least one more draw to ensure stability. Active Problems:   HCAP (healthcare-associated pneumonia) - antibiotics and cultures as above   Essential (primary) hypertension - stable, continue home meds   Peripheral vascular disease ([place]) - continue home meds   S/P craniotomy - seems to be healing well without signs or symptoms of infection at this time. Continue home meds including Decadron.   Coronary artery disease involving native coronary artery of native heart without angina pectoris - continue home meds   GERD (gastroesophageal reflux disease) - home dose PPI  All the records are reviewed and case  discussed with ED provider. Management plans discussed with the patient and/or family.  DVT PROPHYLAXIS: SubQ lovenox  GI PROPHYLAXIS: PPI  ADMISSION STATUS: Inpatient  CODE STATUS: Full Code Status History    Date Active Date Inactive Code Status Order ID Comments User Context   09/05/2015  8:54 PM 09/12/2015  7:55 PM Full Code [id number]  Consuella Lose, MD Inpatient      TOTAL TIME TAKING CARE OF THIS PATIENT: 45 minutes.    Arnulfo Batson [place] 09/14/2015, 10:38 PM  [facility]  Office  [phone]  CC: Primary care physician; Rusty Aus., MD

## 2015-09-14 NOTE — ED Notes (Signed)
Pt placed on airborne precautions per dr. Darl Householder.

## 2015-09-14 NOTE — ED Notes (Signed)
Breath sounds unchanged, pt remains alert, answers "yes" to questions. Moving all extremities.

## 2015-09-14 NOTE — ED Notes (Signed)
Pt states "no" when asked if he has pain or needs anything right now. Pt updated on admission and delay on bed process. Skin pwd. resps unlabored and breath sounds unchanged from initial assessment.

## 2015-09-14 NOTE — ED Notes (Signed)
Report previously given to carmen, rn on floor. Pt not able to move to unit at this time due to inavailability of airborne precautions bed. Will await ct results to confirm pt needs airborne precautions.

## 2015-09-15 DIAGNOSIS — J701 Chronic and other pulmonary manifestations due to radiation: Secondary | ICD-10-CM

## 2015-09-15 DIAGNOSIS — L8990 Pressure ulcer of unspecified site, unspecified stage: Secondary | ICD-10-CM | POA: Insufficient documentation

## 2015-09-15 DIAGNOSIS — Z9889 Other specified postprocedural states: Secondary | ICD-10-CM

## 2015-09-15 DIAGNOSIS — E440 Moderate protein-calorie malnutrition: Secondary | ICD-10-CM | POA: Diagnosis present

## 2015-09-15 LAB — BASIC METABOLIC PANEL
Anion gap: 7 (ref 5–15)
BUN: 22 mg/dL — ABNORMAL HIGH (ref 6–20)
CALCIUM: 8.2 mg/dL — AB (ref 8.9–10.3)
CO2: 24 mmol/L (ref 22–32)
CREATININE: 0.65 mg/dL (ref 0.61–1.24)
Chloride: 104 mmol/L (ref 101–111)
GFR calc Af Amer: >60 mL/min (ref >60)
GLUCOSE: 105 mg/dL — AB (ref 65–99)
Potassium: 3.8 mmol/L (ref 3.5–5.1)
Sodium: 135 mmol/L (ref 135–145)

## 2015-09-15 LAB — CBC
HCT: 35.4 % — ABNORMAL LOW (ref 40.0–52.0)
Hemoglobin: 11.6 g/dL — ABNORMAL LOW (ref 13.0–18.0)
MCH: 30.1 pg (ref 26.0–34.0)
MCHC: 32.9 g/dL (ref 32.0–36.0)
MCV: 91.6 fL (ref 80.0–100.0)
PLATELETS: 170 10*3/uL (ref 150–440)
RBC: 3.87 MIL/uL — ABNORMAL LOW (ref 4.40–5.90)
RDW: 15 % — AB (ref 11.5–14.5)
WBC: 12.2 10*3/uL — ABNORMAL HIGH (ref 3.8–10.6)

## 2015-09-15 LAB — EXPECTORATED SPUTUM ASSESSMENT W REFEX TO RESP CULTURE

## 2015-09-15 LAB — EXPECTORATED SPUTUM ASSESSMENT W GRAM STAIN, RFLX TO RESP C

## 2015-09-15 LAB — GLUCOSE, CAPILLARY: GLUCOSE-CAPILLARY: 158 mg/dL — AB (ref 65–99)

## 2015-09-15 LAB — LACTIC ACID, PLASMA: Lactic Acid, Venous: 1.4 mmol/L (ref 0.5–2.0)

## 2015-09-15 LAB — PROCALCITONIN: Procalcitonin: <0.1 ng/mL

## 2015-09-15 MED ORDER — DOCUSATE SODIUM 100 MG PO CAPS
100.0000 mg | ORAL_CAPSULE | Freq: Two times a day (BID) | ORAL | Status: DC
  Administered 2015-09-15 – 2015-09-18 (×6): 100 mg via ORAL
  Filled 2015-09-15 (×6): qty 1

## 2015-09-15 MED ORDER — DEXAMETHASONE 4 MG PO TABS
2.0000 mg | ORAL_TABLET | Freq: Two times a day (BID) | ORAL | Status: DC
  Administered 2015-09-15 – 2015-09-17 (×5): 2 mg via ORAL
  Filled 2015-09-15 (×3): qty 1
  Filled 2015-09-15: qty 2
  Filled 2015-09-15: qty 1

## 2015-09-15 MED ORDER — ENOXAPARIN SODIUM 40 MG/0.4ML ~~LOC~~ SOLN
40.0000 mg | SUBCUTANEOUS | Status: DC
Start: 2015-09-15 — End: 2015-09-18
  Administered 2015-09-15 – 2015-09-18 (×4): 40 mg via SUBCUTANEOUS
  Filled 2015-09-15 (×4): qty 0.4

## 2015-09-15 MED ORDER — MAGNESIUM HYDROXIDE 400 MG/5ML PO SUSP
30.0000 mL | Freq: Once | ORAL | Status: AC
  Administered 2015-09-15: 30 mL via ORAL
  Filled 2015-09-15: qty 30

## 2015-09-15 MED ORDER — ENSURE ENLIVE PO LIQD
237.0000 mL | Freq: Two times a day (BID) | ORAL | Status: DC
  Administered 2015-09-15 – 2015-09-18 (×6): 237 mL via ORAL

## 2015-09-15 NOTE — Plan of Care (Signed)
Problem: Education: Goal: Knowledge of [facility] Education information/materials will improve Outcome: Progressing Wife at bedside and  Education to her  Problem: Safety: Goal: Ability to remain free from injury will improve Outcome: Progressing Free from injury  Problem: Skin Integrity: Goal: Risk for impaired skin integrity will decrease Outcome: Progressing No further breakdown. Pink foam in place  Problem: Fluid Volume: Goal: Ability to maintain a balanced intake and output will improve Outcome: Progressing Pt drinking well.  ivfs d/cd this pm

## 2015-09-15 NOTE — Evaluation (Signed)
Clinical/Bedside Swallow Evaluation Patient Details  Name: Jorge Maynard MRN: [id number] Date of Birth: 1934-08-20  Today's Date: 09/15/2015 Time: SLP Start Time (ACUTE ONLY): 1200 SLP Stop Time (ACUTE ONLY): 1300 SLP Time Calculation (min) (ACUTE ONLY): 60 min  Past Medical History:  Past Medical History  Diagnosis Date  . 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 Hypertension   . Coronary artery disease   . Peripheral vascular disease ([place])   . GERD (gastroesophageal reflux disease)   . Gout   . Aortic stenosis   . Carotid stenosis   . Pathologic myopia, both eyes   . Posterior staphyloma   . 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 Malignant neoplasm of skin   . Memory changes   . Enlarged prostate   . Arthritis   . Complication of anesthesia   . PONV (postoperative nausea and vomiting)   . Aortic stenosis     moderate on 12/2013 echo   Past Surgical History:  Past Surgical History  Procedure Laterality Date  . Colonoscopy N/A 01/07/2015    Procedure: COLONOSCOPY;  Surgeon: Lollie Sails, MD;  Location: [facility] ENDOSCOPY;  Service: Endoscopy;  Laterality: N/A;  . Coronary angioplasty      stents - 2 different times  . Eye surgery Bilateral     cataract surgery with lens implant  . Lung cancer surgery      2015   . Rotator cuff repair Right   . Fracture surgery    . Tendon repair Left     elbow  . Vasectomy    . Craniotomy Right 09/05/2015    Procedure: Stereotactic Right Craniotomy for resection of tumor with BrainLab;  Surgeon: Consuella Lose, MD;  Location: [facility] NEURO ORS;  Service: Neurosurgery;  Laterality: Right;  Stereotactic Right Craniotomy for resection of tumor with BrainLab  . Application of cranial navigation N/A 09/05/2015    Procedure: APPLICATION OF CRANIAL NAVIGATION;  Surgeon: Consuella Lose, MD;  Location: [place] NEURO ORS;  Service: Neurosurgery;  Laterality: N/A;   HPI:  Pt is a 81 y.o. 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 He was also found to have lung metastases which were initially treated with surgical resection. He was then discovered to have more lung metastases which are treated with stereotactic radiosurgery. Over the last 2 weeks he and his wife have noted difficulty speaking. Pt underwent left frontotemporal craniotomy for tumor resection. He was admitted from the SNF on 09/14/15 w/ c/o cough. In the ED he was found to have leukocytosis, and likely right upper lobe pneumonia on imaging. He was also febrile and tachypnea. Hospitalists were called for admission for sepsis due to healthcare associated pneumonia. Pt has been on a regular diet per recent history w/ no c/o s/s of aspiration, however, MD requested further assessment d/t this recent pneumonia presentation.    Assessment / Plan / Recommendation Clinical Impression  Pt appeared to safely and adequate tolerate trials of thin liquids and soft solids w/ no overt s/s of aspiration noted; clear vocal quality and no decline in respiratory status w/ po intake during/post session. No overt oral phase deficits noted; pt cleared appropriately b/t all trials w/ solids. Pt fed self w/ setup assistance. However, noted impulsive feeding behavior - methodic eating and at times not clearing his mouth b/f putting more food in his mouth. Gave moderate verbal cues to take rest breaks, alternate foods/liquids. Suspect this could be  related to his recent surgical intervention and Cognitive decline. Rec. a dys. 3 w/ thin liquids w/ aspiration precautions; meds in puree for easier swallowing. Tray setup and positioning at meals. Rec. monitoring at meals for impulsive eating behavior and to give cues to follow strategies. NSG to reconsult if any change in status noted.     Aspiration Risk   (reduced following aspiration precautions)    Diet Recommendation  Dys. 3 w/ thin liquids; monitoring during meals for follow through w/ strategies; aspiration  precautions  Medication Administration: Whole meds with puree    Other  Recommendations Recommended Consults:  (Dietician) Oral Care Recommendations: Oral care BID;Staff/trained caregiver to provide oral care   Follow up Recommendations  Skilled Nursing facility    Frequency and Duration min 3x week  1 week       Prognosis Prognosis for Safe Diet Advancement: Fair (-good) Barriers to Reach Goals: Cognitive deficits;Language deficits      Swallow Study   General HPI: Pt is a 81 y.o. 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 He was also found to have lung metastases which were initially treated with surgical resection. He was then discovered to have more lung metastases which are treated with stereotactic radiosurgery. Over the last 2 weeks he and his wife have noted difficulty speaking. Pt underwent left frontotemporal craniotomy for tumor resection. He was admitted from the SNF on 09/14/15 w/ c/o cough. In the ED he was found to have leukocytosis, and likely right upper lobe pneumonia on imaging. He was also febrile and tachypnea. Hospitalists were called for admission for sepsis due to healthcare associated pneumonia. Pt has been on a regular diet per recent history w/ no c/o s/s of aspiration, however, MD requested further assessment d/t this recent pneumonia presentation.  Type of Study: Bedside Swallow Evaluation Diet Prior to this Study: Regular;Thin liquids (recent hospitalization; Inpt rehab) Temperature Spikes Noted:  (temp 100.8; wbc 13.8, 12.2) Respiratory Status: Room air History of Recent Intubation: No Behavior/Cognition: Alert;Cooperative;Pleasant mood;Distractible;Requires cueing (Cognitive-Linguistic deficits) Oral Cavity Assessment: Within Functional Limits Oral Care Completed by SLP: Yes Oral Cavity - Dentition: Missing dentition;Poor condition (fair) Vision: Functional for self-feeding Self-Feeding  Abilities: Able to feed self (impulisive eating behavior noted) Patient Positioning: Upright in bed Baseline Vocal Quality: Normal;Low vocal intensity (min. verbal) Volitional Cough: Strong (w/ cues) Volitional Swallow: Able to elicit (w/ cues)    Oral/Motor/Sensory Function Overall Oral Motor/Sensory Function: Within functional limits (grossly)   Ice Chips Ice chips: Within functional limits Presentation: Spoon (fed; 3 trials)   Thin Liquid Thin Liquid: Within functional limits Presentation: Cup;Self Fed (~6 ozs total)    Nectar Thick Nectar Thick Liquid: Not tested   Honey Thick Honey Thick Liquid: Not tested   Puree Puree: Within functional limits Presentation: Self Fed;Spoon (4+ ozs ) Other Comments: impulsive   Solid   GO   Solid: Within functional limits (soft solid - chopped) Presentation: Self Fed;Spoon (4+ ozs) Other Comments: impulsive        Orinda Kenner, MS, CCC-SLP  Watson,Katherine 09/15/2015,4:16 PM

## 2015-09-15 NOTE — NC FL2 (Signed)
[place] LEVEL OF CARE SCREENING TOOL     IDENTIFICATION  Patient Name: Jorge Maynard Birthdate: [birth year] Sex: male Admission Date (Current Location): 09/14/2015  [place] and [place] Number:  Engineering geologist and Address:  [facility], [address], [place], [place] [address]      Provider Number: [id number]  Attending Physician Name and Address:  Max Sane, MD  Relative Name and Phone Number:       Current Level of Care: Hospital Recommended Level of Care: [place] Prior Approval Number:    Date Approved/Denied:   PASRR Number:  [id number])  Discharge Plan: SNF    Current Diagnoses: Patient Active Problem List   Diagnosis Date Noted  . Pressure ulcer 09/15/2015  . Sepsis ([place]) 09/14/2015  . HCAP (healthcare-associated pneumonia) 09/14/2015  . GERD (gastroesophageal reflux disease) 09/14/2015  . Debility   . Leukocytosis   . Acute blood loss anemia   . Coronary artery disease involving native coronary artery of native heart without angina pectoris   . Aortic stenosis   . S/P craniotomy 09/05/2015  . Brain metastasis ([place]) 09/05/2015  . 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 Malignant neoplasm metastatic to lung ([place]) 09/10/2014  . Carotid artery narrowing 03/26/2014  . Degenerative myopia 03/25/2014  . Aortic heart valve narrowing 10/13/2011  . CAD in native artery 10/13/2011  . Essential (primary) hypertension 10/13/2011  . HLD (hyperlipidemia) 10/13/2011  . Peripheral vascular disease ([place]) 10/13/2011    Orientation RESPIRATION BLADDER Height & Weight     Self  Normal Incontinent Weight: 152 lb 4.8 oz (69.083 kg) Height:  '5\' 8"'$  (172.7 cm)  BEHAVIORAL SYMPTOMS/MOOD NEUROLOGICAL BOWEL NUTRITION STATUS   (None)  (None ) Incontinent Diet (DYS 3)  AMBULATORY STATUS COMMUNICATION OF NEEDS Skin   Extensive Assist Verbally Other (Comment) (Pressure Ulcer stage 2- Sacrum & Closed Incision- Head )                        Personal Care Assistance Level of Assistance  Bathing, Feeding, Dressing Bathing Assistance: Limited assistance Feeding assistance: Independent Dressing Assistance: Limited assistance     Functional Limitations Info  Sight, Hearing, Speech Sight Info: Adequate Hearing Info: Adequate Speech Info: Adequate    SPECIAL CARE FACTORS FREQUENCY  PT (By licensed PT)     PT Frequency:  (5)              Contractures      Additional Factors Info  Code Status, Allergies Code Status Info:  (Full Code ) Allergies Info:  (None )           Current Medications (09/15/2015):  This is the current hospital active medication list Current Facility-Administered Medications  Medication Dose Route Frequency Provider Last Rate Last Dose  . acetaminophen (TYLENOL) tablet 650 mg  650 mg Oral Q6H PRN Lance Coon, MD       Or  . acetaminophen (TYLENOL) suppository 650 mg  650 mg Rectal Q6H PRN Lance Coon, MD      . allopurinol (ZYLOPRIM) tablet 300 mg  300 mg Oral Daily Lance Coon, MD   300 mg at 09/15/15 1119  . amLODipine (NORVASC) tablet 10 mg  10 mg Oral QHS Lance Coon, MD      . aspirin chewable tablet 81 mg  81 mg Oral Daily Lance Coon, MD   81 mg at 09/15/15 1120  . atorvastatin (LIPITOR) tablet 80 mg  80 mg Oral QHS  Lance Coon, MD   80 mg at 09/15/15 0043  . carvedilol (COREG) tablet 6.25 mg  6.25 mg Oral BID WC Lance Coon, MD   6.25 mg at 09/15/15 0803  . dexamethasone (DECADRON) tablet 2 mg  2 mg Oral BID WC Lance Coon, MD   2 mg at 09/15/15 0803  . enoxaparin (LOVENOX) injection 40 mg  40 mg Subcutaneous Q24H Gladstone Lighter, MD      . feeding supplement (ENSURE ENLIVE) (ENSURE ENLIVE) liquid 237 mL  237 mL Oral BID BM Vipul Shah, MD      . ipratropium-albuterol (DUONEB) 0.5-2.5 (3) MG/3ML nebulizer solution 3 mL  3 mL Nebulization Q2H PRN Lance Coon, MD      . levETIRAcetam (KEPPRA) tablet 500 mg  500 mg Oral BID Lance Coon, MD   500 mg at  09/15/15 1120  . ondansetron (ZOFRAN) tablet 4 mg  4 mg Oral Q6H PRN Lance Coon, MD       Or  . ondansetron [facility]) injection 4 mg  4 mg Intravenous Q6H PRN Lance Coon, MD      . pantoprazole (PROTONIX) EC tablet 40 mg  40 mg Oral QAC breakfast Lance Coon, MD   40 mg at 09/15/15 8563  . piperacillin-tazobactam (ZOSYN) IVPB 3.375 g  3.375 g Intravenous 3 times per day Lance Coon, MD   3.375 g at 09/15/15 1254  . ramipril (ALTACE) capsule 10 mg  10 mg Oral BID Lance Coon, MD   10 mg at 09/15/15 1119  . sodium chloride flush (NS) 0.9 % injection 3 mL  3 mL Intravenous Q12H Lance Coon, MD   3 mL at 09/15/15 1130     Discharge Medications: Please see discharge summary for a list of discharge medications.  Relevant Imaging Results:  Relevant Lab Results:   Additional Information  (SSN [id number])  Lorenso Quarry Sunkins, LCSW

## 2015-09-15 NOTE — Evaluation (Signed)
Physical Therapy Evaluation Patient Details Name: Jorge Maynard MRN: [id number] DOB: 08/24/1934 Today's Date: 09/15/2015   History of Present Illness  Patient is a 81 y/o male that recently underwent L frontotemporal craniotomy at [facility] for tumor resection. He has previously undergone stereotatic radiation for lung mets. He developed some confusion and non-productive cough while at SNF.   Clinical Impression  Patient has recently undergone fronto-temporal craniotomy and was noted to have aphasic tendencies while in rehab at [facility]. He was able to ambulate with assistance and RW while at [facility], however he is unable to follow verbal or visual commands in this session, and is not able to sit unsupported for more than 15-30 seconds. It is hoped that this is a transient decline in his cognitive status, PT will continue to attempt to mobilize patient the hope that he will be able to more fully participate in sessions.     Follow Up Recommendations SNF    Equipment Recommendations       Recommendations for Other Services       Precautions / Restrictions Precautions Precautions: Fall Restrictions Weight Bearing Restrictions: No      Mobility  Bed Mobility Overal bed mobility: Needs Assistance Bed Mobility: Supine to Sit;Sit to Supine   Sidelying to sit: Max assist Supine to sit: Max assist     General bed mobility comments: Patient is unable to follow commands consistently and therefore does not follow instructions for sitting.   Transfers                    Ambulation/Gait                Stairs            Wheelchair Mobility    Modified Rankin (Stroke Patients Only)       Balance Overall balance assessment: Needs assistance Sitting-balance support: Bilateral upper extremity supported Sitting balance-Leahy Scale: Zero Sitting balance - Comments: Patient required max A x 1 to maintain sitting balance due to posterior lean. He was able to sit  at EOB for roughly 15-30 seconds without assist.                                      Pertinent Vitals/Pain Faces Pain Scale: No hurt    Home Living Family/patient expects to be discharged to:: Skilled nursing facility   Available Help at Discharge: Family;Available 24 hours/day Type of Home: House Home Access: Stairs to enter Entrance Stairs-Rails: None Entrance Stairs-Number of Steps: 3 Home Layout: Two level;Able to live on main level with bedroom/bathroom Home Equipment: Shower seat - built in Additional Comments: wife was an Therapist, sports, pt has Engineer, production background    Prior Function Level of Independence: Independent               Hand Dominance   Dominant Hand: Right    Extremity/Trunk Assessment   Upper Extremity Assessment: Difficult to assess due to impaired cognition           Lower Extremity Assessment: Difficult to assess due to impaired cognition      Cervical / Trunk Assessment: Kyphotic  Communication   Communication: Expressive difficulties;Receptive difficulties (Unclear if expressive difficulties only, but certainly unable to consistently correctly follow commands)  Cognition Arousal/Alertness: Awake/alert Behavior During Therapy: Flat affect Overall Cognitive Status: Impaired/Different from baseline Area of Impairment: Following commands;Problem solving;Safety/judgement;Memory;Orientation Orientation Level: Disoriented to;Place;Time;Situation  Memory: Decreased short-term memory Following Commands:  (Does not appear to follow commands) Safety/Judgement: Decreased awareness of safety;Decreased awareness of deficits   Problem Solving: Slow processing;Decreased initiation;Difficulty sequencing;Requires verbal cues General Comments: PT attempted both verbal and physical gesturing, which patient was not able to follow adequately.     General Comments      Exercises        Assessment/Plan    PT Assessment Patient needs  continued PT services  PT Diagnosis Difficulty walking;Abnormality of gait;Generalized weakness;Hemiplegia dominant side;Altered mental status   PT Problem List Decreased strength;Decreased range of motion;Decreased activity tolerance;Decreased balance;Decreased mobility;Decreased coordination;Decreased cognition;Decreased knowledge of use of DME;Decreased safety awareness;Decreased knowledge of precautions  PT Treatment Interventions DME instruction;Gait training;Functional mobility training;Therapeutic activities;Therapeutic exercise;Balance training;Neuromuscular re-education;Cognitive remediation;Patient/family education   PT Goals (Current goals can be found in the Care Plan section) Acute Rehab PT Goals PT Goal Formulation: Patient unable to participate in goal setting Time For Goal Achievement: 09/29/15 Potential to Achieve Goals: Fair    Frequency Min 2X/week   Barriers to discharge        Co-evaluation               End of Session   Activity Tolerance: Other (comment) (Cognitive status/confusion) Patient left: in bed;with call bell/phone within reach;with bed alarm set Nurse Communication: Mobility status         Time: 1401-1410 PT Time Calculation (min) (ACUTE ONLY): 9 min   Charges:   PT Evaluation $PT Eval Moderate Complexity: 1 Procedure     PT G Codes:       Kerman Passey, PT, DPT    09/15/2015, 5:44 PM

## 2015-09-15 NOTE — Progress Notes (Signed)
Initial Nutrition Assessment  DOCUMENTATION CODES:   Non-severe (moderate) malnutrition in context of acute illness/injury  INTERVENTION:   Meals and Snacks: Cater to patient preferences Medical Food Supplement Therapy: will recommend Ensure Enlive po BID, each supplement provides 350 kcal and 20 grams of protein   NUTRITION DIAGNOSIS:   Malnutrition related to acute illness as evidenced by percent weight loss, mild depletion of body fat, moderate depletions of muscle mass.  GOAL:   Patient will meet greater than or equal to 90% of their needs  MONITOR:    (Energy Intake, Electrolyte and renal Profile, Anthropometrics, Digestive system)  REASON FOR ASSESSMENT:    (Pressure Ulcer)    ASSESSMENT:   Pt admitted with sepsis. Pt with h/o poorly differentiated sarcoma with mets to lung and brain with recent resection to frontal lob via craniotomy on 1/27. Pt discharged from [facility] 3 days PTA.  Past Medical History  Diagnosis Date  . 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 Hypertension   . Coronary artery disease   . Peripheral vascular disease ([place])   . GERD (gastroesophageal reflux disease)   . Gout   . Aortic stenosis   . Carotid stenosis   . Pathologic myopia, both eyes   . Posterior staphyloma   . 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 Malignant neoplasm of skin   . Memory changes   . Enlarged prostate   . Arthritis   . Complication of anesthesia   . PONV (postoperative nausea and vomiting)   . Aortic stenosis     moderate on 12/2013 echo     Diet Order:  DIET DYS 3 Room service appropriate?: Yes with Assist; Fluid consistency:: Thin    Current Nutrition: Pt diet order just advanced prior to visit, pt had eaten 100% of lunch.   Food/Nutrition-Related History: Pt's wife reports pt has had a good appetite eating well PTA. Pt reports not having had any supplements PTA.   Scheduled Medications:  . allopurinol  300 mg Oral Daily  . amLODipine  10 mg Oral QHS  . aspirin   81 mg Oral Daily  . atorvastatin  80 mg Oral QHS  . carvedilol  6.25 mg Oral BID WC  . dexamethasone  2 mg Oral BID WC  . enoxaparin (LOVENOX) injection  40 mg Subcutaneous Q24H  . feeding supplement (ENSURE ENLIVE)  237 mL Oral BID BM  . levETIRAcetam  500 mg Oral BID  . pantoprazole  40 mg Oral QAC breakfast  . piperacillin-tazobactam (ZOSYN)  IV  3.375 g Intravenous 3 times per day  . ramipril  10 mg Oral BID  . sodium chloride flush  3 mL Intravenous Q12H      Electrolyte/Renal Profile and Glucose Profile:   Recent Labs Lab 09/10/15 0450 09/11/15 0416 09/12/15 0440 09/14/15 2047 09/15/15 0622  NA 138 139 138 134* 135  K 4.0 4.4 4.2 3.9 3.8  CL 103 104 101 100* 104  CO2 '26 26 27 24 24  '$ BUN 21* 23* 22* 28* 22*  CREATININE 0.75 0.78 0.78 0.90 0.65  CALCIUM 8.0* 8.4* 8.3* 8.2* 8.2*  MG 2.1 2.0 2.0  --   --   PHOS 2.9 3.2 3.7  --   --   GLUCOSE 136* 110* 134* 139* 105*   Protein Profile:  Recent Labs Lab 09/11/15 0416 09/12/15 0440 09/14/15 2047  ALBUMIN 1.9* 1.9* 2.5*    Gastrointestinal Profile: Last BM: unknown   Nutrition-Focused Physical Exam Findings: Nutrition-Focused physical exam completed. Findings are  mild fat depletion notably of tricep region, mild-moderate muscle depletion, and no edema.     Weight Change: Pt reports weight of 160lbs prior to surgery on 1/27. RD noted in chart review the same. (5% weight loss in 1.5 weeks)   Skin:   (stage II pressure ulcer sacral)  Height:   Ht Readings from Last 1 Encounters:  09/14/15 '5\' 8"'$  (1.727 m)    Weight:   Wt Readings from Last 1 Encounters:  09/14/15 152 lb 4.8 oz (69.083 kg)   Wt Readings from Last 10 Encounters:  09/14/15 152 lb 4.8 oz (69.083 kg)  09/05/15 160 lb (72.576 kg)  09/02/15 160 lb 3.2 oz (72.666 kg)  08/28/15 161 lb 6 oz (73.2 kg)  08/25/15 162 lb 4.1 oz (73.6 kg)  08/07/15 164 lb 10.9 oz (74.7 kg)  05/29/15 163 lb 7.5 oz (74.15 kg)  05/22/15 164 lb 0.4 oz (74.4 kg)   05/13/15 164 lb 3.9 oz (74.5 kg)  01/07/15 158 lb (71.668 kg)    BMI:  Body mass index is 23.16 kg/(m^2).  Estimated Nutritional Needs:   Kcal:  BEE: 1329kcals, TEE: (IF 1.1-1.3)(AF 1.2) 1820-2150kcals  Protein:  76-90g protein (1.1-1.3g/kg)  Fluid:  1725-2032m of fluid (25-360mkg)  EDUCATION NEEDS:   No education needs identified at this time   HIBig Bear CityRD, LDN Pager ([phone]/On-Call Pager ([phone]

## 2015-09-15 NOTE — Consult Note (Signed)
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 Had recurrence with metastasis to RUL and underwent resection and XRT in 2015. More recent recurrence to brain (L hemisphere), S/P resection 09/05/15, final path still pending.   HPI:  Admitted from SNF with AMS and cough. Underwent CT head and chest. Chest CT revealed 1. No new airspace opacification seen to suggest pneumonia. 2. Previously noted metastatic nodule within the right upper lobe on prior PET/CT and recent CT is relatively stable in size, measuring approximately 1.9 cm. Surrounding postradiation change is stable in appearance.   He was admitted with a diagnosis of sepsis and PNA  He is unable to provide history to me and appears to be aphasic  Past Medical History  Diagnosis Date  . Sarcoma ([place]) 12/31/2014  . Hypertension   . Coronary artery disease   . Peripheral vascular disease ([place])   . GERD (gastroesophageal reflux disease)   . Gout   . Aortic stenosis   . Carotid stenosis   . Pathologic myopia, both eyes   . Posterior staphyloma   . 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 Malignant neoplasm of skin   . Memory changes   . Enlarged prostate   . Arthritis   . Complication of anesthesia   . PONV (postoperative nausea and vomiting)   . Aortic stenosis     moderate on 12/2013 echo    Past Surgical History  Procedure Laterality Date  . Colonoscopy N/A 01/07/2015    Procedure: COLONOSCOPY;  Surgeon: Lollie Sails, MD;  Location: [facility] ENDOSCOPY;  Service: Endoscopy;  Laterality: N/A;  . Coronary angioplasty      stents - 2 different times  . Eye surgery Bilateral     cataract surgery with lens implant  . Lung cancer surgery      2015   . Rotator cuff repair Right   . Fracture surgery    . Tendon repair Left     elbow  . Vasectomy    . Craniotomy  Right 09/05/2015    Procedure: Stereotactic Right Craniotomy for resection of tumor with BrainLab;  Surgeon: Consuella Lose, MD;  Location: [facility] NEURO ORS;  Service: Neurosurgery;  Laterality: Right;  Stereotactic Right Craniotomy for resection of tumor with BrainLab  . Application of cranial navigation N/A 09/05/2015    Procedure: APPLICATION OF CRANIAL NAVIGATION;  Surgeon: Consuella Lose, MD;  Location: [place] NEURO ORS;  Service: Neurosurgery;  Laterality: N/A;    MEDICATIONS: I have reviewed all medications and confirmed regimen as documented  Social History   Social History  . Marital Status: Married    Spouse Name: N/A  . Number of Children: N/A  . Years of Education: N/A   Occupational History  . Not on file.   Social History Main Topics  . Smoking status: Former Smoker    Quit date: 08/08/1965  . Smokeless tobacco: Never Used  . Alcohol Use: Yes     Comment: social  . Drug Use: No  . Sexual Activity: Not on file   Other Topics Concern  . Not on file   Social History Narrative    Family History  Problem Relation Age of Onset  . Heart disease Mother   . Heart disease Father     ROS: Level 5 caveat   Filed Vitals:   09/14/15 2347 09/15/15 0517 09/15/15 0746 09/15/15 1049  BP: 129/54 145/48 123/46 133/70  Pulse: 64 51 61 68  Temp: 99.1 F (37.3 C) 98.1 F (36.7 C) 98 F (36.7 C) 98.6 F (37 C)  TempSrc: Oral Oral Oral Oral  Resp: 20  20   Height: '5\' 8"'$  (1.727 m)     Weight: 69.083 kg (152 lb 4.8 oz)     SpO2: 96% 94% 96%    Room air  EXAM:  Gen: Regards examiner but seems aphasic, No overt respiratory distress HEENT: NCAT, TMs and canals normal, sclera white, oropharynx normal Neck: Supple without LAN, thyromegaly, JVD Lungs: breath sounds full anteriorly, percussion note normal, No wheezes Cardiovascular: Normal rate, reg rhythm, no murmurs noted Abdomen: Soft, nontender, normal BS Ext: without clubbing, cyanosis, edema   DATA:   BMP Latest  Ref Rng 09/15/2015 09/14/2015 09/12/2015  Glucose 65 - 99 mg/dL 105(H) 139(H) 134(H)  BUN 6 - 20 mg/dL 22(H) 28(H) 22(H)  Creatinine 0.61 - 1.24 mg/dL 0.65 0.90 0.78  Sodium 135 - 145 mmol/L 135 134(L) 138  Potassium 3.5 - 5.1 mmol/L 3.8 3.9 4.2  Chloride 101 - 111 mmol/L 104 100(L) 101  CO2 22 - 32 mmol/L '24 24 27  '$ Calcium 8.9 - 10.3 mg/dL 8.2(L) 8.2(L) 8.3(L)    CBC Latest Ref Rng 09/15/2015 09/14/2015 09/12/2015  WBC 3.8 - 10.6 K/uL 12.2(H) 13.8(H) 14.0(H)  Hemoglobin 13.0 - 18.0 g/dL 11.6(L) 11.5(L) 11.0(L)  Hematocrit 40.0 - 52.0 % 35.4(L) 35.1(L) 31.6(L)  Platelets 150 - 440 K/uL 170 180 159    CXR:  Chronic RUL opacity  CT chest:  1. No new airspace opacification seen to suggest pneumonia. 2. Previously noted metastatic nodule within the right upper lobe on prior PET/CT and recent CT is relatively stable in size, measuring approximately 1.9 cm. Surrounding postradiation change is stable in Appearance 3. Mild fibrosis, bronchiectasis and honeycombing at the lung bases, similar in appearance to the prior study  IMPRESSION:   Metastatic sarcoma Recent L craniotomy for resection of metastatic focus Prior history of RUL metastasis, s/p surgical resection and XRT Chronic RUL radiation fibrosis Based on lack of new findings on CT chest, I doubt PNA No clear evidence of sepsis   PLAN:  Check procalcitonin - if normal, would DC antibiotics Would consider Palliative Care consultation to address goals of care   Wilhelmina Mcardle, MD [facility], Critical Care Medicine

## 2015-09-15 NOTE — Progress Notes (Signed)
Pharmacy Antibiotic Note  Ryon Layton Lindon is a 81 y.o. male admitted on 09/14/2015 with sepsis.  Pharmacy has been consulted for Zosyn/vancomycin dosing.  Plan: After discussion with Dr. Tressia Miners, will discontinue vancomycin as MRSA PCR is neg. There is a>98% predictability it is not MRSA PNA if MRSA PCR is neg.  Height: '5\' 8"'$  (172.7 cm) Weight: 152 lb 4.8 oz (69.083 kg) IBW/kg (Calculated) : 68.4  Temp (24hrs), Avg:98.9 F (37.2 C), Min:98 F (36.7 C), Max:100.8 F (38.2 C)   Recent Labs Lab 09/10/15 0450 09/10/15 0455 09/11/15 0416 09/12/15 0440 09/14/15 2045 09/14/15 2047 09/14/15 2300 09/15/15 0622  WBC  --  13.1* 14.2* 14.0*  --  13.8*  --  12.2*  CREATININE 0.75  --  0.78 0.78  --  0.90  --  0.65  LATICACIDVEN  --   --   --   --  2.0  --  1.4  --     Estimated Creatinine Clearance: 73.6 mL/min (by C-G formula based on Cr of 0.65).    No Known Allergies  Abx: Vancomycin 2/5>>2/6 Zosyn 2/5>> Cx: Blood-pending Sputum Urine-no growth td MRSA PCR neg  Thank you for allowing pharmacy to be a part of this patient's care.  Ramond Dial, Pharm.D. Clinical Pharmacist 09/15/2015 1:23 PM

## 2015-09-15 NOTE — Progress Notes (Addendum)
[facility] [place] NAME: Jorge Maynard    MR#:  [id number]  DATE OF BIRTH:  05-31-1935  SUBJECTIVE:  CHIEF COMPLAINT:   Chief Complaint  Patient presents with  . Code Sepsis  sleepy, wife at bedside. Feels better. Mild cough  REVIEW OF SYSTEMS:  Review of Systems  Constitutional: Negative for fever, weight loss, malaise/fatigue and diaphoresis.  HENT: Negative for ear discharge, ear pain, hearing loss, nosebleeds, sore throat and tinnitus.   Eyes: Negative for blurred vision and pain.  Respiratory: Positive for cough and sputum production. Negative for hemoptysis, shortness of breath and wheezing.   Cardiovascular: Negative for chest pain, palpitations, orthopnea and leg swelling.  Gastrointestinal: Negative for heartburn, nausea, vomiting, abdominal pain, diarrhea, constipation and blood in stool.  Genitourinary: Negative for dysuria, urgency and frequency.  Musculoskeletal: Negative for myalgias and back pain.  Skin: Negative for itching and rash.  Neurological: Negative for dizziness, tingling, tremors, focal weakness, seizures, weakness and headaches.  Psychiatric/Behavioral: Negative for depression. The patient is not nervous/anxious.     DRUG ALLERGIES:  No Known Allergies VITALS:  Blood pressure 133/70, pulse 68, temperature 98.6 F (37 C), temperature source Oral, resp. rate 20, height '5\' 8"'$  (1.727 m), weight 69.083 kg (152 lb 4.8 oz), SpO2 96 %. PHYSICAL EXAMINATION:  Physical Exam  Constitutional: He is oriented to person, place, and time and well-developed, well-nourished, and in no distress.  HENT:  Head: Normocephalic and atraumatic.  Staples in place from left frontal craniotomy   Eyes: Conjunctivae and EOM are normal. Pupils are equal, round, and reactive to light.  Neck: Normal range of motion. Neck supple. No tracheal deviation present. No thyromegaly present.  Cardiovascular: Normal rate, regular rhythm  and normal heart sounds.   Pulmonary/Chest: Effort normal and breath sounds normal. No respiratory distress. He has no wheezes. He exhibits no tenderness.  Abdominal: Soft. Bowel sounds are normal. He exhibits no distension. There is no tenderness.  Musculoskeletal: Normal range of motion.  Neurological: He is alert and oriented to person, place, and time. No cranial nerve deficit.  Does not speak much, responds only with short answers to simple questions. No dysarthria, no aphasia   Skin: Skin is warm and dry. No rash noted.  Psychiatric: Mood and affect normal.  Unable to assess in details as sleepy   LABORATORY PANEL:   CBC  Recent Labs Lab 09/15/15 0622  WBC 12.2*  HGB 11.6*  HCT 35.4*  PLT 170   ------------------------------------------------------------------------------------------------------------------ Chemistries   Recent Labs Lab 09/12/15 0440 09/14/15 2047 09/15/15 0622  NA 138 134* 135  K 4.2 3.9 3.8  CL 101 100* 104  CO2 '27 24 24  '$ GLUCOSE 134* 139* 105*  BUN 22* 28* 22*  CREATININE 0.78 0.90 0.65  CALCIUM 8.3* 8.2* 8.2*  MG 2.0  --   --   AST  --  29  --   ALT  --  80*  --   ALKPHOS  --  121  --   BILITOT  --  0.6  --    RADIOLOGY:  Ct Head Wo Contrast  09/14/2015  CLINICAL DATA:  Craniotomy 2 weeks ago, swelling at site, fever. History of sarcoma, hypertension, skin neoplasm. EXAM: CT HEAD WITHOUT CONTRAST TECHNIQUE: Contiguous axial images were obtained from the base of the skull through the vertex without intravenous contrast. COMPARISON:  CT head September 06, 2015 FINDINGS: Patient is status post LEFT frontal craniotomy for known metastatic lesion.  Decreased mass effect with partial re-expansion of LEFT frontal horn of the lateral ventricle. Fluid-filled resection cavity with residual intraparenchymal pneumocephalus. Local mass effect without midline shift. Focal low density in the LEFT basal ganglia corresponding to edema on prior study. Mild chronic  small vessel ischemic disease, better characterized on prior MRI. No acute large vascular territory infarcts. LEFT frontal extra-axial fluid collection is decreased in size, previously 2.2 cm, now 1 cm, with persistent extra-axial pneumocephalus. Mild calcific atherosclerosis of the carotid siphons. Enlarging LEFT frontal scalp low-density fluid collection measuring up to 13 mm transaxial dimension with overlying skin staples. No subcutaneous gas. 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 Paranasal sinuses and mastoid air cells are well aerated. IMPRESSION: Status post recent LEFT frontal craniotomy for resection of metastatic lesion with expected postoperative changes, decreasing extra-axial fluid collection. Increased LEFT frontal scalp fluid collection, though this may represent pseudomeningocele, abscess could have a similar appearance. Consider sampling. Electronically Signed   By: Elon Alas M.D.   On: 09/14/2015 21:48   Ct Chest W Contrast  09/14/2015  CLINICAL DATA:  Acute onset of confusion and cough. CT of the chest suggested following chest radiograph. Initial encounter. EXAM: CT CHEST WITH CONTRAST TECHNIQUE: Multidetector CT imaging of the chest was performed during intravenous contrast administration. CONTRAST:  71m OMNIPAQUE IOHEXOL 300 MG/ML  SOLN COMPARISON:  Chest radiograph performed earlier today at 8:48 p.m., and CT of the chest performed 08/27/2015 FINDINGS: The previously noted metastatic nodule within the right upper lobe on prior PET/CT and recent CT is again noted, relatively stable in size, measuring approximately 1.9 cm. Surrounding airspace opacification reflects postradiation change, stable from the prior study. Mild fibrosis, bronchiectasis and honeycombing are noted at the lung bases, similar in appearance to the prior study. No significant pleural effusion or pneumothorax is seen. No new airspace opacification is seen to suggest  pneumonia. The heart is mildly enlarged. Calcification is noted at the aortic valve. Diffuse coronary artery calcifications are seen. Visualized mediastinal nodes remain grossly normal in size. Scattered calcification is noted along the aortic arch and proximal great vessels. No pericardial effusion is seen. The thyroid gland is unremarkable. No axillary lymphadenopathy is appreciated. The liver and spleen are unremarkable in appearance. The gallbladder is mildly distended but otherwise unremarkable. The visualized portions of the pancreas, adrenal glands and kidneys are grossly unremarkable. Nonspecific perinephric stranding is noted bilaterally. No acute osseous abnormalities are identified. There is grade 1 anterolisthesis of C7 on T1, and of T2 on T3, with degenerative change noted along the lower cervical and upper thoracic spine. IMPRESSION: 1. No new airspace opacification seen to suggest pneumonia. 2. Previously noted metastatic nodule within the right upper lobe on prior PET/CT and recent CT is relatively stable in size, measuring approximately 1.9 cm. Surrounding postradiation change is stable in appearance. 3. Mild fibrosis, bronchiectasis and honeycombing at the lung bases, similar in appearance to the prior study. 4. Mild cardiomegaly. Calcification at the aortic valve. Diffuse coronary artery calcifications seen. 5. Degenerative change along the lower cervical and upper thoracic spine. Electronically Signed   By: JGarald BaldingM.D.   On: 09/14/2015 23:17   Dg Chest Port 1 View  09/14/2015  CLINICAL DATA:  Patient with recent left craniotomy area swelling and fever. He is alert, but does not answer RN questions appropriately and cannot follow breathing instructions .Hx sarcoma 2016, cad, htn, PVD, GERD, aortic/carotid stenosis. EXAM: PORTABLE CHEST 1 VIEW COMPARISON:  09/06/2015, 08/27/2015 FINDINGS: Heart is  enlarged. There is elevation of the right hemidiaphragm. Patchy density is noted in the left  upper lobe, similar in appearance to prior studies. There is increased opacity in the right upper lobe with possible air-fluid level indicating a cavitary space. IMPRESSION: 1. Increased opacity in the right upper lobe with possible cavitary lesion in an area of suspected metastatic lesion. Consider PA and lateral views and/or CT of the chest. 2. Persistent patchy density in the left upper lobe, stable. 3. Stable cardiomegaly. Electronically Signed   By: Nolon Nations M.D.   On: 09/14/2015 21:30   ASSESSMENT AND PLAN:  * Sepsis - Ruled out. lactic acid normal, hemodynamically stable, IV antibiotics started in the ED, blood and urine cultures neg thus far, sputum culture pending. Continue empiric antibiotics and follow cultures. Gentle hydration.  * HCAP (healthcare-associated pneumonia) - ruled out. Continue empiric antibiotics and cultures as above. Will get ST eval as some concern for aspiration here.  * Essential (primary) hypertension - stable, continue home meds   * Peripheral vascular disease ([place]) - continue home meds   * S/P craniotomy - seems to be healing well without signs or symptoms of infection at this time. Continue home meds including Decadron.  * Coronary artery disease involving native coronary artery of native heart without angina pectoris - continue home meds  * GERD (gastroesophageal reflux disease) - home dose PPI    All the records are reviewed and case discussed with Care Management/Social Worker. Management plans discussed with the patient, family and they are in agreement.  CODE STATUS: FULL CODE  TOTAL TIME TAKING CARE OF THIS PATIENT: 35 minutes.   More than 50% of the time was spent in counseling/coordination of care: YES (WIFE AT BEDSIDE)  POSSIBLE D/C IN 1-2 DAYS, DEPENDING ON CLINICAL CONDITION. If remains afebrile and improving leukocytosis.   [facility], Shabre Kreher M.D on 09/15/2015 at 11:09 AM  Between 7am to 6pm - Pager - [phone]  After 6pm go to  [email or web address] - password [email or web address] [facility]  Office  [phone]  CC: Primary care physician; Rusty Aus., MD  Note: This dictation was prepared with Dragon dictation along with smaller phrase technology. Any transcriptional errors that result from this process are unintentional.

## 2015-09-16 ENCOUNTER — Inpatient Hospital Stay: Payer: Medicare PPO

## 2015-09-16 LAB — CBC
HEMATOCRIT: 34.4 % — AB (ref 40.0–52.0)
Hemoglobin: 11.8 g/dL — ABNORMAL LOW (ref 13.0–18.0)
MCH: 30.8 pg (ref 26.0–34.0)
MCHC: 34.3 g/dL (ref 32.0–36.0)
MCV: 89.8 fL (ref 80.0–100.0)
Platelets: 210 10*3/uL (ref 150–440)
RBC: 3.83 MIL/uL — ABNORMAL LOW (ref 4.40–5.90)
RDW: 15.1 % — AB (ref 11.5–14.5)
WBC: 14.7 10*3/uL — AB (ref 3.8–10.6)

## 2015-09-16 LAB — PROCALCITONIN

## 2015-09-16 LAB — BASIC METABOLIC PANEL
ANION GAP: 5 (ref 5–15)
BUN: 26 mg/dL — ABNORMAL HIGH (ref 6–20)
CALCIUM: 8.2 mg/dL — AB (ref 8.9–10.3)
CO2: 29 mmol/L (ref 22–32)
CREATININE: 0.7 mg/dL (ref 0.61–1.24)
Chloride: 98 mmol/L — ABNORMAL LOW (ref 101–111)
Glucose, Bld: 123 mg/dL — ABNORMAL HIGH (ref 65–99)
Potassium: 3.8 mmol/L (ref 3.5–5.1)
SODIUM: 132 mmol/L — AB (ref 135–145)

## 2015-09-16 LAB — URINE CULTURE: CULTURE: NO GROWTH

## 2015-09-16 NOTE — Progress Notes (Signed)
pts mental status with change noted from  Earlier.  Less alert at times.  Pt will look at you but does not verbally respond when spoken to. Earlier pt was   Saying  A word or two. Was able to feed self earlier at breakfast but  Lunch  His coordination seemed to be off. . Pertl. Pt would slightly grip  My hands  More grip on rt than left. . P.t. Saw pt and spoke with dr Manuella Ghazi  regarging decreased condition. Discharge was cancelled and ct head ordered and neuro consult ordered.dr shsh spoke with wife re this change.

## 2015-09-16 NOTE — Discharge Instructions (Signed)
Cough, Adult A cough helps to clear your throat and lungs. A cough may last only 2-3 weeks (acute), or it may last longer than 8 weeks (chronic). Many different things can cause a cough. A cough may be a sign of an illness or another medical condition. HOME CARE  Pay attention to any changes in your cough.  Take medicines only as told by your doctor.  If you were prescribed an antibiotic medicine, take it as told by your doctor. Do not stop taking it even if you start to feel better.  Talk with your doctor before you try using a cough medicine.  Drink enough fluid to keep your pee (urine) clear or pale yellow.  If the air is dry, use a cold steam vaporizer or humidifier in your home.  Stay away from things that make you cough at work or at home.  If your cough is worse at night, try using extra pillows to raise your head up higher while you sleep.  Do not smoke, and try not to be around smoke. If you need help quitting, ask your doctor.  Do not have caffeine.  Do not drink alcohol.  Rest as needed. GET HELP IF:  You have new problems (symptoms).  You cough up yellow fluid (pus).  Your cough does not get better after 2-3 weeks, or your cough gets worse.  Medicine does not help your cough and you are not sleeping well.  You have pain that gets worse or pain that is not helped with medicine.  You have a fever.  You are losing weight and you do not know why.  You have night sweats. GET HELP RIGHT AWAY IF:  You cough up blood.  You have trouble breathing.  Your heartbeat is very fast.   This information is not intended to replace advice given to you by your health care provider. Make sure you discuss any questions you have with your health care provider.   Document Released: 04/08/2011 Document Revised: 04/16/2015 Document Reviewed: 10/02/2014 Elsevier Interactive Patient Education 2016 [place] Pneumonia, Adult Pneumonia is an infection  of the lungs. One type of pneumonia can happen while a person is in a hospital. A different type can happen when a person is not in a hospital (community-acquired pneumonia). It is easy for this kind to spread from person to person. It can spread to you if you breathe near an infected person who coughs or sneezes. Some symptoms include:  A dry cough.  A wet (productive) cough.  Fever.  Sweating.  Chest pain. HOME CARE  Take over-the-counter and prescription medicines only as told by your doctor.  Only take cough medicine if you are losing sleep.  If you were prescribed an antibiotic medicine, take it as told by your doctor. Do not stop taking the antibiotic even if you start to feel better.  Sleep with your head and neck raised (elevated). You can do this by putting a few pillows under your head, or you can sleep in a recliner.  Do not use tobacco products. These include cigarettes, chewing tobacco, and e-cigarettes. If you need help quitting, ask your doctor.  Drink enough water to keep your pee (urine) clear or pale yellow. A shot (vaccine) can help prevent pneumonia. Shots are often suggested for:  People older than 79 years of age.  People older than 79 years of age:  Who are having cancer treatment.  Who have long-term (chronic) lung disease.  Who have problems  with their body's defense system (immune system). You may also prevent pneumonia if you take these actions:  Get the flu (influenza) shot every year.  Go to the dentist as often as told.  Wash your hands often. If soap and water are not available, use hand sanitizer. GET HELP IF:  You have a fever.  You lose sleep because your cough medicine does not help. GET HELP RIGHT AWAY IF:  You are short of breath and it gets worse.  You have more chest pain.  Your sickness gets worse. This is very serious if:  You are an older adult.  Your body's defense system is weak.  You cough up blood.   This  information is not intended to replace advice given to you by your health care provider. Make sure you discuss any questions you have with your health care provider.   Document Released: 01/12/2008 Document Revised: 04/16/2015 Document Reviewed: 11/20/2014 Elsevier Interactive Patient Education [employer].

## 2015-09-16 NOTE — Care Management Important Message (Signed)
Important Message  Patient Details  Name: NAIM MURTHA MRN: [id number] Date of Birth: 1934-11-19   Medicare Important Message Given:  Yes    Beau Fanny, RN 09/16/2015, 9:58 AM

## 2015-09-16 NOTE — Progress Notes (Signed)
[facility] [place] NAME: Jorge Maynard    MR#:  [id number]  DATE OF BIRTH:  [birth year]  SUBJECTIVE:  CHIEF COMPLAINT:   Chief Complaint  Patient presents with  . Code Sepsis  was planned for D/C but much more lethargic and weaker (also per PT assessment) REVIEW OF SYSTEMS:  Review of Systems  Constitutional: Negative for fever, weight loss, malaise/fatigue and diaphoresis.  HENT: Negative for ear discharge, ear pain, hearing loss, nosebleeds, sore throat and tinnitus.   Eyes: Negative for blurred vision and pain.  Respiratory: Positive for cough and sputum production. Negative for hemoptysis, shortness of breath and wheezing.   Cardiovascular: Negative for chest pain, palpitations, orthopnea and leg swelling.  Gastrointestinal: Negative for heartburn, nausea, vomiting, abdominal pain, diarrhea, constipation and blood in stool.  Genitourinary: Negative for dysuria, urgency and frequency.  Musculoskeletal: Negative for myalgias and back pain.  Skin: Negative for itching and rash.  Neurological: Negative for dizziness, tingling, tremors, focal weakness, seizures, weakness and headaches.  Psychiatric/Behavioral: Negative for depression. The patient is not nervous/anxious.     DRUG ALLERGIES:  No Known Allergies VITALS:  Blood pressure 118/52, pulse 72, temperature 98 F (36.7 C), temperature source Oral, resp. rate 20, height '5\' 8"'$  (1.727 m), weight 69.083 kg (152 lb 4.8 oz), SpO2 96 %. PHYSICAL EXAMINATION:  Physical Exam  Constitutional: He is oriented to person, place, and time and well-developed, well-nourished, and in no distress.  HENT:  Head: Normocephalic and atraumatic.  Staples in place from left frontal craniotomy   Eyes: Conjunctivae and EOM are normal. Pupils are equal, round, and reactive to light.  Neck: Normal range of motion. Neck supple. No tracheal deviation present. No thyromegaly present.  Cardiovascular:  Normal rate, regular rhythm and normal heart sounds.   Pulmonary/Chest: Effort normal and breath sounds normal. No respiratory distress. He has no wheezes. He exhibits no tenderness.  Abdominal: Soft. Bowel sounds are normal. He exhibits no distension. There is no tenderness.  Musculoskeletal: Normal range of motion.  Neurological: He is alert and oriented to person, place, and time. No cranial nerve deficit.  Does not speak much, responds only with short answers to simple questions. No dysarthria, no aphasia   Skin: Skin is warm and dry. No rash noted.  Psychiatric: Mood and affect normal.  Unable to assess in details as sleepy   LABORATORY PANEL:   CBC  Recent Labs Lab 09/16/15 0504  WBC 14.7*  HGB 11.8*  HCT 34.4*  PLT 210   ------------------------------------------------------------------------------------------------------------------ Chemistries   Recent Labs Lab 09/12/15 0440 09/14/15 2047  09/16/15 0504  NA 138 134*  < > 132*  K 4.2 3.9  < > 3.8  CL 101 100*  < > 98*  CO2 27 24  < > 29  GLUCOSE 134* 139*  < > 123*  BUN 22* 28*  < > 26*  CREATININE 0.78 0.90  < > 0.70  CALCIUM 8.3* 8.2*  < > 8.2*  MG 2.0  --   --   --   AST  --  29  --   --   ALT  --  80*  --   --   ALKPHOS  --  121  --   --   BILITOT  --  0.6  --   --   < > = values in this interval not displayed. RADIOLOGY:  No results found. ASSESSMENT AND PLAN:  * worsening weakness: and lethargy, worrisome  considering recent craniotomy. Will get STAT CT head. Neuro c/s (d/w Dr Vernon Prey)  * Sepsis - Ruled out. lactic acid normal, hemodynamically stable, STOP ABX  * HCAP (healthcare-associated pneumonia) - ruled out. Stop Abx.  * Essential (primary) hypertension - stable, continue home meds   * Peripheral vascular disease ([place]) - continue home meds   * S/P craniotomy - Incision seem to be healing well without signs or symptoms of infection at this time. Continue home meds including  Decadron.  * Coronary artery disease involving native coronary artery of native heart without angina pectoris - continue home meds  * GERD (gastroesophageal reflux disease) - home dose PPI    All the records are reviewed and case discussed with Care Management/Social Worker. Management plans discussed with the patient, family and they are in agreement.  CODE STATUS: FULL CODE  TOTAL TIME TAKING CARE OF THIS PATIENT: 35 minutes.   More than 50% of the time was spent in counseling/coordination of care: YES (WIFE AT BEDSIDE)  POSSIBLE D/C IN 1-2 DAYS, DEPENDING ON CLINICAL CONDITION. Depending on Neuro eval   [facility], Dae Highley M.D on 09/16/2015 at 4:17 PM  Between 7am to 6pm - Pager - [phone]  After 6pm go to [email or web address] - password [email or web address] [facility]  Office  [phone]  CC: Primary care physician; Rusty Aus., MD  Note: This dictation was prepared with Dragon dictation along with smaller phrase technology. Any transcriptional errors that result from this process are unintentional.

## 2015-09-16 NOTE — Plan of Care (Signed)
Problem: Education: Goal: Knowledge of [facility] Education information/materials will improve Outcome: Not Progressing Education to wife  Problem: Safety: Goal: Ability to remain free from injury will improve Outcome: Progressing No injury

## 2015-09-16 NOTE — Discharge Summary (Signed)
[facility] [place] NAME: Jorge Maynard    MR#:  [id number]  DATE OF BIRTH:  02-17-1935  DATE OF ADMISSION:  09/14/2015 ADMITTING PHYSICIAN: Lance Coon, MD  DATE OF DISCHARGE: No discharge date for patient encounter.  PRIMARY CARE PHYSICIAN: Rusty Aus., MD    ADMISSION DIAGNOSIS:  HCAP (healthcare-associated pneumonia) [J18.9] Abscess of right lung with pneumonia, unspecified part of lung ([place]) [J85.1]  DISCHARGE DIAGNOSIS:  Principal Problem:   Sepsis ([place]) Active Problems:   Essential (primary) hypertension   Peripheral vascular disease ([place])   S/P craniotomy   Coronary artery disease involving native coronary artery of native heart without angina pectoris   HCAP (healthcare-associated pneumonia)   GERD (gastroesophageal reflux disease)   Pressure ulcer   Malnutrition of moderate degree Possible aspiration event Sepsis - ruled out. Pneumonia ruled out.  SECONDARY DIAGNOSIS:   Past Medical History  Diagnosis Date  . 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 Hypertension   . Coronary artery disease   . Peripheral vascular disease ([place])   . GERD (gastroesophageal reflux disease)   . Gout   . Aortic stenosis   . Carotid stenosis   . Pathologic myopia, both eyes   . Posterior staphyloma   . 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 Malignant neoplasm of skin   . Memory changes   . Enlarged prostate   . Arthritis   . Complication of anesthesia   . PONV (postoperative nausea and vomiting)   . Aortic stenosis     moderate on 12/2013 echo    HOSPITAL COURSE:  81 y.o. male who presents from nursing facility with confusion and productive cough. Patient was recently admitted and [facility] and underwent neurosurgical intervention for a frontal lobe tumor, which was resected via craniotomy without complication. Here in the ED he was found to have leukocytosis, and possible right upper lobe pneumonia on imaging. He was also  febrile and tachypneic. Hospitalists were called for admission for sepsis due to healthcare associated pneumonia. Please see Dr Tobey Grim dictated [facility] for further details.  He underwent CT chest which didn't show any changes, no pneumonia seen. He was also evaluated by Pulmo who agreed with same.  Sepsis and pneumonia were ruled out. He remained afebrile. His procalcitonin was < 0.10 indicative no bacterial infection. This was explained to patient's wife at bedside. She is in agreement with D/C plans. He was also evaluated by PT and is recommended STR/SNF where he's being D/C in stable condition.  He was also evaluated by ST who recommended Dys. 3 w/ thin liquids; monitoring during meals for follow through w/ strategies; aspiration precautions. Medication Administration: Whole meds with puree. DISCHARGE CONDITIONS:   stable  CONSULTS OBTAINED:  Treatment Team:  Wilhelmina Mcardle, MD  DRUG ALLERGIES:  No Known Allergies  DISCHARGE MEDICATIONS:   Current Discharge Medication List    CONTINUE these medications which have NOT CHANGED   Details  allopurinol (ZYLOPRIM) 300 MG tablet Take 300 mg by mouth daily.    amLODipine (NORVASC) 10 MG tablet Take 10 mg by mouth at bedtime.     aspirin 81 MG tablet Take 81 mg by mouth daily.    atorvastatin (LIPITOR) 80 MG tablet Take 80 mg by mouth at bedtime.     carvedilol (COREG) 6.25 MG tablet Take 6.25 mg by mouth 2 (two) times daily with a meal.    chlorthalidone (HYGROTON) 25 MG tablet Take 12.5 mg by  mouth 2 (two) times daily.    dexamethasone (DECADRON) 2 MG tablet Take 1 tablet (2 mg total) by mouth 2 (two) times daily. Qty: 10 tablet, Refills: 0    Esomeprazole Magnesium (NEXIUM 24HR PO) Take 22.3 mg by mouth daily.    ibuprofen (ADVIL,MOTRIN) 200 MG tablet Take 400 mg by mouth every 6 (six) hours as needed for moderate pain.    levETIRAcetam (KEPPRA) 500 MG tablet Take 1 tablet (500 mg total) by mouth 2 (two) times daily. Qty: 60  tablet, Refills: 0    pseudoephedrine-guaifenesin (MUCINEX D) 60-600 MG 12 hr tablet Take 1 tablet by mouth every 12 (twelve) hours.    ramipril (ALTACE) 10 MG capsule Take 10 mg by mouth 2 (two) times daily.    clopidogrel (PLAVIX) 75 MG tablet Take 1 tablet (75 mg total) by mouth every morning.         DISCHARGE INSTRUCTIONS:    DIET:  Regular diet  Dys. 3 w/ thin liquids; monitoring during meals for follow through w/ strategies; aspiration precautions  Medication Administration: Whole meds with puree   DISCHARGE CONDITION:  Good  ACTIVITY:  Activity as tolerated  OXYGEN:  Home Oxygen: No.   Oxygen Delivery: room air  DISCHARGE LOCATION:  nursing home   If you experience worsening of your admission symptoms, develop shortness of breath, life threatening emergency, suicidal or homicidal thoughts you must seek medical attention immediately by calling 911 or calling your MD immediately  if symptoms less severe.  You Must read complete instructions/literature along with all the possible adverse reactions/side effects for all the Medicines you take and that have been prescribed to you. Take any new Medicines after you have completely understood and accpet all the possible adverse reactions/side effects.   Please note  You were cared for by a hospitalist during your hospital stay. If you have any questions about your discharge medications or the care you received while you were in the hospital after you are discharged, you can call the unit and asked to speak with the hospitalist on call if the hospitalist that took care of you is not available. Once you are discharged, your primary care physician will handle any further medical issues. Please note that NO REFILLS for any discharge medications will be authorized once you are discharged, as it is imperative that you return to your primary care physician (or establish a relationship with a primary care physician if you do not  have one) for your aftercare needs so that they can reassess your need for medications and monitor your lab values.    On the day of Discharge:  VITAL SIGNS:  Blood pressure 102/54, pulse 66, temperature 97.7 F (36.5 C), temperature source Oral, resp. rate 18, height '5\' 8"'$  (1.727 m), weight 69.083 kg (152 lb 4.8 oz), SpO2 96 %.  PHYSICAL EXAMINATION:  GENERAL:  81 y.o.-year-old patient lying in the bed with no acute distress.  EYES: Pupils equal, round, reactive to light and accommodation. No scleral icterus. Extraocular muscles intact.  HEENT:  Head: Normocephalic and atraumatic.  Staples in place from left frontal craniotomy  Eyes: Conjunctivae and EOM are normal. Pupils are equal, round, and reactive to light.  Neck: Normal range of motion. Neck supple. No tracheal deviation present. No thyromegaly present.  Cardiovascular: Normal rate, regular rhythm and normal heart sounds.  Pulmonary/Chest: Effort normal and breath sounds normal. No respiratory distress. He has no wheezes. He exhibits no tenderness.  Abdominal: Soft. Bowel sounds are normal. He  exhibits no distension. There is no tenderness.  Musculoskeletal: Normal range of motion.  Neurological: He is alert and oriented to person, place, and time. No cranial nerve deficit.  Does not speak much, responds only with short answers to simple questions. No dysarthria, no aphasia  Skin: Skin is warm and dry. No rash noted.  Psychiatric: Mood and affect normal.  Unable to assess in details as sleepy  DATA REVIEW:   CBC  Recent Labs Lab 09/16/15 0504  WBC 14.7*  HGB 11.8*  HCT 34.4*  PLT 210    Chemistries   Recent Labs Lab 09/12/15 0440 09/14/15 2047  09/16/15 0504  NA 138 134*  < > 132*  K 4.2 3.9  < > 3.8  CL 101 100*  < > 98*  CO2 27 24  < > 29  GLUCOSE 134* 139*  < > 123*  BUN 22* 28*  < > 26*  CREATININE 0.78 0.90  < > 0.70  CALCIUM 8.3* 8.2*  < > 8.2*  MG 2.0  --   --   --   AST  --  29  --   --   ALT   --  80*  --   --   ALKPHOS  --  121  --   --   BILITOT  --  0.6  --   --   < > = values in this interval not displayed.  Cardiac Enzymes No results for input(s): TROPONINI in the last 168 hours.  Microbiology Results  Results for orders placed or performed during the hospital encounter of 09/14/15  Blood Culture (routine x 2)     Status: None (Preliminary result)   Collection Time: 09/14/15  8:45 PM  Result Value Ref Range Status   Specimen Description BLOOD RIGHT ASSIST CONTROL  Final   Special Requests   Final    BOTTLES DRAWN AEROBIC AND ANAEROBIC  6CC AERO, 5CC ANA   Culture NO GROWTH 2 DAYS  Final   Report Status PENDING  Incomplete  Urine culture     Status: None   Collection Time: 09/14/15  8:45 PM  Result Value Ref Range Status   Specimen Description URINE, RANDOM  Final   Special Requests NONE  Final   Culture NO GROWTH 1 DAY  Final   Report Status 09/16/2015 FINAL  Final  Blood Culture (routine x 2)     Status: None (Preliminary result)   Collection Time: 09/14/15  8:46 PM  Result Value Ref Range Status   Specimen Description BLOOD LEFT HAND  Final   Special Requests   Final    BOTTLES DRAWN AEROBIC AND ANAEROBIC  4CC AERO, 3CC ANA   Culture NO GROWTH 2 DAYS  Final   Report Status PENDING  Incomplete  Culture, sputum-assessment     Status: None   Collection Time: 09/15/15  9:48 AM  Result Value Ref Range Status   Specimen Description EXPECTORATED SPUTUM  Final   Special Requests NONE  Final   Sputum evaluation THIS SPECIMEN IS ACCEPTABLE FOR SPUTUM CULTURE  Final   Report Status 09/15/2015 FINAL  Final  Culture, respiratory (NON-Expectorated)     Status: None (Preliminary result)   Collection Time: 09/15/15  9:48 AM  Result Value Ref Range Status   Specimen Description EXPECTORATED SPUTUM  Final   Special Requests NONE Reflexed from [id number]  Final   Gram Stain PENDING  Incomplete   Culture   Final    LIGHT GROWTH GRAM NEGATIVE RODS  IDENTIFICATION AND  SUSCEPTIBILITIES TO FOLLOW    Report Status PENDING  Incomplete    RADIOLOGY:  Ct Head Wo Contrast  09/14/2015  CLINICAL DATA:  Craniotomy 2 weeks ago, swelling at site, fever. History of sarcoma, hypertension, skin neoplasm. EXAM: CT HEAD WITHOUT CONTRAST TECHNIQUE: Contiguous axial images were obtained from the base of the skull through the vertex without intravenous contrast. COMPARISON:  CT head September 06, 2015 FINDINGS: Patient is status post LEFT frontal craniotomy for known metastatic lesion. Decreased mass effect with partial re-expansion of LEFT frontal horn of the lateral ventricle. Fluid-filled resection cavity with residual intraparenchymal pneumocephalus. Local mass effect without midline shift. Focal low density in the LEFT basal ganglia corresponding to edema on prior study. Mild chronic small vessel ischemic disease, better characterized on prior MRI. No acute large vascular territory infarcts. LEFT frontal extra-axial fluid collection is decreased in size, previously 2.2 cm, now 1 cm, with persistent extra-axial pneumocephalus. Mild calcific atherosclerosis of the carotid siphons. Enlarging LEFT frontal scalp low-density fluid collection measuring up to 13 mm transaxial dimension with overlying skin staples. No subcutaneous gas. 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 Paranasal sinuses and mastoid air cells are well aerated. IMPRESSION: Status post recent LEFT frontal craniotomy for resection of metastatic lesion with expected postoperative changes, decreasing extra-axial fluid collection. Increased LEFT frontal scalp fluid collection, though this may represent pseudomeningocele, abscess could have a similar appearance. Consider sampling. Electronically Signed   By: Elon Alas M.D.   On: 09/14/2015 21:48   Ct Chest W Contrast  09/14/2015  CLINICAL DATA:  Acute onset of confusion and cough. CT of the chest suggested following chest radiograph.  Initial encounter. EXAM: CT CHEST WITH CONTRAST TECHNIQUE: Multidetector CT imaging of the chest was performed during intravenous contrast administration. CONTRAST:  77m OMNIPAQUE IOHEXOL 300 MG/ML  SOLN COMPARISON:  Chest radiograph performed earlier today at 8:48 p.m., and CT of the chest performed 08/27/2015 FINDINGS: The previously noted metastatic nodule within the right upper lobe on prior PET/CT and recent CT is again noted, relatively stable in size, measuring approximately 1.9 cm. Surrounding airspace opacification reflects postradiation change, stable from the prior study. Mild fibrosis, bronchiectasis and honeycombing are noted at the lung bases, similar in appearance to the prior study. No significant pleural effusion or pneumothorax is seen. No new airspace opacification is seen to suggest pneumonia. The heart is mildly enlarged. Calcification is noted at the aortic valve. Diffuse coronary artery calcifications are seen. Visualized mediastinal nodes remain grossly normal in size. Scattered calcification is noted along the aortic arch and proximal great vessels. No pericardial effusion is seen. The thyroid gland is unremarkable. No axillary lymphadenopathy is appreciated. The liver and spleen are unremarkable in appearance. The gallbladder is mildly distended but otherwise unremarkable. The visualized portions of the pancreas, adrenal glands and kidneys are grossly unremarkable. Nonspecific perinephric stranding is noted bilaterally. No acute osseous abnormalities are identified. There is grade 1 anterolisthesis of C7 on T1, and of T2 on T3, with degenerative change noted along the lower cervical and upper thoracic spine. IMPRESSION: 1. No new airspace opacification seen to suggest pneumonia. 2. Previously noted metastatic nodule within the right upper lobe on prior PET/CT and recent CT is relatively stable in size, measuring approximately 1.9 cm. Surrounding postradiation change is stable in  appearance. 3. Mild fibrosis, bronchiectasis and honeycombing at the lung bases, similar in appearance to the prior study. 4. Mild cardiomegaly. Calcification at the aortic valve. Diffuse coronary artery  calcifications seen. 5. Degenerative change along the lower cervical and upper thoracic spine. Electronically Signed   By: Garald Balding M.D.   On: 09/14/2015 23:17   Dg Chest Port 1 View  09/14/2015  CLINICAL DATA:  Patient with recent left craniotomy area swelling and fever. He is alert, but does not answer RN questions appropriately and cannot follow breathing instructions .Hx sarcoma 2016, cad, htn, PVD, GERD, aortic/carotid stenosis. EXAM: PORTABLE CHEST 1 VIEW COMPARISON:  09/06/2015, 08/27/2015 FINDINGS: Heart is enlarged. There is elevation of the right hemidiaphragm. Patchy density is noted in the left upper lobe, similar in appearance to prior studies. There is increased opacity in the right upper lobe with possible air-fluid level indicating a cavitary space. IMPRESSION: 1. Increased opacity in the right upper lobe with possible cavitary lesion in an area of suspected metastatic lesion. Consider PA and lateral views and/or CT of the chest. 2. Persistent patchy density in the left upper lobe, stable. 3. Stable cardiomegaly. Electronically Signed   By: Nolon Nations M.D.   On: 09/14/2015 21:30     Management plans discussed with the patient, family and they are in agreement.  CODE STATUS:     Code Status Orders        Start     Ordered   09/14/15 2347  Full code   Continuous     09/14/15 2347    Code Status History    Date Active Date Inactive Code Status Order ID Comments User Context   09/05/2015  8:54 PM 09/12/2015  7:55 PM Full Code [id number]  Consuella Lose, MD Inpatient    Advance Directive Documentation        Most Recent Value   Type of Advance Directive  Healthcare Power of Attorney   Pre-existing out of facility DNR order (yellow form or pink MOST form)     "MOST"  Form in Place?        TOTAL TIME TAKING CARE OF THIS PATIENT: 55 minutes.    [facility], Meldrick Buttery M.D on 09/16/2015 at 10:46 AM  Between 7am to 6pm - Pager - [phone]  After 6pm go to [email or web address] - password [email or web address] [place] [facility]  Office  [phone]  CC: Primary care physician; Rusty Aus., MD   Note: This dictation was prepared with Dragon dictation along with smaller phrase technology. Any transcriptional errors that result from this process are unintentional.

## 2015-09-16 NOTE — Progress Notes (Signed)
Physical Therapy Treatment Patient Details Name: Jorge Maynard MRN: [id number] DOB: Dec 05, 1934 Today's Date: 09/16/2015    History of Present Illness Patient is a 81 y/o male that recently underwent L frontotemporal craniotomy at [facility] for tumor resection. He has previously undergone stereotatic radiation for lung mets. He developed some confusion and non-productive cough while at SNF.     PT Comments    Patient with significant change in functional status (compared to post-op notes); currently dep assist for all functional activities.  Absent visual tracking, spontaneous/purposeful movement, command following this date. Total assist for unsupported sitting balance with extreme posterior trunk lean; unsafe to attempt further mobility/OOB at this time. May benefit from neuro consult to address significant change in neuro/functional status; discussed with primary RN, MD Carlynn Spry), social work and care management.  MD to discontinue current DC order and order STAT head CT at this time.  Will continue to follow as medically appropriate.   Follow Up Recommendations  SNF     Equipment Recommendations       Recommendations for Other Services       Precautions / Restrictions Precautions Precautions: Fall Restrictions Weight Bearing Restrictions: No    Mobility  Bed Mobility Overal bed mobility: Needs Assistance;+2 for physical assistance Bed Mobility: Supine to Sit;Sit to Supine     Supine to sit: Total assist Sit to supine: Total assist;+2 for physical assistance      Transfers                 General transfer comment: unsafe to attempt transfers of OOB activity at this time  Ambulation/Gait             General Gait Details: unsafe to attempt transfers of OOB activity at this time   Stairs            Wheelchair Mobility    Modified Rankin (Stroke Patients Only)       Balance Overall balance assessment: Needs assistance Sitting-balance  support: No upper extremity supported;Feet supported Sitting balance-Leahy Scale: Zero Sitting balance - Comments: total assist for unsupported sitting balance; extreme posterior weight shift with R lateral lean noted.  Absent balance reactions or awareness of impaired midline/balance. Postural control: Posterior lean;Right lateral lean                          Cognition Arousal/Alertness: Awake/alert Behavior During Therapy: Flat affect Overall Cognitive Status: Impaired/Different from baseline                 General Comments: Patient minimally responsive to therapist--visually localizes when therapist directly in front of patient, but does not track or visually acknowledge change in stimuli.  Absent attempts at spontaneous speech; follows 0% commands during session (unable to facilitate even with hand-over-hand), demonstrates absent spontaneous movement of any extremity.    Exercises Other Exercises Other Exercises: Unsupported sitting edge of bed, total assist with extensive facilitation from therapist for lumbar extension/anterior pelvic tilt, anterior weight shift and correction of R lateral lean.  Positioned patient with bilat UEs on elevated hospital table anterior to patient to further promote postural extension and anterior weight shift--improved to mod assist, but unable to intiaite/sustain without constant assist from therapist.  No attempts at visually tracking wife, attempting to functionally utilize bilat UEs; difficulty managing oral secretions at times when upright.    General Comments        Pertinent Vitals/Pain Pain Assessment: Faces Faces Pain Scale: No  hurt    Home Living                      Prior Function            PT Goals (current goals can now be found in the care plan section) Acute Rehab PT Goals PT Goal Formulation: Patient unable to participate in goal setting Time For Goal Achievement: 09/29/15 Potential to Achieve Goals:  Fair Progress towards PT goals: Not progressing toward goals - comment (spoke with RN, MD, social work, care management regarding recommendation for neuro consult)    Frequency  Min 2X/week    PT Plan Current plan remains appropriate    Co-evaluation             End of Session     Patient left: in bed;with call bell/phone within reach;with bed alarm set     Time: 9381-8299 PT Time Calculation (min) (ACUTE ONLY): 20 min  Charges:  $Neuromuscular Re-education: 8-22 mins                    G Codes:      Jorge Maynard, PT, DPT, NCS 09/16/2015, 5:22 PM [phone]

## 2015-09-17 ENCOUNTER — Inpatient Hospital Stay: Payer: Medicare PPO

## 2015-09-17 DIAGNOSIS — I6529 Occlusion and stenosis of unspecified carotid artery: Secondary | ICD-10-CM

## 2015-09-17 DIAGNOSIS — I69851 Hemiplegia and hemiparesis following other cerebrovascular disease affecting right dominant side: Secondary | ICD-10-CM

## 2015-09-17 DIAGNOSIS — I739 Peripheral vascular disease, unspecified: Secondary | ICD-10-CM

## 2015-09-17 DIAGNOSIS — Z87891 Personal history of nicotine dependence: Secondary | ICD-10-CM

## 2015-09-17 DIAGNOSIS — R9089 Other abnormal findings on diagnostic imaging of central nervous system: Secondary | ICD-10-CM

## 2015-09-17 DIAGNOSIS — M109 Gout, unspecified: Secondary | ICD-10-CM

## 2015-09-17 DIAGNOSIS — K219 Gastro-esophageal reflux disease without esophagitis: Secondary | ICD-10-CM

## 2015-09-17 DIAGNOSIS — I350 Nonrheumatic aortic (valve) stenosis: Secondary | ICD-10-CM

## 2015-09-17 DIAGNOSIS — C499 Malignant neoplasm of connective and soft tissue, unspecified: Secondary | ICD-10-CM

## 2015-09-17 DIAGNOSIS — R05 Cough: Secondary | ICD-10-CM

## 2015-09-17 DIAGNOSIS — I638 Other cerebral infarction: Secondary | ICD-10-CM

## 2015-09-17 DIAGNOSIS — R269 Unspecified abnormalities of gait and mobility: Secondary | ICD-10-CM

## 2015-09-17 DIAGNOSIS — Z85828 Personal history of other malignant neoplasm of skin: Secondary | ICD-10-CM

## 2015-09-17 DIAGNOSIS — C7801 Secondary malignant neoplasm of right lung: Principal | ICD-10-CM

## 2015-09-17 DIAGNOSIS — Z515 Encounter for palliative care: Secondary | ICD-10-CM

## 2015-09-17 DIAGNOSIS — Z923 Personal history of irradiation: Secondary | ICD-10-CM

## 2015-09-17 DIAGNOSIS — Z66 Do not resuscitate: Secondary | ICD-10-CM

## 2015-09-17 LAB — BASIC METABOLIC PANEL
ANION GAP: 9 (ref 5–15)
BUN: 30 mg/dL — ABNORMAL HIGH (ref 6–20)
CALCIUM: 8.2 mg/dL — AB (ref 8.9–10.3)
CO2: 25 mmol/L (ref 22–32)
CREATININE: 0.72 mg/dL (ref 0.61–1.24)
Chloride: 97 mmol/L — ABNORMAL LOW (ref 101–111)
GFR calc Af Amer: >60 mL/min (ref >60)
GLUCOSE: 135 mg/dL — AB (ref 65–99)
Potassium: 3.7 mmol/L (ref 3.5–5.1)
Sodium: 131 mmol/L — ABNORMAL LOW (ref 135–145)

## 2015-09-17 LAB — CULTURE, RESPIRATORY

## 2015-09-17 LAB — CBC
HCT: 34.6 % — ABNORMAL LOW (ref 40.0–52.0)
Hemoglobin: 11.7 g/dL — ABNORMAL LOW (ref 13.0–18.0)
MCH: 30.4 pg (ref 26.0–34.0)
MCHC: 33.8 g/dL (ref 32.0–36.0)
MCV: 89.9 fL (ref 80.0–100.0)
PLATELETS: 218 10*3/uL (ref 150–440)
RBC: 3.85 MIL/uL — ABNORMAL LOW (ref 4.40–5.90)
RDW: 15.4 % — AB (ref 11.5–14.5)
WBC: 13.7 10*3/uL — AB (ref 3.8–10.6)

## 2015-09-17 LAB — CULTURE, RESPIRATORY W GRAM STAIN

## 2015-09-17 MED ORDER — GADOBENATE DIMEGLUMINE 529 MG/ML IV SOLN
15.0000 mL | Freq: Once | INTRAVENOUS | Status: AC | PRN
  Administered 2015-09-17: 11:00:00 14 mL via INTRAVENOUS

## 2015-09-17 MED ORDER — DEXAMETHASONE SODIUM PHOSPHATE 10 MG/ML IJ SOLN
4.0000 mg | Freq: Once | INTRAMUSCULAR | Status: AC
  Administered 2015-09-17: 14:00:00 4 mg via INTRAVENOUS
  Filled 2015-09-17: qty 1

## 2015-09-17 MED ORDER — SODIUM CHLORIDE 0.9 % IV SOLN
INTRAVENOUS | Status: AC
  Administered 2015-09-17 (×2): via INTRAVENOUS

## 2015-09-17 MED ORDER — DEXAMETHASONE 4 MG PO TABS
4.0000 mg | ORAL_TABLET | Freq: Three times a day (TID) | ORAL | Status: DC
  Administered 2015-09-17 – 2015-09-22 (×15): 4 mg via ORAL
  Filled 2015-09-17 (×15): qty 1

## 2015-09-17 NOTE — Progress Notes (Signed)
43 Staples removed from craniotomy site Left medial/lateral skull per order and policy.  Patient tolerated well.  Incision site approximated. Nurse will continue to monitor.

## 2015-09-17 NOTE — Consult Note (Signed)
Palliative Medicine Inpatient Consult Note   Name: Jorge Maynard Date: 09/17/2015 MRN: [id number]  DOB: [birth year]  Referring Physician: Max Sane, MD  Palliative Care consult requested for this 81 y.o. male for goals of medical therapy in patient with metastatic sarcoma.    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    After wedge resection he was followed by Dr. Oliva Bustard, oncologist, and he was found in Oct 2016 to have an enlarging right upper lobe mass that was separate from the original tumor and PET positive.  He underwent Radiation Therapy for this right lung metastatic lesion.  He began to have significant exertional dyspnea in January.  He developed slurred speech and headaches and was admitted on Jan 27 th to  [facility] for craniotomy and resection of a large left frontal tumor.  He had developed mild right hemiparesis.  He then went to [facility] for rehab.    Three days later, on Feb 5th, he was admitted here due to an increased cough. It was thought he might have a pneumonia but a CT scan showed no infiltrates.  Procalcitonin was low and the plan was to send him back to the SNF for continued rehab.   But, he had an MRI of his brain today which showed findings that are changing that plan. The MRI showed the following: 1. Scattered small acute infarcts in the right ACA, right ACA/ MCA watershed territory. No associated hemorrhage or mass effect. 2. Progressed broad-based right greater than left subdural hematomas since the postoperative scan. Increased rightward midline shift of 4-5  mm. 3. Regional left anterior frontal lobe postoperative complex extra-axial collection has not significantly changed but may communicate with the scalp soft tissues via craniotomy. Stable trace subarachnoid hemorrhage. Regressed trace intraventricular hemorrhage. No ventriculomegaly. 4. Development of rim enhancement along the resection cavity, but favor related to postoperative ischemia.  TODAY'S CONVERSATIONS AND DECISIONS: 1. Pt is DNR.  2.  Pt is not able to make his own healthcare decisions.  I called his wife. She was out in the parking lot --unable to recall where she parked the car. She came back in and we met and talked.  3.  I talked with her about pts history and prognosis --which is apparently grim. He is not really a candidate for further interventions. His functional status is very poor. He said only two things today, "My ass hurts" and "I love you" --both spoken to his wife. He does not interact when examined --does not even focus.  She understands he cannot go back and 'get more therapy' and that it is time to focus on comfort for him.  4.  We talked about various options for settings where comfort type or Hospice care can be delivered.  I think that pts prognosis for how long he will live will depend to some extent on how much he is eating.  He is on steroids (which were increased, but this has not helped him to have an appetite.  He is shown to have only taken in about 240 ml of fluid yesterday and 120 ml today --total.   I  mentioned end of life care to his wife and she was obviously quite distraught.  We talked about Hospice Home as she is a Psychologist, occupational for Hospice and wanted to discuss this option.  I let her know this is for pts who are thought to have less than 2-3 weeks to live --but that people can have terminal hospice care elsewhere also.  I let her know that based on review of pts oral intake, that he probably does have less than 2-3 weeks to live.  Will have to review this  further in the am (have ordered a calorie count).    5. I now see neurology note that mentioned ordering and EEG to rule out subclinical seizure activity--this was before the MRI that she also ordered came back. I will want to touch base with Dr. Manuella Ghazi and/ or Dr Doy Mince (neurologist) and then talk again with pts wife about pts immediate prognosis.    6.  Wife is very distraught and needs a lot of support. Unfortunately, she told ALL SIX of their shared adult children that they don't need to come here 'yet'.  She is also not eating much and had trouble finding her car today. She is grieving and upset.  Support much needed.    7.  Pt has reportedly had headaches and yet he no longer can tell us this. Steroids and empiric management for brian edema is called for.    IMPRESSION Sarcoma ---likely source was right temporal lesion c/w atypical fibroxanthoma -treated with [facility] in 2011) -- with met to right upper lung in 2015 treated in 2015 with resection of right upper lung mass and then he had a new right upper lung nodule treated with radiation. Then he developed a brain met and had a craniotomy to resect this.  Now, he is worsened.   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  SOCIAL HISTORY:  reports that he quit smoking about 50 years ago. He has never used smokeless tobacco. He reports that he drinks alcohol. He reports that he does not use illicit drugs. Married. He has several adult children and his wife does as well.  Together they have 6 adult children --all scattered in different places but some as near as [place].    LEGAL DOCUMENTS:  None Old notes state he has a LIVING WILL  CODE  STATUS: Full code  PAST MEDICAL HISTORY: Past Medical History  Diagnosis Date  . Sarcoma ([place]) 12/31/2014  . Hypertension   . Coronary artery disease   . Peripheral vascular disease ([place])   . GERD (gastroesophageal reflux disease)   . Gout   . Aortic stenosis   . Carotid stenosis   . Pathologic myopia, both eyes   . Posterior staphyloma   . Secondary sarcoma of right lung ([place])   . Malignant neoplasm of skin   . Memory changes   . Enlarged prostate   . Arthritis   . Complication of anesthesia   . PONV (postoperative nausea and vomiting)   . Aortic stenosis     moderate on 12/2013 echo    PAST SURGICAL HISTORY:  Past Surgical History  Procedure Laterality Date  . Colonoscopy N/A 01/07/2015  Procedure: COLONOSCOPY;  Surgeon: Lollie Sails, MD;  Location: [facility] ENDOSCOPY;  Service: Endoscopy;  Laterality: N/A;  . Coronary angioplasty      stents - 2 different times  . Eye surgery Bilateral     cataract surgery with lens implant  . Lung cancer surgery      2015   . Rotator cuff repair Right   . Fracture surgery    . Tendon repair Left     elbow  . Vasectomy    . Craniotomy Right 09/05/2015    Procedure: Stereotactic Right Craniotomy for resection of tumor with BrainLab;  Surgeon: Consuella Lose, MD;  Location: [place] [facility]URO [facility]S;  Service: Neurosurgery;  Laterality: Right;  Stereotactic Right Craniotomy for resection of tumor with BrainLab  . Application of cranial navigation N/A 09/05/2015    Procedure: APPLICATION OF CRANIAL NAVIGATION;  Surgeon: Consuella Lose, MD;  Location: [place] NEURO ORS;  Service: Neurosurgery;  Laterality: N/A;    ALLERGIES:  has No Known Allergies.  MEDICATIONS:  Current Facility-Administered Medications  Medication Dose Route Frequency Provider Last Rate Last Dose  . 0.9 %  sodium chloride infusion   Intravenous Continuous Max Sane, MD 75 mL/hr at 09/17/15 0758    . acetaminophen (TYLENOL) tablet 650 mg  650 mg Oral Q6H PRN Lance Coon, MD        Or  . acetaminophen (TYLENOL) suppository 650 mg  650 mg Rectal Q6H PRN Lance Coon, MD      . allopurinol (ZYLOPRIM) tablet 300 mg  300 mg Oral Daily Lance Coon, MD   300 mg at 09/17/15 0759  . aspirin chewable tablet 81 mg  81 mg Oral Daily Lance Coon, MD   81 mg at 09/17/15 0758  . atorvastatin (LIPITOR) tablet 80 mg  80 mg Oral QHS Lance Coon, MD   80 mg at 09/16/15 2054  . dexamethasone (DECADRON) tablet 4 mg  4 mg Oral 3 times per day Alexis Goodell, MD      . docusate sodium (COLACE) capsule 100 mg  100 mg Oral BID Vaughan Basta, MD   100 mg at 09/17/15 0758  . enoxaparin (LOVENOX) injection 40 mg  40 mg Subcutaneous Q24H Gladstone Lighter, MD   40 mg at 09/17/15 1344  . feeding supplement (ENSURE ENLIVE) (ENSURE ENLIVE) liquid 237 mL  237 mL Oral BID BM Vipul Shah, MD   237 mL at 09/17/15 1000  . ipratropium-albuterol (DUONEB) 0.5-2.5 (3) MG/3ML nebulizer solution 3 mL  3 mL Nebulization Q2H PRN Lance Coon, MD      . levETIRAcetam (KEPPRA) tablet 500 mg  500 mg Oral BID Lance Coon, MD   500 mg at 09/17/15 0758  . ondansetron (ZOFRAN) tablet 4 mg  4 mg Oral Q6H PRN Lance Coon, MD       Or  . ondansetron [facility]) injection 4 mg  4 mg Intravenous Q6H PRN Lance Coon, MD      . pantoprazole (PROTONIX) EC tablet 40 mg  40 mg Oral QAC breakfast Lance Coon, MD   40 mg at 09/17/15 0758  . sodium chloride flush (NS) 0.9 % injection 3 mL  3 mL Intravenous Q12H Lance Coon, MD   3 mL at 09/17/15 1000    Vital Signs: BP 108/45 mmHg  Pulse 61  Temp(Src) 97.9 F (36.6 C) (Oral)  Resp 18  Ht _0  (1.727 m)  Wt 69.083 kg (152 lb 4.8 oz)  BMI 23.16 kg/m2  SpO2 94% Filed Weights   09/14/15 2043  09/14/15 2347  Weight: 72.3 kg (159 lb 6.3 oz) 69.083 kg (152 lb 4.8 oz)    Estimated body mass index is 23.16 kg/(m^2) as calculated from the following:   Height as of this encounter: _0  (1.727 m).   Weight as of this encounter: 69.083 kg (152 lb 4.8  oz).  PERFORMANCE STATUS (ECOG) : 4 - Bedbound  PHYSICAL EXAM: Lying in bed with eyes open He does not talk to me or focus on me NAD but looks sad No JVD or TM hrt rrr no m Lungs cta no rales Abd soft and NT Ext no cyanosis or mottling.   LABS: CBC:    Component Value Date/Time   WBC 13.7* 09/17/2015 0430   WBC 10.2 09/05/2014 1058   HGB 11.7* 09/17/2015 0430   HGB 13.7 09/05/2014 1058   HCT 34.6* 09/17/2015 0430   HCT 40.6 09/05/2014 1058   PLT 218 09/17/2015 0430   PLT 238 09/05/2014 1058   MCV 89.9 09/17/2015 0430   MCV 91 09/05/2014 1058   NEUTROABS 12.1* 09/14/2015 2047   NEUTROABS 6.5 09/05/2014 1058   LYMPHSABS 0.7* 09/14/2015 2047   LYMPHSABS 2.2 09/05/2014 1058   MONOABS 0.6 09/14/2015 2047   MONOABS 0.8 09/05/2014 1058   EOSABS 0.3 09/14/2015 2047   EOSABS 0.6 09/05/2014 1058   BASOSABS 0.1 09/14/2015 2047   BASOSABS 0.1 09/05/2014 1058   Comprehensive Metabolic Panel:    Component Value Date/Time   NA 131* 09/17/2015 0430   NA 141 09/05/2014 1058   K 3.7 09/17/2015 0430   K 3.7 09/05/2014 1058   CL 97* 09/17/2015 0430   CL 103 09/05/2014 1058   CO2 25 09/17/2015 0430   CO2 27 09/05/2014 1058   BUN 30* 09/17/2015 0430   BUN 21* 09/05/2014 1058   CREATININE 0.72 09/17/2015 0430   CREATININE 1.04 09/05/2014 1058   GLUCOSE 135* 09/17/2015 0430   GLUCOSE 87 09/05/2014 1058   CALCIUM 8.2* 09/17/2015 0430   CALCIUM 8.5 09/05/2014 1058   AST 29 09/14/2015 2047   AST 18 09/05/2014 1058   ALT 80* 09/14/2015 2047   ALT 29 09/05/2014 1058   ALKPHOS 121 09/14/2015 2047   ALKPHOS 114 09/05/2014 1058   BILITOT 0.6 09/14/2015 2047   BILITOT 0.6 09/05/2014 1058   PROT 5.7* 09/14/2015 2047   PROT 7.0 09/05/2014 1058   ALBUMIN 2.5* 09/14/2015 2047   ALBUMIN 3.5 09/05/2014 1058     More than 50% of the visit was spent in counseling/coordination of care: Yes  Time Spent: 80 minutes

## 2015-09-17 NOTE — Clinical Social Work Note (Signed)
Clinical Social Work Assessment  Patient Details  Name: Jorge Maynard MRN: [id number] Date of Birth: August 12, 1934  Date of referral:  09/16/15               Reason for consult:  Facility Placement                Permission sought to share information with:  Family Supports Permission granted to share information::  Yes, Verbal Permission Granted  Name::     Wife, Gerald Stabs   Housing/Transportation Living arrangements for the past 2 months:  Single Family Home, [place] of Information:  Spouse Patient Interpreter Needed:  None Criminal Activity/Legal Involvement Pertinent to Current Situation/Hospitalization:  No - Comment as needed Significant Relationships:  Adult Children, Spouse Lives with:  Facility Resident Do you feel safe going back to the place where you live?  Yes Need for family participation in patient care:  Yes (Comment)  Care giving concerns:  No care giving concerns identified.    Social Worker assessment / plan:  CSW met with pt and wife to address consult. Pt was sleeping soundly. Pt was admitted from [facility] after a brief stay. Pt was recently discharged from [facility] and went to [facility] for short term rehab. Pt's wife is agreeable to pt returning to [facility]. At time of intiatal assessment pt was ready for discharge. PT needed to see pt prior to discharge for Human auth as it is pending. Pt will be discharging under an LOG, which was approved by Clinical Social Work Asst Mudlogger. Per MD, pt will have a nuero consult on 09/17/2015. Then pt will be assessed for discharge. Pt's wife is aware and agreeable to plan. CSW updated facility and Asst Director. CSW will continue to follow.   Employment status:  Retired Nurse, adult PT Recommendations:  [place] / Referral to community resources:  Other (Comment Required)  Patient/Family's Response to care:  Pt's wife was appreciative of  CSW support.   Patient/Family's Understanding of and Emotional Response to Diagnosis, Current Treatment, and Prognosis:  Pt's wife is eager to have pt return to SNF for STR so pt is able to return to baseline.   Emotional Assessment Appearance:  Appears stated age Attitude/Demeanor/Rapport:  Other (Sleeping) Affect (typically observed):  Other (Sleeping) Orientation:  Oriented to Self, Fluctuating Orientation (Suspected and/or reported Sundowners) Alcohol / Substance use:  Never Used Psych involvement (Current and /or in the community):  No (Comment)  Discharge Needs  Concerns to be addressed:  Adjustment to Illness Readmission within the last 30 days:  Yes Current discharge risk:  Chronically ill Barriers to Discharge:  Continued Medical Work up   [employer], LCSW 09/17/2015, 11:55 AM

## 2015-09-17 NOTE — Care Management (Signed)
Admitted to [facility] with the diagnosis of post crainiotomy/sepsis. Lives with wife, Margreta Journey [phone]). Primary care physician is Dr. Emily Filbert. Last seen  Dr. Jeb Levering for oncology issues 08/25/15.  Neuro surgery at [facility] performed a crainotomy to remove a frontal lobe tumor 09/05/15.  Transferred to [facility] 09/12/15 for rehabilitation.  Physical therapy evaluation in progress. Would like to return to [facility] when stable. Shelbie Ammons RN MSN CCM Care Management [phone]

## 2015-09-17 NOTE — Consult Note (Addendum)
Reason for Consult:Altered mental status Referring Physician: Manuella Ghazi  CC: Altered mental status  HPI: Jorge Maynard is an 81 y.o. male s/p removal of left frontal brain tumor on 09/05/2015.  Patient was sent to rehab afterward.  Although speech continued to be impaired patient was able to ambulate per wife. On DOA patient developed a cough, confusion and fever.  Patient was sent to the ED and admitted for sepsis.  Wife reports that the patient has been more lethargic since admission.  Although he was able to feed himself this morning and has had some spontaneous speech, he is now less able to follow commands, speaks less and is unable to ambulate.  Has had some low grade temperatures with Tmax of 100.8 since admission.     Past Medical History  Diagnosis Date  . 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 Hypertension   . Coronary artery disease   . Peripheral vascular disease ([place])   . GERD (gastroesophageal reflux disease)   . Gout   . Aortic stenosis   . Carotid stenosis   . Pathologic myopia, both eyes   . Posterior staphyloma   . 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 Malignant neoplasm of skin   . Memory changes   . Enlarged prostate   . Arthritis   . Complication of anesthesia   . PONV (postoperative nausea and vomiting)   . Aortic stenosis     moderate on 12/2013 echo    Past Surgical History  Procedure Laterality Date  . Colonoscopy N/A 01/07/2015    Procedure: COLONOSCOPY;  Surgeon: Lollie Sails, MD;  Location: [facility] ENDOSCOPY;  Service: Endoscopy;  Laterality: N/A;  . Coronary angioplasty      stents - 2 different times  . Eye surgery Bilateral     cataract surgery with lens implant  . Lung cancer surgery      2015   . Rotator cuff repair Right   . Fracture surgery    . Tendon repair Left     elbow  . Vasectomy    . Craniotomy Right 09/05/2015    Procedure: Stereotactic Right Craniotomy for resection of tumor with BrainLab;  Surgeon: Consuella Lose, MD;  Location: [facility]  NEURO ORS;  Service: Neurosurgery;  Laterality: Right;  Stereotactic Right Craniotomy for resection of tumor with BrainLab  . Application of cranial navigation N/A 09/05/2015    Procedure: APPLICATION OF CRANIAL NAVIGATION;  Surgeon: Consuella Lose, MD;  Location: [facility] NEURO ORS;  Service: Neurosurgery;  Laterality: N/A;    Family History  Problem Relation Age of Onset  . Heart disease Mother   . Heart disease Father     Social History:  reports that he quit smoking about 50 years ago. He has never used smokeless tobacco. He reports that he drinks alcohol. He reports that he does not use illicit drugs.  No Known Allergies  Medications:  I have reviewed the patient's current medications. Prior to Admission:  Prescriptions prior to admission  Medication Sig Dispense Refill Last Dose  . allopurinol (ZYLOPRIM) 300 MG tablet Take 300 mg by mouth daily.   09/14/2015 at Unknown time  . amLODipine (NORVASC) 10 MG tablet Take 10 mg by mouth at bedtime.    09/14/2015 at Unknown time  . aspirin 81 MG tablet Take 81 mg by mouth daily.   09/14/2015 at 0800  . atorvastatin (LIPITOR) 80 MG tablet Take 80 mg by mouth at bedtime.    09/14/2015 at Unknown time  . carvedilol (  COREG) 6.25 MG tablet Take 6.25 mg by mouth 2 (two) times daily with a meal.   09/14/2015 at Unknown time  . chlorthalidone (HYGROTON) 25 MG tablet Take 12.5 mg by mouth 2 (two) times daily.   09/14/2015 at Unknown time  . dexamethasone (DECADRON) 2 MG tablet Take 1 tablet (2 mg total) by mouth 2 (two) times daily. 10 tablet 0 09/14/2015 at Unknown time  . Esomeprazole Magnesium (NEXIUM 24HR PO) Take 22.3 mg by mouth daily.   09/14/2015 at Unknown time  . ibuprofen (ADVIL,MOTRIN) 200 MG tablet Take 400 mg by mouth every 6 (six) hours as needed for moderate pain.   prn at prn  . levETIRAcetam (KEPPRA) 500 MG tablet Take 1 tablet (500 mg total) by mouth 2 (two) times daily. 60 tablet 0 09/14/2015 at Unknown time  . pseudoephedrine-guaifenesin (MUCINEX  D) 60-600 MG 12 hr tablet Take 1 tablet by mouth every 12 (twelve) hours.   09/14/2015 at Unknown time  . ramipril (ALTACE) 10 MG capsule Take 10 mg by mouth 2 (two) times daily.   09/14/2015 at Unknown time  . [START ON 09/19/2015] clopidogrel (PLAVIX) 75 MG tablet Take 1 tablet (75 mg total) by mouth every morning. (Patient not taking: Reported on 09/14/2015)   Not Taking at Unknown time   Scheduled: . allopurinol  300 mg Oral Daily  . amLODipine  10 mg Oral QHS  . aspirin  81 mg Oral Daily  . atorvastatin  80 mg Oral QHS  . carvedilol  6.25 mg Oral BID WC  . dexamethasone  2 mg Oral BID WC  . docusate sodium  100 mg Oral BID  . enoxaparin (LOVENOX) injection  40 mg Subcutaneous Q24H  . feeding supplement (ENSURE ENLIVE)  237 mL Oral BID BM  . levETIRAcetam  500 mg Oral BID  . pantoprazole  40 mg Oral QAC breakfast  . ramipril  10 mg Oral BID  . sodium chloride flush  3 mL Intravenous Q12H    ROS: History obtained from chart review  General ROS: negative for - chills, fatigue, fever, night sweats, weight gain or weight loss Psychological ROS: negative for - behavioral disorder, hallucinations, memory difficulties, mood swings or suicidal ideation Ophthalmic ROS: negative for - blurry vision, double vision, eye pain or loss of vision ENT ROS: negative for - epistaxis, nasal discharge, oral lesions, sore throat, tinnitus or vertigo Allergy and Immunology ROS: negative for - hives or itchy/watery eyes Hematological and Lymphatic ROS: negative for - bleeding problems, bruising or swollen lymph nodes Endocrine ROS: negative for - galactorrhea, hair pattern changes, polydipsia/polyuria or temperature intolerance Respiratory ROS: cough, sputum production Cardiovascular ROS: negative for - chest pain, dyspnea on exertion, edema or irregular heartbeat Gastrointestinal ROS: negative for - abdominal pain, diarrhea, hematemesis, nausea/vomiting or stool incontinence Genito-Urinary ROS: negative for -  dysuria, hematuria, incontinence or urinary frequency/urgency Musculoskeletal ROS: negative for - joint swelling or muscular weakness Neurological ROS: as noted in HPI Dermatological ROS: negative for rash and skin lesion changes  Physical Examination: Blood pressure 91/45, pulse 66, temperature 97.8 F (36.6 C), temperature source Oral, resp. rate 20, height '5\' 8"'$  (1.727 m), weight 69.083 kg (152 lb 4.8 oz), SpO2 93 %.  HEENT-  Normocephalic, no lesions, without obvious abnormality.  Normal external eye and conjunctiva.  Normal TM's bilaterally.  Normal auditory canals and external ears. Normal external nose, mucus membranes and septum.  Normal pharynx. Cardiovascular- S1, S2 normal, pulses palpable throughout   Lungs- rhonchorous Abdomen- soft,  non-tender; bowel sounds normal; no masses,  no organomegaly Extremities- no edema Lymph-no adenopathy palpable Musculoskeletal-no joint tenderness, deformity or swelling Skin-warm and dry, no hyperpigmentation, vitiligo, or suspicious lesions  Neurological Examination Mental Status: Lethargic.  Difficult to arouse.  Once awakened no speech.  Unable to follow commands.   Cranial Nerves: II: Discs flat bilaterally; Blinks to bilateral confrontation.  Pupils equal, round, reactive to light and accommodation III,IV, VI: ptosis not present, extra-ocular motions intact bilaterally V,VII: right facial droop, facial light touch sensation normal bilaterally VIII: unable to test IX,X: gag reflex present XI: unable to test XII: unable to test Motor: When arms lifted actively patient able to maintain.  Would not maintain legs off the bed or move spontaneously.  Tone normal.   Sensory: Did not respond to noxious stimuli throughout Deep Tendon Reflexes: 2+ throughout with absent AJ's bilaterally Plantars: Right: upgoing   Left: upgoing Cerebellar: Unable to test due to patient's ability to follow commands.   Gait: Unable to test due to patient's  ability to follow commands.   Laboratory Studies:   Basic Metabolic Panel:  Recent Labs Lab 09/11/15 0416 09/12/15 0440 09/14/15 2047 09/15/15 0622 09/16/15 0504 09/17/15 0430  NA 139 138 134* 135 132* 131*  K 4.4 4.2 3.9 3.8 3.8 3.7  CL 104 101 100* 104 98* 97*  CO2 '26 27 24 24 29 25  '$ GLUCOSE 110* 134* 139* 105* 123* 135*  BUN 23* 22* 28* 22* 26* 30*  CREATININE 0.78 0.78 0.90 0.65 0.70 0.72  CALCIUM 8.4* 8.3* 8.2* 8.2* 8.2* 8.2*  MG 2.0 2.0  --   --   --   --   PHOS 3.2 3.7  --   --   --   --     Liver Function Tests:  Recent Labs Lab 09/11/15 0416 09/12/15 0440 09/14/15 2047  AST  --   --  29  ALT  --   --  80*  ALKPHOS  --   --  121  BILITOT  --   --  0.6  PROT  --   --  5.7*  ALBUMIN 1.9* 1.9* 2.5*   No results for input(s): LIPASE, AMYLASE in the last 168 hours. No results for input(s): AMMONIA in the last 168 hours.  CBC:  Recent Labs Lab 09/11/15 0416 09/12/15 0440 09/14/15 2047 09/15/15 0622 09/16/15 0504 09/17/15 0430  WBC 14.2* 14.0* 13.8* 12.2* 14.7* 13.7*  NEUTROABS 12.4* 12.8* 12.1*  --   --   --   HGB 11.2* 11.0* 11.5* 11.6* 11.8* 11.7*  HCT 32.7* 31.6* 35.1* 35.4* 34.4* 34.6*  MCV 90.3 89.8 92.0 91.6 89.8 89.9  PLT 158 159 180 170 210 218    Cardiac Enzymes: No results for input(s): CKTOTAL, CKMB, CKMBINDEX, TROPONINI in the last 168 hours.  BNP: Invalid input(s): POCBNP  CBG:  Recent Labs Lab 09/13/15 1631 09/13/15 2018 09/14/15 0707 09/14/15 1127 09/14/15 1654  GLUCAP 127* 141* 98 133* 158*    Microbiology: Results for orders placed or performed during the hospital encounter of 09/14/15  Blood Culture (routine x 2)     Status: None (Preliminary result)   Collection Time: 09/14/15  8:45 PM  Result Value Ref Range Status   Specimen Description BLOOD RIGHT ASSIST CONTROL  Final   Special Requests   Final    BOTTLES DRAWN AEROBIC AND ANAEROBIC  6CC AERO, 5CC ANA   Culture NO GROWTH 2 DAYS  Final   Report Status  PENDING  Incomplete  Urine culture     Status: None   Collection Time: 09/14/15  8:45 PM  Result Value Ref Range Status   Specimen Description URINE, RANDOM  Final   Special Requests NONE  Final   Culture NO GROWTH 1 DAY  Final   Report Status 09/16/2015 FINAL  Final  Blood Culture (routine x 2)     Status: None (Preliminary result)   Collection Time: 09/14/15  8:46 PM  Result Value Ref Range Status   Specimen Description BLOOD LEFT HAND  Final   Special Requests   Final    BOTTLES DRAWN AEROBIC AND ANAEROBIC  4CC AERO, 3CC ANA   Culture NO GROWTH 2 DAYS  Final   Report Status PENDING  Incomplete  Culture, sputum-assessment     Status: None   Collection Time: 09/15/15  9:48 AM  Result Value Ref Range Status   Specimen Description EXPECTORATED SPUTUM  Final   Special Requests NONE  Final   Sputum evaluation THIS SPECIMEN IS ACCEPTABLE FOR SPUTUM CULTURE  Final   Report Status 09/15/2015 FINAL  Final  Culture, respiratory (NON-Expectorated)     Status: None   Collection Time: 09/15/15  9:48 AM  Result Value Ref Range Status   Specimen Description EXPECTORATED SPUTUM  Final   Special Requests NONE Reflexed from [id number]  Final   Gram Stain   Final    GOOD SPECIMEN - 80-90% WBCS FEW SQUAMOUS EPITHELIAL CELLS PRESENT MODERATE WBC SEEN FEW GRAM POSITIVE COCCI IN PAIRS IN CLUSTERS FEW GRAM NEGATIVE COCCOBACILLI RARE GRAM NEGATIVE RODS RARE GRAM POSITIVE RODS    Culture LIGHT GROWTH ESCHERICHIA COLI  Final   Report Status 09/17/2015 FINAL  Final   Organism ID, Bacteria ESCHERICHIA COLI  Final      Susceptibility   Escherichia coli - MIC*    AMPICILLIN >=32 RESISTANT Resistant     CEFAZOLIN 8 SENSITIVE Sensitive     CEFEPIME <=1 SENSITIVE Sensitive     CEFTAZIDIME <=1 SENSITIVE Sensitive     CEFTRIAXONE <=1 SENSITIVE Sensitive     CIPROFLOXACIN <=0.25 SENSITIVE Sensitive     GENTAMICIN <=1 SENSITIVE Sensitive     IMIPENEM <=0.25 SENSITIVE Sensitive     TRIMETH/SULFA <=20  SENSITIVE Sensitive     AMPICILLIN/SULBACTAM >=32 RESISTANT Resistant     PIP/TAZO <=4 SENSITIVE Sensitive     Extended ESBL NEGATIVE Sensitive     * LIGHT GROWTH ESCHERICHIA COLI    Coagulation Studies: No results for input(s): LABPROT, INR in the last 72 hours.  Urinalysis:  Recent Labs Lab 09/14/15 2045  COLORURINE YELLOW*  LABSPEC 1.014  PHURINE 6.0  GLUCOSEU NEGATIVE  HGBUR NEGATIVE  BILIRUBINUR NEGATIVE  KETONESUR NEGATIVE  PROTEINUR NEGATIVE  NITRITE NEGATIVE  LEUKOCYTESUR NEGATIVE    Lipid Panel:     Component Value Date/Time   CHOL 160 06/13/2013 1840   TRIG 119 09/12/2015 0440   TRIG 162 06/13/2013 1840   HDL 48 06/13/2013 1840   VLDL 32 06/13/2013 1840   LDLCALC 80 06/13/2013 1840    HgbA1C: No results found for: HGBA1C  Urine Drug Screen:  No results found for: LABOPIA, COCAINSCRNUR, LABBENZ, AMPHETMU, THCU, LABBARB  Alcohol Level: No results for input(s): ETH in the last 168 hours.  Other results: EKG: sinus rhythm at 63 bpm.  Imaging: Ct Head Wo Contrast  09/16/2015  CLINICAL DATA:  Ongoing weakness. Status post left frontal lobe metastatic tumor resection on 09/05/2015. Fever. Swelling at the craniotomy site. EXAM: CT HEAD WITHOUT CONTRAST TECHNIQUE: Contiguous  axial images were obtained from the base of the skull through the vertex without intravenous contrast. COMPARISON:  09/14/2015. FINDINGS: Left frontal post craniotomy changes are again demonstrated with confluent low density in the underlying left frontal lobe. Decreased intracranial air. The previously demonstrated 13 mm thick low density left frontal fluid collection currently measures 15 mm in thickness on image 11. With the left frontal extra-axial fluid collection currently measures 19 mm in thickness on image number 9 and previously measured 19 mm in thickness on image 10. Small bilateral frontal subdural hygromas are unchanged. There is a small amount of high density in the right frontal  subdural space on image number 20, with little change. The ventricles remain mildly enlarged. No acute intracranial hemorrhage or mass effect. IMPRESSION: 1. No significant change in a small amount of subdural hemorrhage on the right within a small subdural hygroma. No acute hemorrhage. 2. Mild increase in size of the left frontal scalp fluid collection with no significant change in size of an underlying extra-axial fluid collection in the left frontal region. Again, differential considerations include abscess and pseudo meningocele. 3. Stable mild atrophy and mild chronic small vessel white matter ischemic changes. These results will be called to the ordering clinician or representative by the Radiologist Assistant, and communication documented in the PACS or zVision Dashboard. Electronically Signed   By: Claudie Revering M.D.   On: 09/16/2015 17:16     Assessment/Plan: 81 year old male presenting with fever, confusion and decreased functional ability.  Patient s/p craniotomy for tumor resection on 1/27.  Currently on steroids.  Initially presented with fever/spesis which may have precipitated functional decline which may take patient longer than usual to recover from with recent craniotomy.  Currently though patient also hyponatremic and with increasing BUN which may be affecting functional ability as well.  Patient will be more sensitive to metabolic derangements.  Head CT of the brain performed today due to decrease ion functional ability.  Personal review reveals some increase in edema although not significant.  Further testing recommended to rule out other possibilities.    Recommendations: 1.  MRI of the brain 2.  Increase in steroids to '4mg'$  q8h.  First dose IV  3.  EEG.  Patient on Keppra but has not had seizure activity.  Will rule out subclinical seizure activity.   4.  Agree with addressing metabolic issues  Alexis Goodell, MD Neurology [phone] 09/17/2015, 11:50 AM

## 2015-09-17 NOTE — Plan of Care (Signed)
Problem: SLP Dysphagia Goals Goal: Misc Dysphagia Goal Pt will safely tolerate po diet of least restrictive consistency w/ no overt s/s of aspiration noted by Staff/pt/family x3 sessions.    

## 2015-09-17 NOTE — Progress Notes (Signed)
[place] [facility] [place] NAME: Jorge Maynard    MR#:  [id number]  DATE OF BIRTH:  01-04-1935  SUBJECTIVE:  CHIEF COMPLAINT:   Chief Complaint  Patient presents with  . Code Sepsis  not following commands, speaks less and is unable to ambulate - MRI showing new CVA and midline shift? REVIEW OF SYSTEMS:  Review of Systems  Unable to perform ROS: mental acuity    DRUG ALLERGIES:  No Known Allergies VITALS:  Blood pressure 108/45, pulse 61, temperature 97.9 F (36.6 C), temperature source Oral, resp. rate 18, height '5\' 8"'$  (1.727 m), weight 69.083 kg (152 lb 4.8 oz), SpO2 94 %. PHYSICAL EXAMINATION:  Physical Exam  Constitutional: He is oriented to person, place, and time and well-developed, well-nourished, and in no distress.  HENT:  Head: Normocephalic and atraumatic.  Staples in place from left frontal craniotomy   Eyes: Conjunctivae and EOM are normal. Pupils are equal, round, and reactive to light.  Neck: Normal range of motion. Neck supple. No tracheal deviation present. No thyromegaly present.  Cardiovascular: Normal rate, regular rhythm and normal heart sounds.   Pulmonary/Chest: Effort normal and breath sounds normal. No respiratory distress. He has no wheezes. He exhibits no tenderness.  Abdominal: Soft. Bowel sounds are normal. He exhibits no distension. There is no tenderness.  Musculoskeletal:  Unable to evaluate due to stroke  Neurological: He is alert and oriented to person, place, and time. No cranial nerve deficit.  Does not speak much, responds only with short answers to simple questions. No dysarthria, no aphasia   Skin: Skin is warm and dry. No rash noted.  Psychiatric: Mood and affect normal.  Unable to assess in details as sleepy   LABORATORY PANEL:   CBC  Recent Labs Lab 09/17/15 0430  WBC 13.7*  HGB 11.7*  HCT 34.6*  PLT 218    ------------------------------------------------------------------------------------------------------------------ Chemistries   Recent Labs Lab 09/12/15 0440 09/14/15 2047  09/17/15 0430  NA 138 134*  < > 131*  K 4.2 3.9  < > 3.7  CL 101 100*  < > 97*  CO2 27 24  < > 25  GLUCOSE 134* 139*  < > 135*  BUN 22* 28*  < > 30*  CREATININE 0.78 0.90  < > 0.72  CALCIUM 8.3* 8.2*  < > 8.2*  MG 2.0  --   --   --   AST  --  29  --   --   ALT  --  80*  --   --   ALKPHOS  --  121  --   --   BILITOT  --  0.6  --   --   < > = values in this interval not displayed. RADIOLOGY:  Mr Kizzie Fantasia Contrast  09/17/2015  ADDENDUM REPORT: 09/17/2015 12:30 ADDENDUM: Study discussed by telephone with Dr. Max Sane on 09/17/2015 at 1207 hours. Electronically Signed   By: Genevie Ann M.D.   On: 09/17/2015 12:30  09/17/2015  CLINICAL DATA:  81 year old male status post left frontal metastasis resection. Metastatic sarcoma. Increased confusion and lethargy. Initial encounter. EXAM: MRI HEAD WITHOUT AND WITH CONTRAST TECHNIQUE: Multiplanar, multiecho pulse sequences of the brain and surrounding structures were obtained without and with intravenous contrast. CONTRAST:  28m MULTIHANCE GADOBENATE DIMEGLUMINE 529 MG/ML IV SOLN COMPARISON:  Early postoperative brain MRI 09/06/2015. More recent head CTs 09/16/2015 and earlier. Preoperative brain MRI 09/04/2015. FINDINGS: Almost completely resolved pneumocephalus since 09/06/2015. Progressed  right side subdural hematoma since that time, up to 5-6 mm in thickness now (2-3 mm postoperatively). Small volume along the interhemispheric fissure, in the right middle cranial fossa, and wrapping around the right occipital lobe (series 6, image 9). Mixed signal postoperative extra-axial collection on the left appears to communicate with the scalp soft tissues on series 5, image 17. Postoperative mass effect on the anterior left frontal lobe. Increased small but fairly widespread left side  subdural hematoma elsewhere (series 6, image 13. Increased rightward midline shift since the postoperative MRI, 4-5 mm. Unchanged trace subarachnoid hemorrhage along the superior right parietal gyrus on series 6, image 20. Trace intraventricular hemorrhage has regressed. Postoperative restricted diffusion along the resection site and involving the genu of the corpus callosum persists. However, involvement of the right cingulate gyrus on series 100, image 33 appears new. There are new scattered small foci of restricted diffusion in the midline right superior frontal gyrus tracking toward the peri-Rolandic cortex (series 100, images 46-41). No other new restricted diffusion. Major intracranial vascular flow voids are stable. Basilar cisterns remain patent. No ventriculomegaly. Negative pituitary, cervicomedullary junction and visualized cervical spine. Residual T2 and FLAIR hyperintensity surrounding the left anterior frontal lobe resection cavity is stable to mildly regressed. Following contrast, mild patchy peripheral resection cavity enhancement appears to mostly correlate to areas of postoperative restricted diffusion. No definite residual mass like enhancement. No other abnormal parenchymal enhancement. Visible internal auditory structures appear normal. Trace mastoid fluid. Paranasal sinuses are clear. Stable orbits soft tissues. Normal bone marrow signal. IMPRESSION: 1. Scattered small acute infarcts in the right ACA, right ACA/ MCA watershed territory. No associated hemorrhage or mass effect. 2. Progressed broad-based right greater than left subdural hematomas since the postoperative scan. Increased rightward midline shift of 4-5 mm. 3. Regional left anterior frontal lobe postoperative complex extra-axial collection has not significantly changed but may communicate with the scalp soft tissues via craniotomy. Stable trace subarachnoid hemorrhage. Regressed trace intraventricular hemorrhage. No ventriculomegaly.  4. Development of rim enhancement along the resection cavity, but favor related to postoperative ischemia. Electronically Signed: By: Genevie Ann M.D. On: 09/17/2015 12:04   ASSESSMENT AND PLAN:  * Acute stroke: removed sutures from craniotomy site, s/p MRI earlier showing  Scattered small acute infarcts in the right ACA, right ACA/ MCA watershed territory. No associated hemorrhage or mass effect. Worsening rt > lt subdural hematomas since the postoperative scan. Increased rightward midline shift of 4-5 mm. Also has Stable trace subarachnoid hemorrhage. D/w wife and Neurologist. This is concerning  - Will c/s onco, get EEG (per neuro) - INCREASE DOSE OF STEROIDS - already on asa + statin - will get palliative care c/s  * Sepsis - Ruled out. lactic acid normal, hemodynamically stable, off ABX  * HCAP (healthcare-associated pneumonia) - ruled out. Stop Abx.  * Essential (primary) hypertension - stable, continue home meds   * Peripheral vascular disease ([place]) - continue home meds   * S/P craniotomy - Incision seem to be healing well without signs or symptoms of infection at this time. Removed staples today  * Coronary artery disease involving native coronary artery of native heart without angina pectoris - continue home meds  * GERD (gastroesophageal reflux disease) - home dose PPI    All the records are reviewed and case discussed with Care Management/Social Worker. Management plans discussed with the patient, family and they are in agreement.  CODE STATUS: FULL CODE  TOTAL TIME TAKING CARE OF THIS PATIENT: 35 minutes.  More than 50% of the time was spent in counseling/coordination of care: YES (WIFE AT BEDSIDE EARLIER and the d/w her MRI findings on phone)  POSSIBLE D/C IN 1-2 DAYS, DEPENDING ON CLINICAL CONDITION. PALLIATIVE CARE AND ONCO input   [facility], Shandon Matson M.D on 09/17/2015 at 5:23 PM  Between 7am to 6pm - Pager - [phone]  After 6pm go to [email or web address] - password [email or web address]  [facility]  Office  [phone]  CC: Primary care physician; Rusty Aus., MD  Note: This dictation was prepared with Dragon dictation along with smaller phrase technology. Any transcriptional errors that result from this process are unintentional.

## 2015-09-17 NOTE — Progress Notes (Signed)
Had long d/w wife Minette Headland @ [phone]) and explained overall very poor prognosis.   She is in agreement to get palliative care c/s - seem to be leaning towards Hospice.  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  Will c/s Dr Oliva Bustard - wife is in agreement with same.  Time spent: 20 mins

## 2015-09-17 NOTE — Clinical Social Work Note (Signed)
CSW is continuing to follow for discharge planning. Dispo is pending. Facility is aware. CSW will continue to follow.   Darden Dates, MSW, LCSW Clinical Social Worker  [phone]

## 2015-09-18 ENCOUNTER — Inpatient Hospital Stay
Admit: 2015-09-18 | Discharge: 2015-09-18 | Disposition: A | Discharge status: Home / Self Care | Payer: Medicare PPO | Attending: Neurology | Admitting: Neurology

## 2015-09-18 DIAGNOSIS — C760 Malignant neoplasm of head, face and neck: Secondary | ICD-10-CM | POA: Insufficient documentation

## 2015-09-18 DIAGNOSIS — R4182 Altered mental status, unspecified: Secondary | ICD-10-CM | POA: Insufficient documentation

## 2015-09-18 LAB — ALBUMIN: ALBUMIN: 2.3 g/dL — AB (ref 3.5–5.0)

## 2015-09-18 MED ORDER — LEVETIRACETAM 100 MG/ML PO SOLN: 500.0000 mg | Freq: Two times a day (BID) | ORAL | Status: DC

## 2015-09-18 MED ORDER — LORAZEPAM 0.5 MG PO TABS: 0.5000 mg | ORAL_TABLET | Freq: Three times a day (TID) | ORAL | Status: DC

## 2015-09-18 MED ORDER — PROCHLORPERAZINE 25 MG RE SUPP: 25.0000 mg | Freq: Three times a day (TID) | RECTAL | Status: DC | PRN

## 2015-09-18 MED ORDER — LEVETIRACETAM 100 MG/ML PO SOLN
500.0000 mg | Freq: Two times a day (BID) | ORAL | Status: DC
  Administered 2015-09-18 – 2015-09-22 (×9): 500 mg via ORAL
  Filled 2015-09-18 (×12): qty 5

## 2015-09-18 MED ORDER — BISACODYL 5 MG PO TBEC: 5.0000 mg | DELAYED_RELEASE_TABLET | Freq: Every day | ORAL | Status: AC | PRN

## 2015-09-18 MED ORDER — MORPHINE SULFATE (CONCENTRATE) 10 MG/0.5ML PO SOLN
5.0000 mg | ORAL | Status: DC | PRN
  Administered 2015-09-18 – 2015-09-22 (×4): 5 mg via ORAL
  Filled 2015-09-18 (×4): qty 1

## 2015-09-18 MED ORDER — MORPHINE SULFATE (CONCENTRATE) 10 MG /0.5 ML PO SOLN: 5.0000 mg | ORAL | Status: DC | PRN

## 2015-09-18 MED ORDER — DEXAMETHASONE 4 MG PO TABS: 4.0000 mg | ORAL_TABLET | Freq: Three times a day (TID) | ORAL | Status: AC

## 2015-09-18 MED ORDER — BISACODYL 10 MG RE SUPP
10.0000 mg | Freq: Every day | RECTAL | Status: DC | PRN
  Administered 2015-09-20: 10 mg via RECTAL
  Filled 2015-09-18: qty 1

## 2015-09-18 MED ORDER — IPRATROPIUM-ALBUTEROL 0.5-2.5 (3) MG/3ML IN SOLN: 3.0000 mL | RESPIRATORY_TRACT | Status: AC | PRN

## 2015-09-18 NOTE — Clinical Social Work Note (Signed)
CSW met with pt and family. CSW updated pt's family that facility has cleaned out pt's room and his belongings are available to be picked up at family's convenience. Palliative Care is following. CSW will continue to follow.   Darden Dates, MSW, LCSW Clinical Social Worker  [phone]

## 2015-09-18 NOTE — Progress Notes (Signed)
Subjective: Patient awake and alert this morning when I enter the room.  Is drinking and holding his own cup and straw.  Family at the bedside.  Objective: Current vital signs: BP 128/55 mmHg  Pulse 59  Temp(Src) 98.8 F (37.1 C) (Oral)  Resp 20  Ht '5\' 8"'$  (1.727 m)  Wt 69.083 kg (152 lb 4.8 oz)  BMI 23.16 kg/m2  SpO2 92% Vital signs in last 24 hours: Temp:  [97.9 F (36.6 C)-98.8 F (37.1 C)] 98.8 F (37.1 C) (02/08 2024) Pulse Rate:  [59-77] 59 (02/09 0454) Resp:  [18-20] 20 (02/08 2024) BP: (108-128)/(45-61) 128/55 mmHg (02/09 0454) SpO2:  [92 %-94 %] 92 % (02/09 0454)  Intake/Output from previous day: 02/08 0701 - 02/09 0700 In: 120 [P.O.:120] Out: -  Intake/Output this shift: Total I/O In: 120 [P.O.:120] Out: -  Nutritional status: DIET DYS 3 Room service appropriate?: Yes with Assist; Fluid consistency:: Thin Diet - low sodium heart healthy  Neurologic Exam: Mental Status: Alert. Gave some one word responses. Followed some commands.  Cranial Nerves: II: Discs flat bilaterally; Blinks to bilateral confrontation. Pupils equal, round, reactive to light and accommodation III,IV, VI: ptosis not present, extra-ocular motions intact bilaterally V,VII: right facial droop, facial light touch sensation normal bilaterally VIII: unable to test IX,X: gag reflex present XI: unable to test XII: unable to test Motor: When arms lifted actively patient able to maintain. Moves left arm around spontaneously more than the right.  Able to lift both legs off the bed to command, right more than left.      Lab Results: Basic Metabolic Panel:  Recent Labs Lab 09/12/15 0440 09/14/15 2047 09/15/15 0622 09/16/15 0504 09/17/15 0430  NA 138 134* 135 132* 131*  K 4.2 3.9 3.8 3.8 3.7  CL 101 100* 104 98* 97*  CO2 '27 24 24 29 25  '$ GLUCOSE 134* 139* 105* 123* 135*  BUN 22* 28* 22* 26* 30*  CREATININE 0.78 0.90 0.65 0.70 0.72  CALCIUM 8.3* 8.2* 8.2* 8.2* 8.2*  MG 2.0  --    --   --   --   PHOS 3.7  --   --   --   --     Liver Function Tests:  Recent Labs Lab 09/12/15 0440 09/14/15 2047 09/18/15 0530  AST  --  29  --   ALT  --  80*  --   ALKPHOS  --  121  --   BILITOT  --  0.6  --   PROT  --  5.7*  --   ALBUMIN 1.9* 2.5* 2.3*   No results for input(s): LIPASE, AMYLASE in the last 168 hours. No results for input(s): AMMONIA in the last 168 hours.  CBC:  Recent Labs Lab 09/12/15 0440 09/14/15 2047 09/15/15 0622 09/16/15 0504 09/17/15 0430  WBC 14.0* 13.8* 12.2* 14.7* 13.7*  NEUTROABS 12.8* 12.1*  --   --   --   HGB 11.0* 11.5* 11.6* 11.8* 11.7*  HCT 31.6* 35.1* 35.4* 34.4* 34.6*  MCV 89.8 92.0 91.6 89.8 89.9  PLT 159 180 170 210 218    Cardiac Enzymes: No results for input(s): CKTOTAL, CKMB, CKMBINDEX, TROPONINI in the last 168 hours.  Lipid Panel:  Recent Labs Lab 09/12/15 0440  TRIG 119    CBG:  Recent Labs Lab 09/13/15 1631 09/13/15 2018 09/14/15 0707 09/14/15 1127 09/14/15 1654  GLUCAP 127* 141* 98 133* 158*    Microbiology: Results for orders placed or performed during the hospital encounter of  09/14/15  Blood Culture (routine x 2)     Status: None (Preliminary result)   Collection Time: 09/14/15  8:45 PM  Result Value Ref Range Status   Specimen Description BLOOD RIGHT ASSIST CONTROL  Final   Special Requests   Final    BOTTLES DRAWN AEROBIC AND ANAEROBIC  6CC AERO, 5CC ANA   Culture NO GROWTH 3 DAYS  Final   Report Status PENDING  Incomplete  Urine culture     Status: None   Collection Time: 09/14/15  8:45 PM  Result Value Ref Range Status   Specimen Description URINE, RANDOM  Final   Special Requests NONE  Final   Culture NO GROWTH 1 DAY  Final   Report Status 09/16/2015 FINAL  Final  Blood Culture (routine x 2)     Status: None (Preliminary result)   Collection Time: 09/14/15  8:46 PM  Result Value Ref Range Status   Specimen Description BLOOD LEFT HAND  Final   Special Requests   Final    BOTTLES  DRAWN AEROBIC AND ANAEROBIC  4CC AERO, 3CC ANA   Culture NO GROWTH 3 DAYS  Final   Report Status PENDING  Incomplete  Culture, sputum-assessment     Status: None   Collection Time: 09/15/15  9:48 AM  Result Value Ref Range Status   Specimen Description EXPECTORATED SPUTUM  Final   Special Requests NONE  Final   Sputum evaluation THIS SPECIMEN IS ACCEPTABLE FOR SPUTUM CULTURE  Final   Report Status 09/15/2015 FINAL  Final  Culture, respiratory (NON-Expectorated)     Status: None   Collection Time: 09/15/15  9:48 AM  Result Value Ref Range Status   Specimen Description EXPECTORATED SPUTUM  Final   Special Requests NONE Reflexed from [id number]  Final   Gram Stain   Final    GOOD SPECIMEN - 80-90% WBCS FEW SQUAMOUS EPITHELIAL CELLS PRESENT MODERATE WBC SEEN FEW GRAM POSITIVE COCCI IN PAIRS IN CLUSTERS FEW GRAM NEGATIVE COCCOBACILLI RARE GRAM NEGATIVE RODS RARE GRAM POSITIVE RODS    Culture LIGHT GROWTH ESCHERICHIA COLI  Final   Report Status 09/17/2015 FINAL  Final   Organism ID, Bacteria ESCHERICHIA COLI  Final      Susceptibility   Escherichia coli - MIC*    AMPICILLIN >=32 RESISTANT Resistant     CEFAZOLIN 8 SENSITIVE Sensitive     CEFEPIME <=1 SENSITIVE Sensitive     CEFTAZIDIME <=1 SENSITIVE Sensitive     CEFTRIAXONE <=1 SENSITIVE Sensitive     CIPROFLOXACIN <=0.25 SENSITIVE Sensitive     GENTAMICIN <=1 SENSITIVE Sensitive     IMIPENEM <=0.25 SENSITIVE Sensitive     TRIMETH/SULFA <=20 SENSITIVE Sensitive     AMPICILLIN/SULBACTAM >=32 RESISTANT Resistant     PIP/TAZO <=4 SENSITIVE Sensitive     Extended ESBL NEGATIVE Sensitive     * LIGHT GROWTH ESCHERICHIA COLI    Coagulation Studies: No results for input(s): LABPROT, INR in the last 72 hours.  Imaging: Ct Head Wo Contrast  09/16/2015  CLINICAL DATA:  Ongoing weakness. Status post left frontal lobe metastatic tumor resection on 09/05/2015. Fever. Swelling at the craniotomy site. EXAM: CT HEAD WITHOUT CONTRAST TECHNIQUE:  Contiguous axial images were obtained from the base of the skull through the vertex without intravenous contrast. COMPARISON:  09/14/2015. FINDINGS: Left frontal post craniotomy changes are again demonstrated with confluent low density in the underlying left frontal lobe. Decreased intracranial air. The previously demonstrated 13 mm thick low density left frontal fluid collection currently measures  15 mm in thickness on image 11. With the left frontal extra-axial fluid collection currently measures 19 mm in thickness on image number 9 and previously measured 19 mm in thickness on image 10. Small bilateral frontal subdural hygromas are unchanged. There is a small amount of high density in the right frontal subdural space on image number 20, with little change. The ventricles remain mildly enlarged. No acute intracranial hemorrhage or mass effect. IMPRESSION: 1. No significant change in a small amount of subdural hemorrhage on the right within a small subdural hygroma. No acute hemorrhage. 2. Mild increase in size of the left frontal scalp fluid collection with no significant change in size of an underlying extra-axial fluid collection in the left frontal region. Again, differential considerations include abscess and pseudo meningocele. 3. Stable mild atrophy and mild chronic small vessel white matter ischemic changes. These results will be called to the ordering clinician or representative by the Radiologist Assistant, and communication documented in the PACS or zVision Dashboard. Electronically Signed   By: Claudie Revering M.D.   On: 09/16/2015 17:16   Mr Jeri Cos HK Contrast  09/17/2015  ADDENDUM REPORT: 09/17/2015 12:30 ADDENDUM: Study discussed by telephone with Dr. Max Sane on 09/17/2015 at 1207 hours. Electronically Signed   By: Genevie Ann M.D.   On: 09/17/2015 12:30  09/17/2015  CLINICAL DATA:  81 year old male status post left frontal metastasis resection. Metastatic sarcoma. Increased confusion and lethargy.  Initial encounter. EXAM: MRI HEAD WITHOUT AND WITH CONTRAST TECHNIQUE: Multiplanar, multiecho pulse sequences of the brain and surrounding structures were obtained without and with intravenous contrast. CONTRAST:  65m MULTIHANCE GADOBENATE DIMEGLUMINE 529 MG/ML IV SOLN COMPARISON:  Early postoperative brain MRI 09/06/2015. More recent head CTs 09/16/2015 and earlier. Preoperative brain MRI 09/04/2015. FINDINGS: Almost completely resolved pneumocephalus since 09/06/2015. Progressed right side subdural hematoma since that time, up to 5-6 mm in thickness now (2-3 mm postoperatively). Small volume along the interhemispheric fissure, in the right middle cranial fossa, and wrapping around the right occipital lobe (series 6, image 9). Mixed signal postoperative extra-axial collection on the left appears to communicate with the scalp soft tissues on series 5, image 17. Postoperative mass effect on the anterior left frontal lobe. Increased small but fairly widespread left side subdural hematoma elsewhere (series 6, image 13. Increased rightward midline shift since the postoperative MRI, 4-5 mm. Unchanged trace subarachnoid hemorrhage along the superior right parietal gyrus on series 6, image 20. Trace intraventricular hemorrhage has regressed. Postoperative restricted diffusion along the resection site and involving the genu of the corpus callosum persists. However, involvement of the right cingulate gyrus on series 100, image 33 appears new. There are new scattered small foci of restricted diffusion in the midline right superior frontal gyrus tracking toward the peri-Rolandic cortex (series 100, images 46-41). No other new restricted diffusion. Major intracranial vascular flow voids are stable. Basilar cisterns remain patent. No ventriculomegaly. Negative pituitary, cervicomedullary junction and visualized cervical spine. Residual T2 and FLAIR hyperintensity surrounding the left anterior frontal lobe resection cavity is  stable to mildly regressed. Following contrast, mild patchy peripheral resection cavity enhancement appears to mostly correlate to areas of postoperative restricted diffusion. No definite residual mass like enhancement. No other abnormal parenchymal enhancement. Visible internal auditory structures appear normal. Trace mastoid fluid. Paranasal sinuses are clear. Stable orbits soft tissues. Normal bone marrow signal. IMPRESSION: 1. Scattered small acute infarcts in the right ACA, right ACA/ MCA watershed territory. No associated hemorrhage or mass effect. 2. Progressed broad-based right  greater than left subdural hematomas since the postoperative scan. Increased rightward midline shift of 4-5 mm. 3. Regional left anterior frontal lobe postoperative complex extra-axial collection has not significantly changed but may communicate with the scalp soft tissues via craniotomy. Stable trace subarachnoid hemorrhage. Regressed trace intraventricular hemorrhage. No ventriculomegaly. 4. Development of rim enhancement along the resection cavity, but favor related to postoperative ischemia. Electronically Signed: By: Genevie Ann M.D. On: 09/17/2015 12:04    Medications:  I have reviewed the patient's current medications. Scheduled: . allopurinol  300 mg Oral Daily  . aspirin  81 mg Oral Daily  . atorvastatin  80 mg Oral QHS  . dexamethasone  4 mg Oral 3 times per day  . docusate sodium  100 mg Oral BID  . enoxaparin (LOVENOX) injection  40 mg Subcutaneous Q24H  . feeding supplement (ENSURE ENLIVE)  237 mL Oral BID BM  . levETIRAcetam  500 mg Oral BID  . pantoprazole  40 mg Oral QAC breakfast  . sodium chloride flush  3 mL Intravenous Q12H    Assessment/Plan: MRI of the brain personally reviewed and shows increased edema and bilateral small acute infarcts.  Subdurals slightly increased as well.  EEG shows no evidence of subclinical seizure activity.  Patient improved today, likely due to increased dose of steroids.   Multiple infarcts likely a consequence of a hypercoagulable state related to his cancer.    Recommendations: 1.  Continue therapy.  Patient will likely be better able to cooperate today. 2.  Continue Decadron at current dose.  May be tapered as an outpatient when appropriate. 3.  Continue Keppra at '500mg'$  BID 4.  Continue ASA, would increase to '325mg'$  daily 5.  Echocardiogram 6.  BLE dopplers 7.  Telemetry 8.  Frequent neuro checks   LOS: 4 days   Alexis Goodell, MD Neurology [phone] 09/18/2015  12:08 PM

## 2015-09-18 NOTE — Care Management Important Message (Signed)
Important Message  Patient Details  Name: Jorge Maynard MRN: [id number] Date of Birth: 07-26-1935   Medicare Important Message Given:  Yes    Juliann Pulse A Araiyah Cumpton 09/18/2015, 10:04 AM

## 2015-09-18 NOTE — Progress Notes (Signed)
Palliative Medicine Inpatient Consult Follow Up Note   Name: Jorge Maynard Date: 09/18/2015 MRN: [id number]  DOB: 1934-11-16  Referring Physician: Max Sane, MD  Palliative Care consult requested for this 81 y.o. male for goals of medical therapy in patient with sarcoma with mets.    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   After wedge resection he was followed by Dr. Oliva Bustard, oncologist, and he was found in Oct 2016 to have an enlarging right upper lobe mass that was separate from the original tumor and PET positive. He underwent Radiation Therapy for this right lung metastatic lesion. He began to have significant exertional dyspnea in January. He developed slurred speech and headaches and was admitted on Jan 27 th to [facility] for craniotomy and resection of a large left frontal tumor. He had developed mild right hemiparesis. He then went to [facility] for rehab.   Three days later, on Feb 5th, he was admitted here due to an increased cough. It was thought he might have a pneumonia but a CT scan showed no infiltrates. Procalcitonin was low and the plan was to send him back to the SNF for continued rehab.   But, he had an MRI of his brain today which showed findings that are changing that plan. The MRI showed the following: 1. Scattered small acute infarcts in the right ACA, right ACA/ MCA watershed territory. No associated hemorrhage or mass effect. 2. Progressed broad-based right greater than left subdural hematomas since the postoperative scan. Increased rightward midline shift of 4-5 mm. 3.  Regional left anterior frontal lobe postoperative complex extra-axial collection has not significantly changed but may communicate with the scalp soft tissues via craniotomy. Stable trace subarachnoid hemorrhage. Regressed trace intraventricular hemorrhage. No ventriculomegaly. 4. Development of rim enhancement along the resection cavity, but favor related to postoperative ischemia  --------------------------------------------------------------------- DISCUSSIONS AND DECISIONS: I met with pt's wife and one son today.  Family is getting a lot of different messages such as 'pt not being a rehab candidate' and 'pt might now be a rehab candidate' and 'pt may be a Hospice Home candidate' or 'pt may not be a Hospice Home candidate'.    Part of the difficulty is that the pt has had some waxing and waning symptoms.  Yesterday, he could do almost nothing. Today, he is somewhat better and can follow some simple commands, albeit briefly.  He falls asleep often. He did eat his lunch.    I discussed all the different options for pts care approach with family. They do not want his life prolonged into a phase where there will be more suffering.  They would prefer he be placed in [place], but the truth is that IF he is eating reasonably well, I cannot say that he will die within 2-3 weeks.  If he is skipping most meals and if liquid intake is very low, then that would be the case.  We will have to see how he does.  I have changed his meds to comfort care but have kept Keppra and the Decadron and have discussed this approach with family --and they agree.   His  echo shows a nl EF --But, he has SEVERE AORTIC STENOSIS noted.  We knew he had aortic stenosis but I did not know the severity.  Pts wife talked to me about long term care placement with Hospice and she also talked to me about Hospice coming into their home.  I described each of these scenarios to her and she and I will talk again tomorrow after we see  how he is doing with eating the rest of today and tomorrow.    I spoke with PT also.  Wife does not feel he would benefit from PT b/c he keeps falling asleep and would not have enough endurance for therapy.    Today, I have talked with the neurologist twice, the attending twice and other team members multiple times.  All these conversations with the entire care team took well over 2 hours.   ----------------------------------------------------------------------- IMPRESSION Sarcoma ---likely source was right temporal lesion c/w atypical fibroxanthoma -treated with [facility] in 2011) -- with met to right upper lung in 2015 treated in 2015 with resection of right upper lung mass and then he had a new right upper lung nodule treated with radiation. Then he developed a brain met and had a craniotomy to resect this. Now, he is worsened.  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  CODE STATUS: DNR--as of now   PAST MEDICAL HISTORY: Past Medical History  Diagnosis Date  . Sarcoma ([place]) 12/31/2014  . Hypertension   . Coronary artery disease   . Peripheral vascular disease ([place])   . GERD (gastroesophageal reflux disease)   . Gout   . Aortic stenosis   . Carotid stenosis   . Pathologic myopia, both eyes   . Posterior staphyloma   . 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 Malignant neoplasm of skin   . Memory changes   . Enlarged prostate   . Arthritis   . Complication of anesthesia   . PONV (postoperative nausea and vomiting)   . Aortic stenosis     moderate on 12/2013 echo    PAST  SURGICAL HISTORY:  Past Surgical History  Procedure Laterality Date  . Colonoscopy N/A 01/07/2015    Procedure: COLONOSCOPY;  Surgeon: Lollie Sails, MD;  Location: [facility] ENDOSCOPY;  Service: Endoscopy;  Laterality: N/A;  . Coronary angioplasty      stents - 2 different times  . Eye surgery Bilateral     cataract surgery with lens implant  . Lung cancer surgery      2015   . Rotator cuff repair Right   . Fracture surgery    . Tendon repair Left     elbow  . Vasectomy    . Craniotomy Right 09/05/2015    Procedure: Stereotactic Right Craniotomy for resection of tumor with BrainLab;  Surgeon: Consuella Lose, MD;  Location: [facility] NEURO ORS;  Service: Neurosurgery;  Laterality: Right;  Stereotactic Right Craniotomy for resection of tumor with BrainLab  . Application of cranial navigation N/A 09/05/2015    Procedure: APPLICATION OF CRANIAL NAVIGATION;  Surgeon: Consuella Lose, MD;  Location: [facility] NEURO ORS;  Service: Neurosurgery;  Laterality: N/A;    Vital  Signs: BP 112/52 mmHg  Pulse 74  Temp(Src) 98.2 F (36.8 C) (Oral)  Resp 20  Ht 5' 8"  (1.727 m)  Wt 69.083 kg (152 lb 4.8 oz)  BMI 23.16 kg/m2  SpO2 94% Filed Weights   09/14/15 2043 09/14/15 2347  Weight: 72.3 kg (159 lb 6.3 oz) 69.083 kg (152 lb 4.8 oz)    Estimated body mass index is 23.16 kg/(m^2) as calculated from the following:   Height as of this encounter: 5' 8"  (1.727 m).   Weight as of this encounter: 69.083 kg (152 lb 4.8 oz).  PHYSICAL EXAM: More alert with eyes open and also follows a bit. Did not speak to me but has spoken No JVD or TM Hrt rrr no m Lungs cta ant Abd soft and NT Ext no cyanosis or mottling  LABS: CBC:    Component Value Date/Time   WBC 13.7* 09/17/2015 0430   WBC 10.2 09/05/2014 1058   HGB 11.7* 09/17/2015 0430   HGB 13.7 09/05/2014 1058   HCT 34.6* 09/17/2015 0430   HCT 40.6 09/05/2014 1058   PLT 218 09/17/2015 0430   PLT 238 09/05/2014 1058   MCV 89.9 09/17/2015 0430   MCV  91 09/05/2014 1058   NEUTROABS 12.1* 09/14/2015 2047   NEUTROABS 6.5 09/05/2014 1058   LYMPHSABS 0.7* 09/14/2015 2047   LYMPHSABS 2.2 09/05/2014 1058   MONOABS 0.6 09/14/2015 2047   MONOABS 0.8 09/05/2014 1058   EOSABS 0.3 09/14/2015 2047   EOSABS 0.6 09/05/2014 1058   BASOSABS 0.1 09/14/2015 2047   BASOSABS 0.1 09/05/2014 1058   Comprehensive Metabolic Panel:    Component Value Date/Time   NA 131* 09/17/2015 0430   NA 141 09/05/2014 1058   K 3.7 09/17/2015 0430   K 3.7 09/05/2014 1058   CL 97* 09/17/2015 0430   CL 103 09/05/2014 1058   CO2 25 09/17/2015 0430   CO2 27 09/05/2014 1058   BUN 30* 09/17/2015 0430   BUN 21* 09/05/2014 1058   CREATININE 0.72 09/17/2015 0430   CREATININE 1.04 09/05/2014 1058   GLUCOSE 135* 09/17/2015 0430   GLUCOSE 87 09/05/2014 1058   CALCIUM 8.2* 09/17/2015 0430   CALCIUM 8.5 09/05/2014 1058   AST 29 09/14/2015 2047   AST 18 09/05/2014 1058   ALT 80* 09/14/2015 2047   ALT 29 09/05/2014 1058   ALKPHOS 121 09/14/2015 2047   ALKPHOS 114 09/05/2014 1058   BILITOT 0.6 09/14/2015 2047   BILITOT 0.6 09/05/2014 1058   PROT 5.7* 09/14/2015 2047   PROT 7.0 09/05/2014 1058   ALBUMIN 2.3* 09/18/2015 0530   ALBUMIN 3.5 09/05/2014 1058   More than 50% of the visit was spent in counseling/coordination of care: YES  Time Spent:  120 min

## 2015-09-18 NOTE — Progress Notes (Signed)
Speech Therapy Note: reviewed chart notes; consulted NSG re: pt's status today. Per NSG report, pt has had a change in status (neurological); Neurology has been consulted to assess. NSG indicated pt able to participate in helping ot feed self and appeared to tolerate po's of thin liquids and soft solids w/ no overt s/s of aspiration noted by NSG or pt/wife. Rec. Continue w/ current diet and aspiration precautions as ordered. ST will be available for any further assessment and/or education as needed while admitted. NSG agreed.

## 2015-09-18 NOTE — Progress Notes (Signed)
PT Cancellation Note  Patient Details Name: CHIPPER KOUDELKA MRN: [id number] DOB: 06-22-1935   Cancelled Treatment:    Reason Eval/Treat Not Completed: Medical issues which prohibited therapy (Per chart review, noted results of MRI and discussion of possible transition to comfort care.  Will hold treatment at this time until clear goals established and patient appropriate for additional activity as appropriate.)   Ithzel Fedorchak H. Owens Shark, PT, DPT, NCS 09/18/2015, 7:55 AM [phone]

## 2015-09-18 NOTE — Progress Notes (Signed)
*  PRELIMINARY RESULTS* Echocardiogram 2D Echocardiogram has been performed.  Laqueta Jean Hege 09/18/2015, 7:46 AM

## 2015-09-18 NOTE — Progress Notes (Signed)
   09/18/15 1300  Clinical Encounter Type  Visited With Patient and family together  Visit Type Initial  Referral From Nurse  Consult/Referral To Chaplain  Spiritual Encounters  Spiritual Needs Emotional  Stress Factors  Patient Stress Factors Health changes;Major life changes;Loss of control  Family Stress Factors Major life changes  Met w/patient's spouse briefly. Remained with patient as family took lunch break. Patient remained quiet during the visit. When asked if I could sit, he replied that it would be nice. Smiled a few times and gave a few one-word responses to questions. Will follow up with future visits. Chap. Kaneesha Constantino G. [place]

## 2015-09-18 NOTE — Procedures (Signed)
ELECTROENCEPHALOGRAM REPORT   Patient: Jorge Maynard       Room #: 124A-AA EEG No. ID: [id number] Age: 81 y.o.        Sex: male Referring Physician: Manuella Ghazi Report Date:  09/18/2015        Interpreting Physician: Alexis Goodell  History: Jorge Maynard is an 81 y.o. male s/p craniotomy (left) with altered mental status  Medications:  Scheduled: . allopurinol  300 mg Oral Daily  . aspirin  81 mg Oral Daily  . atorvastatin  80 mg Oral QHS  . dexamethasone  4 mg Oral 3 times per day  . docusate sodium  100 mg Oral BID  . enoxaparin (LOVENOX) injection  40 mg Subcutaneous Q24H  . feeding supplement (ENSURE ENLIVE)  237 mL Oral BID BM  . levETIRAcetam  500 mg Oral BID  . pantoprazole  40 mg Oral QAC breakfast  . sodium chloride flush  3 mL Intravenous Q12H    Conditions of Recording:  This is a 16 channel EEG carried out with the patient in the awake state.  Description:  The waking background activity consists of a low voltage, fairly well organized, 8 Hz alpha activity, seen from the parieto-occipital and posterior temporal regions.  Low voltage fast activity, poorly organized, is seen anteriorly and is at times superimposed on more posterior regions.  A mixture of theta and alpha rhythms are seen from the central and temporal regions.  This activity is often slowed over the left hemisphere, particularly in the fronto-central region due to an intermittent underlying polymorphic delta and theta activity.  Also noted is intermittent sharp activity, again in the left fronto-central region  Stage II sleep is not obtained. Hyperventilation was not performed.  Intermittent photic stimulation was performed but failed to illicit any change in the tracing.   IMPRESSION: This is an abnormal electroencephalogram due to left focal slowing and left fronto-central sharp activity.  Findings are consistent with the patient's history of a left craniotomy.     Alexis Goodell,  MD Neurology [phone] 09/18/2015, 8:41 AM

## 2015-09-18 NOTE — Progress Notes (Signed)
[place] [facility] [place] NAME: Jorge Maynard    MR#:  [id number]  DATE OF BIRTH:  27-Nov-1934  SUBJECTIVE:  CHIEF COMPLAINT:   Chief Complaint  Patient presents with  . Code Sepsis   somewhat more alert this morning, moving his extremities, although falling back sleep very easily REVIEW OF SYSTEMS:  Review of Systems  Unable to perform ROS: mental acuity    DRUG ALLERGIES:  No Known Allergies VITALS:  Blood pressure 112/52, pulse 74, temperature 98.2 F (36.8 C), temperature source Oral, resp. rate 20, height '5\' 8"'$  (1.727 m), weight 69.083 kg (152 lb 4.8 oz), SpO2 94 %. PHYSICAL EXAMINATION:  Physical Exam  Constitutional: He is oriented to person, place, and time and well-developed, well-nourished, and in no distress.  HENT:  Head: Normocephalic and atraumatic.  Staple site healing - left frontal craniotomy area  Eyes: Conjunctivae and EOM are normal. Pupils are equal, round, and reactive to light.  Neck: Normal range of motion. Neck supple. No tracheal deviation present. No thyromegaly present.  Cardiovascular: Normal rate, regular rhythm and normal heart sounds.   Pulmonary/Chest: Effort normal and breath sounds normal. No respiratory distress. He has no wheezes. He exhibits no tenderness.  Abdominal: Soft. Bowel sounds are normal. He exhibits no distension. There is no tenderness.  Musculoskeletal:  Unable to evaluate due to stroke  Neurological: He is alert and oriented to person, place, and time. No cranial nerve deficit.  Does not speak much, responds only with short answers to simple questions. No dysarthria, no aphasia   Skin: Skin is warm and dry. No rash noted.  Psychiatric: Mood and affect normal.  Unable to assess in details as sleepy   LABORATORY PANEL:   CBC  Recent Labs Lab 09/17/15 0430  WBC 13.7*  HGB 11.7*  HCT 34.6*  PLT 218    ------------------------------------------------------------------------------------------------------------------ Chemistries   Recent Labs Lab 09/12/15 0440 09/14/15 2047  09/17/15 0430  NA 138 134*  < > 131*  K 4.2 3.9  < > 3.7  CL 101 100*  < > 97*  CO2 27 24  < > 25  GLUCOSE 134* 139*  < > 135*  BUN 22* 28*  < > 30*  CREATININE 0.78 0.90  < > 0.72  CALCIUM 8.3* 8.2*  < > 8.2*  MG 2.0  --   --   --   AST  --  29  --   --   ALT  --  80*  --   --   ALKPHOS  --  121  --   --   BILITOT  --  0.6  --   --   < > = values in this interval not displayed. RADIOLOGY:  No results found. ASSESSMENT AND PLAN:  * Acute stroke:  Neurology recommending continuing Decadron, Keppra, aspirin.  Obtain 2-D echo and bilateral lower extremity Dopplers. Overall, I feel patient still has a very poor long-term prognosis   * Sepsis - Ruled out. lactic acid normal, hemodynamically stable, off ABX  * HCAP (healthcare-associated pneumonia) - ruled out. Stop Abx.  * Essential (primary) hypertension - stable, continue home meds   * Peripheral vascular disease ([place]) - continue home meds   * S/P craniotomy - Incision seem to be healing well without signs or symptoms of infection at this time. Removed staples today  * Coronary artery disease involving native coronary artery of native heart without angina pectoris - continue home meds  *  GERD (gastroesophageal reflux disease) - home dose PPI    All the records are reviewed and case discussed with Care Management/Social Worker. Management plans discussed with the patient, family and they are in agreement.  CODE STATUS: FULL CODE  TOTAL TIME TAKING CARE OF THIS PATIENT: 35 minutes.   More than 50% of the time was spent in counseling/coordination of care: YES (WIFE AT BEDSIDE EARLIER .  Patient's son was also at bedside and discussed at length Overall prognosis)  POSSIBLE D/C IN AM, DEPENDING ON CLINICAL CONDITION.  I would hope family and  all the consultant leaning towards hospice home based on my discussion earlier today.  Considering very poor long-term oral prognosis   Seleta Hovland M.D on 09/18/2015 at 4:01 PM  Between 7am to 6pm - Pager - [phone]  After 6pm go to [email or web address] - password [email or web address] [facility]  Office  [phone]  CC: Primary care physician; Rusty Aus., MD  Note: This dictation was prepared with Dragon dictation along with smaller phrase technology. Any transcriptional errors that result from this process are unintentional.

## 2015-09-18 NOTE — Progress Notes (Signed)
Progress Notes   Jorge Maynard (MR# [id number])      Progress Notes Info    Author Note Status Last Update User Last Update Date/Time   Forest Gleason, MD Signed Forest Gleason, MD 05/23/2015 10:03 AM    Progress Notes    [facility] [place] [facility] [phone] [phone]     Jorge Maynard OB: Feb 27, 1935 MR#: [id number] RAX#:[id number]  Patient Care Team: Rusty Aus, MD as PCP - General (Internal Medicine)  CHIEF COMPLAINT:  No chief complaint on file.   Oncology History   1.right upper lobe mass, PET positive, status post laser resection in September of 2015 Pleomorphic undifferentiated sarcoma favor metastases. Margins are clear 2.status post right temporal MOH'S surgery in 2011. 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  Patient was evaluated by me as outpatient and had dysphasia had a 5 cm frontal lobe tumor patient was referred to neurosurgeon and [facility] and patient underwent craniotomy resection was transferred  Rehab .  Pathology was pending.  Patient made some progress in rehabilitation according to wife was walking and making progress however all of a sudden became confused and disoriented was brought in the hospital infection pulmonary infection and sepsis was suspected.  Patient's condition when downhill has been seen by multiple  Consultant . Patient's condition has been declining.  Somewhat confused and disoriented. High dose of steroid being treated with antibiotics with your progress at the present time REVIEW OF SYSTEMS:  GENERAL:  Feels good. Active. No fevers, sweats or weight loss. PERFORMANCE STATUS (ECOG): 0 HEENT: No visual changes, runny nose, sore throat, mouth sores or tenderness. Lungs: No shortness of breath or cough. No hemoptysis. Cardiac: No chest pain, palpitations, orthopnea, or PND. GI: No nausea, vomiting, diarrhea, constipation, melena or hematochezia. GU: No urgency, frequency, dysuria, or hematuria. Musculoskeletal: No back pain. No joint pain. No muscle tenderness. Extremities: No pain or swelling. Skin: No rashes or skin changes. Neuro: No headache, numbness or weakness, balance or coordination issues. Endocrine: No diabetes, thyroid issues, hot flashes or night sweats. Psych: No mood changes, depression or anxiety. Pain: No focal pain. Review of systems: All other systems reviewed and found to be negative. As per HPI. Otherwise, a complete review of systems is negatve.  PAST MEDICAL HISTORY: Past Medical History  Diagnosis Date  . Sarcoma ([place]) 12/31/2014  . Hypertension   . Coronary artery disease   . Peripheral vascular disease ([place])   . GERD (gastroesophageal reflux disease)   . Gout   . Aortic stenosis   . Carotid stenosis   . Pathologic myopia, both eyes   . Posterior staphyloma   . 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 Malignant neoplasm of skin     PAST SURGICAL HISTORY: Past Surgical History  Procedure Laterality Date  . Colonoscopy N/A 01/07/2015    Procedure: COLONOSCOPY; Surgeon: Lollie Sails, MD; Location: [facility]COPY; Service: Endoscopy; Laterality: N/A;   [facility] [place] Progress Note-Oncology Follow Up [Authored: 28-Jan-16 07:33]- for Visit: [id number], Complete, Entered, Signed in Full, General  Note Type  Oncology Follow Up    HPI:  Referred by Dr. Genevive Bi.  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  Chief Complaint:     Historian  Patient     Presenting Problem  Patient is here for further evaluation regarding pulmonary nodules. Patient offers no complaints today. Patient does have living will.     Negative Symptoms  fever, chills, anorexia, weakness, nausea, vomiting, diarrhea, constipation, cough, shortness of breath, palpitations, burning with urination, urinary frequency, headache, numbness/tingling, back pain, hip pain, rash    Subjective:     Chief Complaint/Diagnosis:     1.right upper lobe mass, PET positive, status post laser resection in September of 2015  Pleomorphic undifferentiated sarcoma favor metastases. Margins are clear  2.status post right temporal MOH'S surgery in 2011. 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 On that scan they noticed that he had a right upper lobe mass that was not present 2 years prior when he had his similar neck x-rays obtained for evaluation of ataxia. Jorge Maynard states that he is in excellent physical shape. He works out 3 times a week in [employer] and plays golf. He does not notice any shortness of breath. He is able to walk up and down steps. He does state that over the course of the last several years he has noticed that he is not quite as strong as he used to be but he feels that relative to his peers he is in excellent physical shap  patient underwent biopsy with wedge resection and was consistent with pleomorphic undifferentiated sarcoma.  patient had a previous surgeon he from the right temple area and was found to have similar sarcoma..  September 05, 2014  Patient is here for further follow-up  regarding metastatic sarcoma to the lung status post resection no evidence of clinical disease. Remains asymptomatic. Had a repeat CT scan of the chest. No cough or shortness of breath no chest pain  Dec 31, 2014\patient came today for the follow-up. Since last evaluation did not have any other new significant problem. Patient had some basal cell and squamous cell removed from the face. No cough or shortness of breath  Review of Systems:     Gen. status: Patient is alert oriented not in any acute distress  Lungs: No cough or shortness of breath or hemoptysis  Cardiovascular system:  No chest pain. No palpitation. No paroxysmal nocturnal dyspnea.  HEENT:  No headache. No hearing loss. No ear pain. No nosebleed or congestion. No sore throat. No difficulty swallowing  Gastro intestinal system:  No heartburn. No nausea or vomiting. No abdominal pain. No diarrhea. No constipation. No rectal bleeding.  Skin:  No evidence of ecchymosis or rash.  Neurological system: Denies any headache dizziness.  All other systems have been reviewed  Allergies:  No Known Allergies:  Significant History/PMH:   Stent, Cardiac:   Hypertension:  Preventive Screening:      Has patient had any of the following test?  Colonscopy      Last Colonoscopy:  about 2 years ago     Smoking History:  Smoking History quit 50 years ago.  PFSH:      Comments: no significant family history of diabetes, colon cancer or any other malignancy or heart disease      Additional Past Medical and Surgical History: His past medical history is significant for a basal cell carcinoma of the nose as well as some type of skin malignancy requiring Mohs surgery on his right temple. He states that the diagnosis was an unusual variant of a very aggressive tumor. Those details are not available to me at this time. He has also had  some shoulder and elbow surgery as well as coronary artery stents.  the right temple biopsy          ADVANCED DIRECTIVES:  Patient does not have any living will or healthcare power of attorney. Information was given . Available resources had been discussed. We will follow-up on subsequent appointments regarding this issue  HEALTH MAINTENANCE: Social History  Substance Use Topics  . Smoking status: Former Smoker    Quit date: 08/08/1965  . Smokeless tobacco: None  . Alcohol Use: None     No Known Allergies  Current Outpatient Prescriptions  Medication Sig Dispense Refill  . allopurinol (ZYLOPRIM) 300 MG tablet Take 300 mg by mouth daily.    Marland Kitchen amLODipine (NORVASC) 10 MG tablet Take 10 mg by mouth daily.    Marland Kitchen aspirin 81 MG tablet Take 81 mg by mouth daily.    Marland Kitchen atorvastatin (LIPITOR) 80 MG tablet Take 80 mg by mouth daily.    . carvedilol (COREG) 6.25 MG tablet Take 6.25 mg by mouth 2 (two) times daily with a meal.    . chlorthalidone (HYGROTON) 25 MG tablet Take 12.5 mg by mouth 2 (two) times daily.    . clopidogrel (PLAVIX) 75 MG tablet Take 75 mg by mouth every morning.    . Esomeprazole Magnesium (NEXIUM 24HR PO) Take 22.3 mg by mouth daily.    Marland Kitchen ibuprofen (ADVIL,MOTRIN) 200 MG tablet Take 400 mg by mouth every 6 (six) hours as needed for moderate pain.    . polyethylene glycol-electrolytes (NULYTELY/GOLYTELY) 420 G solution Take 4,000 mLs by mouth once.  0  . ramipril (ALTACE) 10 MG capsule Take 10 mg by mouth 2 (two) times daily.    . sildenafil (REVATIO) 20 MG tablet Take 20 mg by mouth as needed (erectile dysfunction).      No current facility-administered medications for this visit.   Facility-Administered Medications Ordered in Other Visits  Medication Dose Route Frequency Provider Last Rate Last Dose  . fludeoxyglucose F - 18 (FDG) injection 12.75 milli  Curie 12.75 milli Curie Intravenous Once PRN Medication Radiologist, MD  12.75 milli Curie at 05/20/15 0840    OBJECTIVE:  There were no vitals filed for this visit. There is no weight on file to calculate BMI. ECOG FS:0 - Asymptomatic  PHYSICAL EXAM: General status: Performance status is good. Patient has not lost significant weight. Since last evaluation there is no significant change in the general status HEENT: No evidence of stomatitis. Sclera and conjunctivae :: No jaundice. pale looking. Lungs: Air entry equal on both sides. No rhonchi. No rales. Cardiac: Heart sounds are normal. No pericardial rub.  No murmur. Lymphatic system: Cervical, axillary, inguinal, lymph nodes not palpable GI: Abdomen is soft.liver and spleen not palpable. No ascites. Bowel sounds are normal. No other palpable masses. No tenderness . Lower extremity: No edema Neurological system: Patient is very confused and disoriented.  Difficult to examine the complete neurological system                                               IMPRESSION: 1. Scattered small acute infarcts in the right ACA, right ACA/ MCA watershed territory. No associated hemorrhage or mass effect. 2. Progressed broad-based right greater than left subdural hematomas since the postoperative scan. Increased rightward midline shift of 4-5 mm. 3. Regional left anterior frontal lobe postoperative complex extra-axial collection has not significantly changed but may communicate with the scalp soft tissues via craniotomy. Stable trace subarachnoid hemorrhage. Regressed trace intraventricular hemorrhage. No ventriculomegaly. 4. Development of rim enhancement along the resection cavity, but favor related to postoperative ischemia.    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  Treated with resection  and radiation.  In the past Abnormal CT scan and MRI scan with the left temporal lobe mass   MRI scan has been reviewed shows acute cerebral infarct in the cerebral artery location. Pulmonary infection and acute cerebral infarcts brought patient's downhill at present time. I had a long discussion with wife and wife has expressed desire for palliative and hospice care as she could not then H patient's care at home Possibility of hospice home transfer may be acceptable in this situation. By any chance the patient's condition improves then further treatment options can be discussed.   y time with any questions, concerns, or complaints.    No matching staging information was found for the patient.  Forest Gleason, MD 05/23/2015 9:59 AM

## 2015-09-19 LAB — CULTURE, BLOOD (ROUTINE X 2)
CULTURE: NO GROWTH
Culture: NO GROWTH

## 2015-09-19 MED ORDER — TRAMADOL HCL 50 MG PO TABS: 50.0000 mg | ORAL_TABLET | Freq: Four times a day (QID) | ORAL | Status: DC | PRN

## 2015-09-19 MED ORDER — BISACODYL 10 MG RE SUPP: 10.0000 mg | Freq: Every day | RECTAL | Status: DC | PRN

## 2015-09-19 MED ORDER — ASPIRIN 81 MG PO CHEW: 81.0000 mg | CHEWABLE_TABLET | Freq: Every day | ORAL | Status: AC

## 2015-09-19 MED ORDER — ASPIRIN 81 MG PO CHEW
81.0000 mg | CHEWABLE_TABLET | Freq: Every day | ORAL | Status: DC
  Administered 2015-09-19 – 2015-09-22 (×4): 81 mg via ORAL
  Filled 2015-09-19 (×4): qty 1

## 2015-09-19 NOTE — Progress Notes (Signed)
Physical Therapy Treatment Patient Details Name: Jorge Maynard MRN: [id number] DOB: December 05, 1934 Today's Date: 09/19/2015    History of Present Illness Patient is a 81 y/o male that recently underwent L frontotemporal craniotomy at [facility] for tumor resection. He has previously undergone stereotatic radiation for lung mets. He developed some confusion and non-productive cough while at SNF.     PT Comments    Patient with noted improvement in responsiveness and ability to participate with skilled PT interventions this date.  Requiring slightly less physical assist with bed mobility and unsupported sitting balance (though now demonstrating moderate pushing behaviors), and able to initiate OOB to chair transfers (lateral transfer over level surfaces, max/dep assist). Given improvement in alertness, participation and response to therapy, feel patient could continue to benefit from skilled PT in STR to maximize safety/indep with functional transfers and decrease caregiver burden upon return home (though do anticipate WC level as long-term primary means of mobility). Discussed patient performance and discharge recs with care team (patient, wife, palliative care and social work)--all in agreement to pursue STR at this time.   Follow Up Recommendations  SNF     Equipment Recommendations       Recommendations for Other Services       Precautions / Restrictions Precautions Precautions: Fall Restrictions Weight Bearing Restrictions: No    Mobility  Bed Mobility Overal bed mobility: Needs Assistance Bed Mobility: Supine to Sit     Supine to sit: Max assist     General bed mobility comments: hand-over-hand for UE placement on rails.  Once positioned, patient able to partially assist with truncal elevation, but dependent for LE management.  Transfers Overall transfer level: Needs assistance   Transfers: Lateral/Scoot Transfers          Lateral/Scoot Transfers: Total assist;Max  assist General transfer comment: constant cuing/assist for midline orientation, forward trunk lean and lateral movement. Hand-over-hand assist for UE position during transfer.  Ambulation/Gait             General Gait Details: Unsafe/unable   Stairs            Wheelchair Mobility    Modified Rankin (Stroke Patients Only)       Balance Overall balance assessment: Needs assistance Sitting-balance support: No upper extremity supported;Feet supported Sitting balance-Leahy Scale: Poor Sitting balance - Comments: fluctuates from mod/max assist for unsupported sitting balance.  Moderate pushing behaviors (towards R) noted this date, but able to volitionally break with functional activity (dynamic reaching for items on breakfast tray)                            Cognition Arousal/Alertness: Awake/alert Behavior During Therapy: WFL for tasks assessed/performed Overall Cognitive Status: Impaired/Different from baseline                 General Comments: visually acknowledges therapist throughout session; verbally responds to therapist approx 60% time (1-2 word answers; able to sing ABCs with therapist); follows simple verbal commands approx 50% time (improved with upright positioning) and repeats gestures 75% time.    Exercises Other Exercises Other Exercises: Unsupported sitting edge of bed, mod/max assist--patient beginning to initiate self-righting at times, but lacks functional endurance and proper midline orientation to indep sustain at this time.  Mod pushing behaviors (towards R) noted, but able to volitionally break with functional reaching (able to accurately reach for items with L UE when instructed verbally 75% time). Improved reponsiveness to all external stimuli  this date, spontaneously scanning room environment, responding to auditory stimuli and spontaneously moving extremities with functional activities (able to use L UE to feed self once transferred  to chair)    General Comments        Pertinent Vitals/Pain Pain Assessment: Faces Faces Pain Scale: Hurts little more Pain Location: headache? Pain Descriptors / Indicators: Grimacing Pain Intervention(s): Monitored during session;Repositioned;Patient requesting pain meds-RN notified;Limited activity within patient's tolerance    Home Living                      Prior Function            PT Goals (current goals can now be found in the care plan section) Acute Rehab PT Goals Patient Stated Goal: return home PT Goal Formulation: Patient unable to participate in goal setting Time For Goal Achievement: 09/29/15 Potential to Achieve Goals: Fair Progress towards PT goals: Progressing toward goals    Frequency  Min 2X/week    PT Plan Current plan remains appropriate    Co-evaluation             End of Session   Activity Tolerance: Patient tolerated treatment well Patient left: in chair;with call bell/phone within reach;with chair alarm set;with family/visitor present     Time: 3225-6720 PT Time Calculation (min) (ACUTE ONLY): 43 min  Charges:  $Therapeutic Activity: 8-22 mins $Neuromuscular Re-education: 23-37 mins                    G Codes:      Latriece Anstine H. Owens Shark, PT, DPT, NCS 09/19/2015, 10:37 AM [phone]

## 2015-09-19 NOTE — Progress Notes (Signed)
[place] [facility] [place] NAME: Jorge Maynard    MR#:  [id number]  DATE OF BIRTH:  [birth year]  SUBJECTIVE:  CHIEF COMPLAINT:   Chief Complaint  Patient presents with  . Code Sepsis  per report he's eating somewhat better and participating with therapy. On my eval - he's sleepy, not wanting to talk REVIEW OF SYSTEMS:  Review of Systems  Unable to perform ROS: mental acuity   DRUG ALLERGIES:  No Known Allergies VITALS:  Blood pressure 101/52, pulse 68, temperature 98.5 F (36.9 C), temperature source Oral, resp. rate 18, height '5\' 8"'$  (1.727 m), weight 69.083 kg (152 lb 4.8 oz), SpO2 95 %. PHYSICAL EXAMINATION:  Physical Exam  Constitutional: He is oriented to person, place, and time and well-developed, well-nourished, and in no distress.  HENT:  Head: Normocephalic and atraumatic.  Staple site healing - left frontal craniotomy area  Eyes: Conjunctivae and EOM are normal. Pupils are equal, round, and reactive to light.  Neck: Normal range of motion. Neck supple. No tracheal deviation present. No thyromegaly present.  Cardiovascular: Normal rate, regular rhythm and normal heart sounds.   Pulmonary/Chest: Effort normal and breath sounds normal. No respiratory distress. He has no wheezes. He exhibits no tenderness.  Abdominal: Soft. Bowel sounds are normal. He exhibits no distension. There is no tenderness.  Musculoskeletal:  Unable to evaluate due to stroke  Neurological: He is alert and oriented to person, place, and time. No cranial nerve deficit.  Does not speak much, responds only with short answers to simple questions. No dysarthria, no aphasia   Skin: Skin is warm and dry. No rash noted.  Psychiatric: Mood and affect normal.  Unable to assess in details as sleepy   LABORATORY PANEL:   CBC  Recent Labs Lab 09/17/15 0430  WBC 13.7*  HGB 11.7*  HCT 34.6*  PLT 218    ------------------------------------------------------------------------------------------------------------------ Chemistries   Recent Labs Lab 09/14/15 2047  09/17/15 0430  NA 134*  < > 131*  K 3.9  < > 3.7  CL 100*  < > 97*  CO2 24  < > 25  GLUCOSE 139*  < > 135*  BUN 28*  < > 30*  CREATININE 0.90  < > 0.72  CALCIUM 8.2*  < > 8.2*  AST 29  --   --   ALT 80*  --   --   ALKPHOS 121  --   --   BILITOT 0.6  --   --   < > = values in this interval not displayed. RADIOLOGY:  No results found. ASSESSMENT AND PLAN:  * Acute stroke:  Neurology recommending continuing Decadron, Keppra, aspirin.  Overall, I feel patient still has a very poor long-term prognosis but now with him eating better than before and participating with PT - D/C plan is changed to STR/SNF with palliative care   * Sepsis - Ruled out. lactic acid normal, hemodynamically stable, off ABX  * HCAP (healthcare-associated pneumonia) - ruled out. Stop Abx.  * Essential (primary) hypertension - stable, continue home meds   * Peripheral vascular disease ([place]) - continue home meds   * S/P craniotomy - Incision seem to be healing well without signs or symptoms of infection at this time. Removed staples today  * Coronary artery disease involving native coronary artery of native heart without angina pectoris - continue home meds  * GERD (gastroesophageal reflux disease) - home dose PPI    All the records are  reviewed and case discussed with Care Management/Social Worker. Management plans discussed with the patient, family and they are in agreement.  CODE STATUS: FULL CODE  TOTAL TIME TAKING CARE OF THIS PATIENT: 35 minutes.   More than 50% of the time was spent in counseling/coordination of care: YES (d/w wife ON PHONE)  POSSIBLE D/C EARLY NEXT WEEK, DEPENDING ON CLINICAL CONDITION. And placement   [facility], Teshia Mahone M.D on 09/19/2015 at 3:45 PM  Between 7am to 6pm - Pager - [phone]  After 6pm go to  [email or web address] - password [email or web address] [place] [facility]  Office  [phone]  CC: Primary care physician; Rusty Aus., MD  Note: This dictation was prepared with Dragon dictation along with smaller phrase technology. Any transcriptional errors that result from this process are unintentional.

## 2015-09-19 NOTE — Progress Notes (Signed)
OT Cancellation Note  Patient Details Name: Jorge Maynard MRN: [id number] DOB: [birth year]   Cancelled Treatment:    Reason Eval/Treat Not Completed: Fatigue/lethargy limiting ability to participate.  Order received and chart reviewed as well as discussion with PT about status and progress.  Pt in bed and unable to keep eyes open to participate in OT eval after several attempts to alert patient.  Will attempt again on Sunday 09/21/15.    Chrys Racer, OTR/L ascom [phone]   Wofford,Susan 09/19/2015, 1:07 PM

## 2015-09-19 NOTE — Progress Notes (Signed)
Writer was consulted by palliative care regarding Hospice home on 2/9. Patient has now improved. Will not engage with family at this time. Thank you. Berton Lan, BSN, [place] and [facility] [place], [facility] [phone]

## 2015-09-19 NOTE — Progress Notes (Signed)
Palliative Medicine Inpatient Consult Follow Up Note   Name: Jorge Maynard Date: 09/19/2015 MRN: [id number]  DOB: [birth year]  Referring Physician: Max Sane, MD  Palliative Care consult requested for this 81 y.o. male for goals of medical therapy in patient with stage IV metestatic sarcoma.  TODAY'S DISCUSSIONS AND DECISIONS: Pt is even more improved today than he was yesterday in terms of his degree of responsiveness and oral intake. He ate all of his french toast and banana and drank all of his Ensure and Orange Juice this am --feeding himself, slowly, but adequately.  It helped to have him up in bedside chair (he doesn't eat as well in bed).   PT has seen pt. He was able to follow commands and work with PT and did not fall asleep right after.  He seems to do better in the morning than later in the day (recently at least).  He is always going to be wheelchair bound, but he might improve from being a person who needs a LIFT for transfers now to someone who can assist with transfers. He cannot walk or stand, but he might be able to transfer with help of therapy and this would decrease caregiver burden. He participated quite well with PT this morning.  Pt also has no pain symptoms --EXCEPT he does get occasional headaches. He was grimacing a bit earlier and said he had a headache.  He will stay on the Decadron as this steroid is helping and is considered a comfort med for his headaches. He should also stay on Keppra --and ASA might be restarted also.    I informed his wife that HE WILL NEED A PALLIATIVE CARE CONSULT IN THE FACILITY.  This person will check on him and help transition the pt to Hospice in whatever setting (long term care, home, or hospice home).  He will likely have therapy/ rehab for about 3 weeks and then he should be seen by Hospice (he is now a hospice candidate --just NOT imminently dying and NOT a candidate for Hospice Home.)  Once he stops eating consistently, he would be  appropriate for end of life care --and family indicates they would want the option of Hospice Home at that time.   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   After wedge resection he was followed by Dr. Oliva Bustard, oncologist, and he was found in Oct 2016 to have an enlarging right upper lobe mass that was separate from the original tumor and PET positive. He underwent Radiation Therapy for this right lung metastatic lesion. He began to have significant exertional dyspnea in January. He developed slurred speech and headaches and was admitted on Jan 27 th to [facility] for craniotomy and resection of a large left frontal tumor. He had developed mild right hemiparesis. He then went to [facility] for rehab.   Three days later, on Feb 5th, he was admitted here due to an increased cough. It was thought he might have a pneumonia but a CT scan showed no infiltrates. Procalcitonin was low and the plan was to send him back to the SNF for continued rehab.   But, he  had an MRI of his brain today which showed findings that are changing that plan. The MRI showed the following: 1. Scattered small acute infarcts in the right ACA, right ACA/ MCA watershed territory. No associated hemorrhage or mass effect. 2. Progressed broad-based right greater than left subdural hematomas since the postoperative scan. Increased rightward midline shift of 4-5 mm. 3. Regional left anterior frontal lobe postoperative complex extra-axial collection has not significantly changed but may communicate with the scalp soft tissues via craniotomy. Stable  trace subarachnoid hemorrhage. Regressed trace intraventricular hemorrhage. No ventriculomegaly. 4. Development of rim enhancement along the resection cavity, but favor related to postoperative ischemia  IMPRESSION Sarcoma ---likely source was right temporal lesion c/w atypical fibroxanthoma -treated with [facility] in 2011) -- with met to right upper lung in 2015 treated in 2015 with resection of right upper lung mass and then he had a new right upper lung nodule treated with radiation. Then he developed a brain met and had a craniotomy to resect this. Now, he is worsened.  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 Sarcoma ([place]) 12/31/2014  . Hypertension   . Coronary artery disease   . Peripheral vascular disease ([place])   . GERD (gastroesophageal reflux disease)   . Gout   . Aortic stenosis   . Carotid stenosis   . Pathologic myopia, both eyes   . Posterior staphyloma   . 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 Malignant neoplasm of skin   . Memory changes   . Enlarged prostate   . Arthritis   . Complication of anesthesia   . PONV (postoperative nausea and vomiting)   . Aortic stenosis     moderate on 12/2013 echo    PAST SURGICAL HISTORY:  Past Surgical History  Procedure Laterality Date  . Colonoscopy N/A 01/07/2015    Procedure: COLONOSCOPY;  Surgeon: Lollie Sails, MD;  Location: [facility] ENDOSCOPY;  Service: Endoscopy;  Laterality: N/A;  . Coronary  angioplasty      stents - 2 different times  . Eye surgery Bilateral     cataract surgery with lens implant  . Lung cancer surgery      2015   . Rotator cuff repair Right   . Fracture surgery    . Tendon repair Left     elbow  . Vasectomy    . Craniotomy Right 09/05/2015    Procedure: Stereotactic Right Craniotomy for resection of tumor with BrainLab;  Surgeon: Consuella Lose, MD;  Location: [place] NEURO ORS;  Service: Neurosurgery;  Laterality: Right;  Stereotactic Right Craniotomy for resection of tumor with BrainLab  . Application of cranial navigation N/A 09/05/2015    Procedure: APPLICATION OF CRANIAL NAVIGATION;  Surgeon: Consuella Lose, MD;  Location: [place] NEURO ORS;  Service: Neurosurgery;  Laterality: N/A;    Vital Signs: BP 109/63 mmHg  Pulse 75  Temp(Src) 97.5 F (36.4 C) (Oral)  Resp 20  Ht 5' 8"  (  1.727 m)  Wt 69.083 kg (152 lb 4.8 oz)  BMI 23.16 kg/m2  SpO2 97% Filed Weights   09/14/15 2043 09/14/15 2347  Weight: 72.3 kg (159 lb 6.3 oz) 69.083 kg (152 lb 4.8 oz)    Estimated body mass index is 23.16 kg/(m^2) as calculated from the following:   Height as of this encounter: 5' 8"  (1.727 m).   Weight as of this encounter: 69.083 kg (152 lb 4.8 oz).  PHYSICAL EXAM: Pt is up in bedside chair --still eating and feeding himself He spoke to me --greeting me and smiling  --speech is slow and slurred and brief but appropriate.  He focuses and follows.  EOMI Nares patent OP clear No JVD or TM Hrt rrr no m Lungs sound cta Abd soft and NT Ext no mottling or cyanosis  LABS: CBC:    Component Value Date/Time   WBC 13.7* 09/17/2015 0430   WBC 10.2 09/05/2014 1058   HGB 11.7* 09/17/2015 0430   HGB 13.7 09/05/2014 1058   HCT 34.6* 09/17/2015 0430   HCT 40.6 09/05/2014 1058   PLT 218 09/17/2015 0430   PLT 238 09/05/2014 1058   MCV 89.9 09/17/2015 0430   MCV 91 09/05/2014 1058   NEUTROABS 12.1* 09/14/2015 2047   NEUTROABS 6.5 09/05/2014 1058   LYMPHSABS 0.7*  09/14/2015 2047   LYMPHSABS 2.2 09/05/2014 1058   MONOABS 0.6 09/14/2015 2047   MONOABS 0.8 09/05/2014 1058   EOSABS 0.3 09/14/2015 2047   EOSABS 0.6 09/05/2014 1058   BASOSABS 0.1 09/14/2015 2047   BASOSABS 0.1 09/05/2014 1058   Comprehensive Metabolic Panel:    Component Value Date/Time   NA 131* 09/17/2015 0430   NA 141 09/05/2014 1058   K 3.7 09/17/2015 0430   K 3.7 09/05/2014 1058   CL 97* 09/17/2015 0430   CL 103 09/05/2014 1058   CO2 25 09/17/2015 0430   CO2 27 09/05/2014 1058   BUN 30* 09/17/2015 0430   BUN 21* 09/05/2014 1058   CREATININE 0.72 09/17/2015 0430   CREATININE 1.04 09/05/2014 1058   GLUCOSE 135* 09/17/2015 0430   GLUCOSE 87 09/05/2014 1058   CALCIUM 8.2* 09/17/2015 0430   CALCIUM 8.5 09/05/2014 1058   AST 29 09/14/2015 2047   AST 18 09/05/2014 1058   ALT 80* 09/14/2015 2047   ALT 29 09/05/2014 1058   ALKPHOS 121 09/14/2015 2047   ALKPHOS 114 09/05/2014 1058   BILITOT 0.6 09/14/2015 2047   BILITOT 0.6 09/05/2014 1058   PROT 5.7* 09/14/2015 2047   PROT 7.0 09/05/2014 1058   ALBUMIN 2.3* 09/18/2015 0530   ALBUMIN 3.5 09/05/2014 1058     More than 50% of the visit was spent in counseling/coordination of care: YES  Time Spent:  35 min

## 2015-09-19 NOTE — Plan of Care (Signed)
Problem: Education: Goal: Knowledge of [place] [facility] Education information/materials will improve Outcome: Progressing Education to wife  Problem: Fluid Volume: Goal: Ability to maintain a balanced intake and output will improve Outcome: Progressing Ate and drank well today

## 2015-09-19 NOTE — Progress Notes (Addendum)
PT is now recommending STR.  CSW met with patient and wife at bedside.  Would like patient to return to [facility].  Call to Maudie Mercury at [facility].  Will start insurance auth.for [employer].  Per Maudie Mercury at [facility] [place] not able to take patient until Monday.   Casimer Lanius. [place] Work Department [phone] 11:15 AM

## 2015-09-19 NOTE — Progress Notes (Signed)
Nutrition Follow-up  DOCUMENTATION CODES:   Non-severe (moderate) malnutrition in context of acute illness/injury  INTERVENTION:   Coordination of Care: no calorie count envelope posted in room on visit, RD took this am tray ticket and recorded po intake. Spoke with MD Megan Salon this am regarding intake this am; MD to discontinue calorie count at this time. Meals and Snacks: Cater to patient preferences; SLP following Medical Food Supplement Therapy: Continue as ordered, as pt drinking well   NUTRITION DIAGNOSIS:   Malnutrition related to acute illness as evidenced by percent weight loss, mild depletion of body fat, moderate depletions of muscle mass.  GOAL:   Patient will meet greater than or equal to 90% of their needs; ongoing  MONITOR:    (Energy Intake, Electrolyte and renal Profile, Anthropometrics, Digestive system)  REASON FOR ASSESSMENT:    (Pressure Ulcer)    ASSESSMENT:   Pt admitted with sepsis. Pt with h/o poorly differentiated sarcoma with mets to lung and brain with recent resection to frontal lob via craniotomy on 1/27. Pt discharged from [facility] 3 days PTA.  Palliative Care following, plan for pt to discharge SNF.  Diet Order:  DIET DYS 3 Room service appropriate?: Yes with Assist; Fluid consistency:: Thin    Current Nutrition: Pt ate 100% of french toast this am, 100% of banana, 100% of orange juice and was almost finished with Ensure on visit. Pt's wife reports pt did not want to eat this am until he was sat up in a chair at bedside. Wife also reported po intake is getting better.  Recorded po intake 45% on average.   Gastrointestinal Profile: Last BM: 09/16/2015   Scheduled Medications:  . aspirin  81 mg Oral Daily  . dexamethasone  4 mg Oral 3 times per day  . levETIRAcetam  500 mg Oral BID  . sodium chloride flush  3 mL Intravenous Q12H     Electrolyte/Renal Profile and Glucose Profile:   Recent Labs Lab 09/15/15 0622 09/16/15 0504  09/17/15 0430  NA 135 132* 131*  K 3.8 3.8 3.7  CL 104 98* 97*  CO2 '24 29 25  '$ BUN 22* 26* 30*  CREATININE 0.65 0.70 0.72  CALCIUM 8.2* 8.2* 8.2*  GLUCOSE 105* 123* 135*   Protein Profile:  Recent Labs Lab 09/14/15 2047 09/18/15 0530  ALBUMIN 2.5* 2.3*    Weight Trend since Admission: [facility] Weights   09/14/15 2043 09/14/15 2347  Weight: 159 lb 6.3 oz (72.3 kg) 152 lb 4.8 oz (69.083 kg)     Skin:   (stage II pressure ulcer sacral)   BMI:  Body mass index is 23.16 kg/(m^2).  Estimated Nutritional Needs:   Kcal:  BEE: 1329kcals, TEE: (IF 1.1-1.3)(AF 1.2) 1820-2150kcals  Protein:  76-90g protein (1.1-1.3g/kg)  Fluid:  1725-2043m of fluid (25-314mkg)  EDUCATION NEEDS:   No education needs identified at this time   MOBucksportRD, LDN Pager ([phone]/On-Call Pager [phone]

## 2015-09-19 NOTE — Progress Notes (Signed)
Pallaitive Care Update   NOTE  Pt is  DNR status now.  Awaiting approval for him to go back to [facility] for STR   WITH A PALLIATIVE CARE CONSULT WHILE GETTING STR  THEN     ---HE WOULD BE TRANSITIONED TO HOSPICE CARE IN SOME SETTING TO BE DETERMINED AT THAT TIME.   Colleen Can, MD

## 2015-09-20 NOTE — Progress Notes (Signed)
[place] at [place] NAME: Jorge Maynard    MR#:  [id number]  DATE OF BIRTH:  Jul 30, 1935  SUBJECTIVE:  Weakness, patient not very conversant Wife at bedside  REVIEW OF SYSTEMS:  CONSTITUTIONAL: No fever, fatigue positive weakness.  EYES: No blurred or double vision.  EARS, NOSE, AND THROAT: No tinnitus or ear pain.  RESPIRATORY: No cough, shortness of breath, wheezing or hemoptysis.  CARDIOVASCULAR: No chest pain, orthopnea, edema.  GASTROINTESTINAL: No nausea, vomiting, diarrhea or abdominal pain.  GENITOURINARY: No dysuria, hematuria.  ENDOCRINE: No polyuria, nocturia,  HEMATOLOGY: No anemia, easy bruising or bleeding SKIN: No rash or lesion. MUSCULOSKELETAL: No joint pain or arthritis.   NEUROLOGIC: No tingling, numbness, weakness.  PSYCHIATRY: No anxiety or depression.   DRUG ALLERGIES:  No Known Allergies  VITALS:  Blood pressure 102/63, pulse 77, temperature 97.9 F (36.6 C), temperature source Oral, resp. rate 18, height '5\' 8"'$  (1.727 m), weight 69.083 kg (152 lb 4.8 oz), SpO2 96 %.  PHYSICAL EXAMINATION:  VITAL SIGNS: Filed Vitals:   09/20/15 0602 09/20/15 1014  BP: 109/50 102/63  Pulse: 77 77  Temp: 97.4 F (36.3 C) 97.9 F (36.6 C)  Resp: 18    GENERAL:80 y.o.male currently in no acute distress.  HEAD: Normocephalic, atraumatic frontal craniotomy incision healing well.  EYES: Pupils equal, round, reactive to light. Extraocular muscles intact. No scleral icterus.  MOUTH: Moist mucosal membrane. Dentition intact. No abscess noted.  EAR, NOSE, THROAT: Clear without exudates. No external lesions.  NECK: Supple. No thyromegaly. No nodules. No JVD.  PULMONARY: Clear to ascultation, without wheeze rails or rhonci. No use of accessory muscles, Good respiratory effort. good air entry bilaterally CHEST: Nontender to palpation.  CARDIOVASCULAR: S1 and S2. Regular rate and rhythm. No murmurs, rubs, or gallops. No  edema. Pedal pulses 2+ bilaterally.  GASTROINTESTINAL: Soft, nontender, nondistended. No masses. Positive bowel sounds. No hepatosplenomegaly.  MUSCULOSKELETAL: No swelling, clubbing, or edema. Range of motion full in all extremities.  NEUROLOGIC: Cranial nerves II through XII are intact. Right-sided weakness SKIN: No ulceration, lesions, rashes, or cyanosis. Skin warm and dry. Turgor intact.  PSYCHIATRIC: Difficult to fully assess given patient's mental status medical condition Mood, affect blunted The patient is awake    LABORATORY PANEL:   CBC  Recent Labs Lab 09/17/15 0430  WBC 13.7*  HGB 11.7*  HCT 34.6*  PLT 218   ------------------------------------------------------------------------------------------------------------------  Chemistries   Recent Labs Lab 09/14/15 2047  09/17/15 0430  NA 134*  < > 131*  K 3.9  < > 3.7  CL 100*  < > 97*  CO2 24  < > 25  GLUCOSE 139*  < > 135*  BUN 28*  < > 30*  CREATININE 0.90  < > 0.72  CALCIUM 8.2*  < > 8.2*  AST 29  --   --   ALT 80*  --   --   ALKPHOS 121  --   --   BILITOT 0.6  --   --   < > = values in this interval not displayed. ------------------------------------------------------------------------------------------------------------------  Cardiac Enzymes No results for input(s): TROPONINI in the last 168 hours. ------------------------------------------------------------------------------------------------------------------  RADIOLOGY:  No results found.  EKG:   Orders placed or performed during the hospital encounter of 09/14/15  . EKG 12-Lead  . EKG 12-Lead    ASSESSMENT AND PLAN:   81 year old Caucasian gentleman admitted 09/14/15 code sepsis concern for healthcare associated pneumonia since that time ruled out.  1.  Acute stroke: Small bilateral infarcts  Neurology recommending continuing Decadron, Keppra, aspirin.  2.  Sepsis - Ruled out. lactic acid normal, hemodynamically stable, off ABX  3.  Essential (primary) hypertension - stable, continue home meds   4. Peripheral vascular disease ([place]) - continue home meds   5. S/P craniotomy - Incision seem to be healing well without signs or symptoms of infection at this time.   6.  Coronary artery disease involving native coronary artery of native heart without angina pectoris - continue home meds  7. GERD (gastroesophageal reflux disease) - home dose PPI  Disposition:STR/SNF with palliative care, await placement possible Monday   All the records are reviewed and case discussed with Care Management/Social Workerr. Management plans discussed with the patient, family and they are in agreement.  CODE STATUS: dnr  TOTAL TIME TAKING CARE OF THIS PATIENT: 28 minutes.   POSSIBLE D/C IN 1-2 DAYS, DEPENDING ON CLINICAL CONDITION.-   Yanixan Mellinger,  Karenann Cai.D on 09/20/2015 at 11:38 AM  Between 7am to 6pm - Pager - [phone]  After 6pm: House Pager: - [phone]  [facility]  Office  [phone]  CC: Primary care physician; Rusty Aus., MD

## 2015-09-21 MED ORDER — POLYETHYLENE GLYCOL 3350 17 G PO PACK
17.0000 g | PACK | Freq: Every day | ORAL | Status: DC
  Filled 2015-09-21: qty 1

## 2015-09-21 MED ORDER — DOCUSATE SODIUM 100 MG PO CAPS
100.0000 mg | ORAL_CAPSULE | Freq: Two times a day (BID) | ORAL | Status: DC
  Administered 2015-09-21 – 2015-09-22 (×2): 100 mg via ORAL
  Filled 2015-09-21 (×2): qty 1

## 2015-09-21 NOTE — Progress Notes (Signed)
[place] at [place] NAME: Jorge Maynard    MR#:  [id number]  DATE OF BIRTH:  1935-03-10  SUBJECTIVE:  Patient has no complaints at this time, wife present at bedside states that the patient hasn't had a bowel movement in about 3 days  REVIEW OF SYSTEMS:  CONSTITUTIONAL: No fever, fatigue or weakness.  EYES: No blurred or double vision.  EARS, NOSE, AND THROAT: No tinnitus or ear pain.  RESPIRATORY: No cough, shortness of breath, wheezing or hemoptysis.  CARDIOVASCULAR: No chest pain, orthopnea, edema.  GASTROINTESTINAL: No nausea, vomiting, diarrhea or abdominal pain.  GENITOURINARY: No dysuria, hematuria.  ENDOCRINE: No polyuria, nocturia,  HEMATOLOGY: No anemia, easy bruising or bleeding SKIN: No rash or lesion. MUSCULOSKELETAL: No joint pain or arthritis.   NEUROLOGIC: No tingling, numbness, weakness.  PSYCHIATRY: No anxiety or depression.   DRUG ALLERGIES:  No Known Allergies  VITALS:  Blood pressure 121/68, pulse 71, temperature 98 F (36.7 C), temperature source Oral, resp. rate 18, height '5\' 8"'$  (1.727 m), weight 69.083 kg (152 lb 4.8 oz), SpO2 96 %.  PHYSICAL EXAMINATION:  VITAL SIGNS: Filed Vitals:   09/20/15 1325 09/21/15 0555  BP: 113/57 121/68  Pulse: 101 71  Temp: 98 F (36.7 C)   Resp: 20 18   GENERAL:80 y.o.male currently in no acute distress.  HEAD: Normocephalic, atraumatic. Craniotomy incision well-healed EYES: Pupils equal, round, reactive to light. Extraocular muscles intact. No scleral icterus.  MOUTH: Moist mucosal membrane. Dentition intact. No abscess noted.  EAR, NOSE, THROAT: Clear without exudates. No external lesions.  NECK: Supple. No thyromegaly. No nodules. No JVD.  PULMONARY: Clear to ascultation, without wheeze rails or rhonci. No use of accessory muscles, Good respiratory effort. good air entry bilaterally CHEST: Nontender to palpation.  CARDIOVASCULAR: S1 and S2. Regular rate and  rhythm. No murmurs, rubs, or gallops. No edema. Pedal pulses 2+ bilaterally.  GASTROINTESTINAL: Soft, nontender, nondistended. No masses. Positive bowel sounds. No hepatosplenomegaly.  MUSCULOSKELETAL: No swelling, clubbing, or edema. Range of motion full in all extremities.  NEUROLOGIC: Cranial nerves II through XII are intact. No gross focal neurological deficits. Sensation intact. Reflexes intact. Weakness right side for lower extremity SKIN: No ulceration, lesions, rashes, or cyanosis. Skin warm and dry. Turgor intact.  PSYCHIATRIC: Mood, affect within normal limits. The patient is awake, alert and oriented x 3. Insight, judgment intact.      LABORATORY PANEL:   CBC  Recent Labs Lab 09/17/15 0430  WBC 13.7*  HGB 11.7*  HCT 34.6*  PLT 218   ------------------------------------------------------------------------------------------------------------------  Chemistries   Recent Labs Lab 09/14/15 2047  09/17/15 0430  NA 134*  < > 131*  K 3.9  < > 3.7  CL 100*  < > 97*  CO2 24  < > 25  GLUCOSE 139*  < > 135*  BUN 28*  < > 30*  CREATININE 0.90  < > 0.72  CALCIUM 8.2*  < > 8.2*  AST 29  --   --   ALT 80*  --   --   ALKPHOS 121  --   --   BILITOT 0.6  --   --   < > = values in this interval not displayed. ------------------------------------------------------------------------------------------------------------------  Cardiac Enzymes No results for input(s): TROPONINI in the last 168 hours. ------------------------------------------------------------------------------------------------------------------  RADIOLOGY:  No results found.  EKG:   Orders placed or performed during the hospital encounter of 09/14/15  . EKG 12-Lead  . EKG 12-Lead  ASSESSMENT AND PLAN:   81 year old Caucasian gentleman admitted 09/14/15 code sepsis concern for healthcare associated pneumonia since that time ruled out.  1. Acute stroke: Small bilateral infarcts  Neurology  recommending continuing Decadron, Keppra, aspirin.  2. Constipation other: Place on bowel regimen 2. Sepsis - Ruled out. lactic acid normal, hemodynamically stable, off ABX  3. Essential (primary) hypertension - stable, continue home meds   4. Peripheral vascular disease ([place]) - continue home meds   5. S/P craniotomy - Incision seem to be healing well without signs or symptoms of infection at this time.   6. Coronary artery disease involving native coronary artery of native heart without angina pectoris - continue home meds  7. GERD (gastroesophageal reflux disease) - home dose PPI  Disposition:STR/SNF with palliative care, await placement possible Monday     All the records are reviewed and case discussed with Care Management/Social Workerr. Management plans discussed with the patient, family and they are in agreement.  CODE STATUS: DO NOT RESUSCITATE  TOTAL TIME TAKING CARE OF THIS PATIENT: 28 minutes.   POSSIBLE D/C IN 1 DAYS, DEPENDING ON CLINICAL CONDITION.   Hower,  Karenann Cai.D on 09/21/2015 at 10:55 AM  Between 7am to 6pm - Pager - [phone]  After 6pm: House Pager: - [phone]  [facility]  Office  [phone]  CC: Primary care physician; Rusty Aus., MD

## 2015-09-21 NOTE — Evaluation (Signed)
Occupational Therapy Evaluation Patient Details Name: Jorge Maynard MRN: [id number] DOB: Dec 24, 1934 Today's Date: 09/21/2015    History of Present Illness Patient is a 81 y/o male that recently underwent L frontotemporal craniotomy at [facility] for tumor resection. He has previously undergone stereotatic radiation for lung mets. He developed some confusion and non-productive cough while at SNF.    Clinical Impression   Pt is 81 year old male s/p L frontotemporal craniotomy at [facility] for tumor resection.  He had previously undergone sterotatic radiation for lung mets.  He was seen for OT evaluation with his wife, Jorge Maynard, and son present as well as 2 friends.  He was alert and attempted to communicate but presents with expressive aphasia.  He is able to participate in basic ADLs such as washing his face and brushing his teeth using his R hand with cues and tactile input and hand over hand to initiate task.  He followed one step commands 50% of the time. He requires mod assist for UB dressing and max assist for LB dressing.  Family education and training to allow pt to complete ADLs as much as possible with tactile input as needed.  Given foam utensil holder to use for self feeding and rec he use his R hand as much as possible to help with automaticity.He was able to hold a pen to attempt to write his name but was not legible.  Rec continued OT for ADL training, functional mobility training, neuromuscular re-ed, fine motor training and cognitive re-ed.  He would benefit from continued rehab at [facility].    Follow Up Recommendations  SNF    Equipment Recommendations       Recommendations for Other Services       Precautions / Restrictions Precautions Precautions: Fall Restrictions Weight Bearing Restrictions: No      Mobility Bed Mobility                  Transfers                      Balance                                            ADL  Overall ADL's : Needs assistance/impaired                 Upper Body Dressing : Sitting;Moderate assistance   Lower Body Dressing: Sit to/from stand;Maximal assistance;+2 for physical assistance               Functional mobility during ADLs: Moderate assistance;+2 for physical assistance General ADL Comments: Pt is able to follow one step commands 50% of the time with improved follow through with automatic tasks and gestures with tactile cues and hand over hand to initaite task.  Min assist for grooming skills and able to use R hand to brush teeth but needed tactile cues to initiate brushing vs just chewing on toothbrush.  Mod cues to wash face with hand over hand to initiate task.        Vision     Perception     Praxis      Pertinent Vitals/Pain Pain Assessment: No/denies pain     Hand Dominance Right   Extremity/Trunk Assessment Upper Extremity Assessment Upper Extremity Assessment: Generalized weakness;RUE deficits/detail RUE Deficits / Details: old rotator cuff tear in RUE with decreased  rotation but [facility] for flexion and use during ADLs RUE Sensation:  (unable to assess due to impaired cognition and decreased follow through of one step commands ) RUE Coordination: decreased fine motor;decreased gross motor LUE Deficits / Details: generalized weakness in shoulder flexors       Cervical / Trunk Assessment Cervical / Trunk Assessment: Kyphotic   Communication Communication Communication: Expressive difficulties;Receptive difficulties   Cognition Arousal/Alertness: Awake/alert Behavior During Therapy: Flat affect Overall Cognitive Status: Impaired/Different from baseline Area of Impairment: Following commands;Problem solving;Safety/judgement;Memory;Orientation     Memory: Decreased short-term memory Following Commands: Follows one step commands inconsistently Safety/Judgement: Decreased awareness of safety;Decreased awareness of deficits   Problem  Solving: Slow processing;Decreased initiation;Difficulty sequencing;Requires verbal cues     General Comments       Exercises       Shoulder Instructions      Home Living Family/patient expects to be discharged to:: Skilled nursing facility Living Arrangements: Other (Comment) Available Help at Discharge: Family;Available 24 hours/day Type of Home: House Home Access: Stairs to enter [employer] of Steps: 3 Entrance Stairs-Rails: None Home Layout: Two level;Able to live on main level with bedroom/bathroom Alternate Level Stairs-Number of Steps: flight   Bathroom Shower/Tub: Occupational psychologist: Standard     Home Equipment: Shower seat - built in   Additional Comments: wife was an Therapist, sports, pt has Engineer, production background      Prior Functioning/Environment Level of Independence: Independent             OT Diagnosis: Generalized weakness;Cognitive deficits;Hemiplegia dominant side   OT Problem List: Decreased strength;Decreased coordination;Impaired balance (sitting and/or standing);Decreased knowledge of use of DME or AE;Decreased knowledge of precautions;Impaired UE functional use;Decreased cognition;Decreased safety awareness   OT Treatment/Interventions: Self-care/ADL training;DME and/or AE instruction;Therapeutic activities;Patient/family education;Balance training;Cognitive remediation/compensation;Therapeutic exercise    OT Goals(Current goals can be found in the care plan section) Acute Rehab OT Goals Patient Stated Goal: return home OT Goal Formulation: With patient/family Time For Goal Achievement: 10/05/15 Potential to Achieve Goals: Good ADL Goals Pt Will Perform Eating: with adaptive utensils;sitting;with set-up (with foam utensil holder) Pt Will Perform Grooming: with set-up;with supervision;sitting (using R hand for brushing teeth ) Pt Will Perform Upper Body Dressing: with min assist;sitting Pt Will Perform Lower Body Dressing: with  mod assist;sit to/from stand  OT Frequency: Min 1X/week   Barriers to D/C:            Co-evaluation              End of Session Nurse Communication:  (foam utensil holder for self feeding)  Activity Tolerance: Patient tolerated treatment well Patient left: in bed;with call bell/phone within reach;with bed alarm set;with family/visitor present   Time: 1100-1130 OT Time Calculation (min): 30 min Charges:  OT General Charges $OT Visit: 1 Procedure OT Evaluation $OT Eval Moderate Complexity: 1 Procedure OT Treatments $Self Care/Home Management : 8-22 mins G-Codes:    Adonis Yim 10/31/2015, 12:31 PM   Chrys Racer, OTR/L ascom [phone]

## 2015-09-21 NOTE — Consult Note (Signed)
WOC wound consult note Reason for Consult: full thickness open area in the perianal area; intertriginous dermatitis in the gluteal crease Wound type:Moisture and moisture plus friction Pressure Ulcer POA: No Measurement:2cm x 0.4cm x 0.2cm Wound [id number], pink, moist Drainage (amount, consistency, odor) scant serous Periwound:intact, erythematous.  Erythematous in the gluteal crease Dressing procedure/placement/frequency: Patient incontinent of both urine and stool; consequences to skin by incontinence is magnified by the fact that patient is wearing an incontinent brief for containment creating a warm and dark environment.  I suggest maintaining a primarily side to side positioning schedule except for meals and to use a clear zinc skin barrier ointment (no diaper) for incontinence. I have also provided a pressure redistribution chair pad for the chair as the patient's wife reports that he is uncomfortable sitting up for the recommended periods of time due to the firmness of the chair. [place] nursing team will not follow, but will remain available to this patient, the nursing and medical teams.  Please re-consult if needed. Thanks, Maudie Flakes, MSN, RN, [place], Arther Abbott  Pager# [phone]

## 2015-09-22 ENCOUNTER — Encounter
Admission: RE | Admit: 2015-09-22 | Discharge: 2015-09-22 | Disposition: A | Discharge status: Home / Self Care | Payer: Medicare PPO | Source: Ambulatory Visit | Attending: Internal Medicine | Admitting: Internal Medicine

## 2015-09-22 DIAGNOSIS — C7931 Secondary malignant neoplasm of brain: Secondary | ICD-10-CM

## 2015-09-22 DIAGNOSIS — I10 Essential (primary) hypertension: Secondary | ICD-10-CM

## 2015-09-22 DIAGNOSIS — N400 Enlarged prostate without lower urinary tract symptoms: Secondary | ICD-10-CM

## 2015-09-22 DIAGNOSIS — I2510 Atherosclerotic heart disease of native coronary artery without angina pectoris: Secondary | ICD-10-CM

## 2015-09-22 DIAGNOSIS — R51 Headache: Secondary | ICD-10-CM

## 2015-09-22 DIAGNOSIS — I639 Cerebral infarction, unspecified: Secondary | ICD-10-CM

## 2015-09-22 DIAGNOSIS — G8191 Hemiplegia, unspecified affecting right dominant side: Secondary | ICD-10-CM

## 2015-09-22 LAB — EXPECTORATED SPUTUM ASSESSMENT W GRAM STAIN, RFLX TO RESP C

## 2015-09-22 LAB — EXPECTORATED SPUTUM ASSESSMENT W REFEX TO RESP CULTURE

## 2015-09-22 MED ORDER — MORPHINE SULFATE (CONCENTRATE) 10 MG /0.5 ML PO SOLN: 5.0000 mg | ORAL | Status: DC | PRN

## 2015-09-22 MED ORDER — BISACODYL 10 MG RE SUPP: 10.0000 mg | Freq: Every day | RECTAL | Status: AC | PRN

## 2015-09-22 MED ORDER — MORPHINE SULFATE (CONCENTRATE) 10 MG /0.5 ML PO SOLN: 5.0000 mg | ORAL | Status: AC | PRN

## 2015-09-22 MED ORDER — LORAZEPAM 0.5 MG PO TABS: 0.5000 mg | ORAL_TABLET | Freq: Three times a day (TID) | ORAL | Status: AC | PRN

## 2015-09-22 MED ORDER — LEVETIRACETAM 100 MG/ML PO SOLN: 500.0000 mg | Freq: Two times a day (BID) | ORAL | Status: AC

## 2015-09-22 MED ORDER — TRAMADOL HCL 50 MG PO TABS: 50.0000 mg | ORAL_TABLET | Freq: Four times a day (QID) | ORAL | Status: AC | PRN

## 2015-09-22 MED ORDER — GUAIFENESIN ER 600 MG PO TB12
600.0000 mg | ORAL_TABLET | Freq: Two times a day (BID) | ORAL | Status: DC
  Administered 2015-09-22: 600 mg via ORAL
  Filled 2015-09-22: qty 1

## 2015-09-22 MED ORDER — LORAZEPAM 0.5 MG PO TABS: 0.5000 mg | ORAL_TABLET | Freq: Three times a day (TID) | ORAL | Status: DC | PRN

## 2015-09-22 MED ORDER — GUAIFENESIN ER 600 MG PO TB12: 600.0000 mg | ORAL_TABLET | Freq: Two times a day (BID) | ORAL | Status: AC

## 2015-09-22 NOTE — Care Management Important Message (Signed)
Important Message  Patient Details  Name: Jorge Maynard MRN: [id number] Date of Birth: [birth year]   Medicare Important Message Given:  Yes    Juliann Pulse A Bera Pinela 09/22/2015, 9:43 AM

## 2015-09-22 NOTE — Discharge Summary (Signed)
[place] [facility] [place] NAME: Jorge Maynard    MR#:  [id number]  DATE OF BIRTH:  20-Aug-1934  DATE OF ADMISSION:  09/14/2015 ADMITTING PHYSICIAN: Lance Coon, MD  DATE OF DISCHARGE: No discharge date for patient encounter.  PRIMARY CARE PHYSICIAN: Rusty Aus., MD    ADMISSION DIAGNOSIS:  Healthcare associated pneumonia Sepsis  DISCHARGE DIAGNOSIS:  Sepsis ruled out Healthcare associated pneumonia ruled out Acute ischemic stroke  Scattered small acute infarcts in the right ACA, right ACA/ MCA watershed territory. No  associated hemorrhage or mass effect. Worsening rt > lt subdural hematomas since the  postoperative scan. Increased rightward midline shift of 4-5 mm. Also has Stable trace  subarachnoid hemorrhage.  SECONDARY DIAGNOSIS:   Past Medical History  Diagnosis Date  . 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 Hypertension   . Coronary artery disease   . Peripheral vascular disease ([place])   . GERD (gastroesophageal reflux disease)   . Gout   . Aortic stenosis   . Carotid stenosis   . Pathologic myopia, both eyes   . Posterior staphyloma   . 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 Malignant neoplasm of skin   . Memory changes   . Enlarged prostate   . Arthritis   . Complication of anesthesia   . PONV (postoperative nausea and vomiting)   . Aortic stenosis     moderate on 12/2013 echo    HOSPITAL COURSE:  Jorge Maynard  is a 81 y.o. male admitted 09/14/2015 with chief complaint cough and altered mental status. Please see H&P performed by Dr. Jannifer Franklin for further information. The patient originally sent to the hospital cough and confusion. Meeting septic criteria on arrival with concern for healthcare associated pneumonia. Started on broad-spectrum antibiotics however, symptoms improved and healthcare acquired pneumonia deemed unlikely antibiotics discontinued. Patient's mental status declined once again on 09/16/2015 MRI obtained  which revealed Scattered small acute infarcts in the right ACA, right ACA/ MCA watershed territory. No associated hemorrhage or mass effect. Worsening rt > lt subdural hematomas since the postoperative scan. Increased rightward midline shift of 4-5 mm. Also has Stable trace subarachnoid hemorrhage. Neurology and provide care consult at that time. Increased dosages of steroids. He continued to work with physical therapy for the duration of course. With some improvement however not at baseline.  DISCHARGE CONDITIONS:   Stable  CONSULTS OBTAINED:  Treatment Team:  Forest Gleason, MD Catarina Hartshorn, MD  DRUG ALLERGIES:  No Known Allergies  DISCHARGE MEDICATIONS:   Current Discharge Medication List    START taking these medications   Details  aspirin 81 MG chewable tablet Chew 1 tablet (81 mg total) by mouth daily.    bisacodyl (BISACODYL) 5 MG EC tablet Take 1 tablet (5 mg total) by mouth daily as needed for mild constipation or moderate constipation. Qty: 15 tablet, Refills: 0    bisacodyl (DULCOLAX) 10 MG suppository Place 1 suppository (10 mg total) rectally daily as needed for moderate constipation. Qty: 12 suppository, Refills: 0    guaiFENesin (MUCINEX) 600 MG 12 hr tablet Take 1 tablet (600 mg total) by mouth 2 (two) times daily. Qty: 30 tablet, Refills: 0    ipratropium-albuterol (DUONEB) 0.5-2.5 (3) MG/3ML SOLN Take 3 mLs by nebulization every 2 (two) hours as needed. Qty: 120 mL, Refills: 0    levETIRAcetam (KEPPRA) 100 MG/ML solution Take 5 mLs (500 mg total) by mouth 2 (two) times daily. Qty: 473 mL, Refills: 12  LORazepam (ATIVAN) 0.5 MG tablet Take 1 tablet (0.5 mg total) by mouth every 8 (eight) hours as needed for anxiety. Qty: 15 tablet, Refills: 0    Morphine Sulfate (MORPHINE CONCENTRATE) 10 mg / 0.5 ml concentrated solution Take 0.25 mLs (5 mg total) by mouth every 2 (two) hours as needed for moderate pain, severe pain or shortness of breath. Qty: 30 mL,  Refills: 0    traMADol (ULTRAM) 50 MG tablet Take 1 tablet (50 mg total) by mouth every 6 (six) hours as needed (headache). Qty: 30 tablet, Refills: 0      CONTINUE these medications which have CHANGED   Details  dexamethasone (DECADRON) 4 MG tablet Take 1 tablet (4 mg total) by mouth every 8 (eight) hours. Qty: 30 tablet, Refills: 0      STOP taking these medications     allopurinol (ZYLOPRIM) 300 MG tablet      amLODipine (NORVASC) 10 MG tablet      aspirin 81 MG tablet      atorvastatin (LIPITOR) 80 MG tablet      carvedilol (COREG) 6.25 MG tablet      chlorthalidone (HYGROTON) 25 MG tablet      Esomeprazole Magnesium (NEXIUM 24HR PO)      ibuprofen (ADVIL,MOTRIN) 200 MG tablet      levETIRAcetam (KEPPRA) 500 MG tablet      pseudoephedrine-guaifenesin (MUCINEX D) 60-600 MG 12 hr tablet      ramipril (ALTACE) 10 MG capsule      clopidogrel (PLAVIX) 75 MG tablet          DISCHARGE INSTRUCTIONS:    DIET:  Cardiac diet  DISCHARGE CONDITION:  Stable  ACTIVITY:  Activity as tolerated  OXYGEN:  Home Oxygen: No.   Oxygen Delivery: room air  DISCHARGE LOCATION:  nursing home   If you experience worsening of your admission symptoms, develop shortness of breath, life threatening emergency, suicidal or homicidal thoughts you must seek medical attention immediately by calling 911 or calling your MD immediately  if symptoms less severe.  You Must read complete instructions/literature along with all the possible adverse reactions/side effects for all the Medicines you take and that have been prescribed to you. Take any new Medicines after you have completely understood and accpet all the possible adverse reactions/side effects.   Please note  You were cared for by a hospitalist during your hospital stay. If you have any questions about your discharge medications or the care you received while you were in the hospital after you are discharged, you can call the  unit and asked to speak with the hospitalist on call if the hospitalist that took care of you is not available. Once you are discharged, your primary care physician will handle any further medical issues. Please note that NO REFILLS for any discharge medications will be authorized once you are discharged, as it is imperative that you return to your primary care physician (or establish a relationship with a primary care physician if you do not have one) for your aftercare needs so that they can reassess your need for medications and monitor your lab values.    On the day of Discharge:   VITAL SIGNS:  Blood pressure 102/77, pulse 77, temperature 97.4 F (36.3 C), temperature source Oral, resp. rate 18, height '5\' 8"'$  (1.727 m), weight 69.083 kg (152 lb 4.8 oz), SpO2 97 %.  I/O:   Intake/Output Summary (Last 24 hours) at 09/22/15 1303 Last data filed at 09/22/15 0800  Gross  per 24 hour  Intake    360 ml  Output      0 ml  Net    360 ml    PHYSICAL EXAMINATION:  GENERAL:  81 y.o.-year-old patient lying in the bed with no acute distress.  EYES: Pupils equal, round, reactive to light and accommodation. No scleral icterus. Extraocular muscles intact.  HEENT: Head atraumatic, normocephalic. Oropharynx and nasopharynx clear.  NECK:  Supple, no jugular venous distention. No thyroid enlargement, no tenderness.  LUNGS: Normal breath sounds bilaterally, no wheezing, rales,rhonchi or crepitation. No use of accessory muscles of respiration.  CARDIOVASCULAR: S1, S2 normal. No murmurs, rubs, or gallops.  ABDOMEN: Soft, non-tender, non-distended. Bowel sounds present. No organomegaly or mass.  EXTREMITIES: No pedal edema, cyanosis, or clubbing.  NEUROLOGIC: Cranial nerves II through XII are intact. Muscle strength 5/5 in all extremities. Sensation intact. Gait not checked.  PSYCHIATRIC: The patient is alert and oriented x 3.  SKIN: No obvious rash, lesion, or ulcer.   DATA REVIEW:   CBC  Recent  Labs Lab 09/17/15 0430  WBC 13.7*  HGB 11.7*  HCT 34.6*  PLT 218    Chemistries   Recent Labs Lab 09/17/15 0430  NA 131*  K 3.7  CL 97*  CO2 25  GLUCOSE 135*  BUN 30*  CREATININE 0.72  CALCIUM 8.2*    Cardiac Enzymes No results for input(s): TROPONINI in the last 168 hours.  Microbiology Results  Results for orders placed or performed during the hospital encounter of 09/14/15  Blood Culture (routine x 2)     Status: None   Collection Time: 09/14/15  8:45 PM  Result Value Ref Range Status   Specimen Description BLOOD RIGHT ASSIST CONTROL  Final   Special Requests   Final    BOTTLES DRAWN AEROBIC AND ANAEROBIC  6CC AERO, 5CC ANA   Culture NO GROWTH 5 DAYS  Final   Report Status 09/19/2015 FINAL  Final  Urine culture     Status: None   Collection Time: 09/14/15  8:45 PM  Result Value Ref Range Status   Specimen Description URINE, RANDOM  Final   Special Requests NONE  Final   Culture NO GROWTH 1 DAY  Final   Report Status 09/16/2015 FINAL  Final  Blood Culture (routine x 2)     Status: None   Collection Time: 09/14/15  8:46 PM  Result Value Ref Range Status   Specimen Description BLOOD LEFT HAND  Final   Special Requests   Final    BOTTLES DRAWN AEROBIC AND ANAEROBIC  4CC AERO, 3CC ANA   Culture NO GROWTH 5 DAYS  Final   Report Status 09/19/2015 FINAL  Final  Culture, sputum-assessment     Status: None   Collection Time: 09/15/15  9:48 AM  Result Value Ref Range Status   Specimen Description EXPECTORATED SPUTUM  Final   Special Requests NONE  Final   Sputum evaluation THIS SPECIMEN IS ACCEPTABLE FOR SPUTUM CULTURE  Final   Report Status 09/15/2015 FINAL  Final  Culture, respiratory (NON-Expectorated)     Status: None   Collection Time: 09/15/15  9:48 AM  Result Value Ref Range Status   Specimen Description EXPECTORATED SPUTUM  Final   Special Requests NONE Reflexed from [id number]  Final   Gram Stain   Final    GOOD SPECIMEN - 80-90% WBCS FEW SQUAMOUS  EPITHELIAL CELLS PRESENT MODERATE WBC SEEN FEW GRAM POSITIVE COCCI IN PAIRS IN CLUSTERS FEW GRAM NEGATIVE COCCOBACILLI RARE GRAM  NEGATIVE RODS RARE GRAM POSITIVE RODS    Culture LIGHT GROWTH ESCHERICHIA COLI  Final   Report Status 09/17/2015 FINAL  Final   Organism ID, Bacteria ESCHERICHIA COLI  Final      Susceptibility   Escherichia coli - MIC*    AMPICILLIN >=32 RESISTANT Resistant     CEFAZOLIN 8 SENSITIVE Sensitive     CEFEPIME <=1 SENSITIVE Sensitive     CEFTAZIDIME <=1 SENSITIVE Sensitive     CEFTRIAXONE <=1 SENSITIVE Sensitive     CIPROFLOXACIN <=0.25 SENSITIVE Sensitive     GENTAMICIN <=1 SENSITIVE Sensitive     IMIPENEM <=0.25 SENSITIVE Sensitive     TRIMETH/SULFA <=20 SENSITIVE Sensitive     AMPICILLIN/SULBACTAM >=32 RESISTANT Resistant     PIP/TAZO <=4 SENSITIVE Sensitive     Extended ESBL NEGATIVE Sensitive     * LIGHT GROWTH ESCHERICHIA COLI    RADIOLOGY:  No results found.   Management plans discussed with the patient, family and they are in agreement.  CODE STATUS:     Code Status Orders        Start     Ordered   09/18/15 1511  Do not attempt resuscitation (DNR)   Continuous    Question Answer Comment  In the event of cardiac or respiratory ARREST Do not call a "code blue"   In the event of cardiac or respiratory ARREST Do not perform Intubation, CPR, defibrillation or ACLS   In the event of cardiac or respiratory ARREST Use medication by any route, position, wound care, and other measures to relive pain and suffering. May use oxygen, suction and manual treatment of airway obstruction as needed for comfort.      09/18/15 1510    Code Status History    Date Active Date Inactive Code Status Order ID Comments User Context   09/14/2015 11:47 PM 09/18/2015  3:10 PM Full Code [id number]  Lance Coon, MD Inpatient   09/05/2015  8:54 PM 09/12/2015  7:55 PM Full Code [id number]  Consuella Lose, MD Inpatient    Advance Directive Documentation        Most  Recent Value   Type of Advance Directive  Healthcare Power of Attorney   Pre-existing out of facility DNR order (yellow form or pink MOST form)     "MOST" Form in Place?        TOTAL TIME TAKING CARE OF THIS PATIENT: 33 minutes.    Sandro Burgo,  Karenann Cai.D on 09/22/2015 at 1:03 PM  Between 7am to 6pm - Pager - [phone]  After 6pm go to [email or web address] - password [email or web address] [facility]  [facility]  Office  [phone]  CC: Primary care physician; Rusty Aus., MD

## 2015-09-22 NOTE — Progress Notes (Signed)
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

## 2015-09-22 NOTE — Progress Notes (Signed)
[place] at [place] NAME: Jorge Maynard    MR#:  [id number]  DATE OF BIRTH:  02-10-1935  SUBJECTIVE:  Patient has no complaints at this time, wife present at bedside Patient had bowel movement yesterday Speech is improving right-sided weakness stable  REVIEW OF SYSTEMS:  CONSTITUTIONAL: No fever, fatigue or weakness.  EYES: No blurred or double vision.  EARS, NOSE, AND THROAT: No tinnitus or ear pain.  RESPIRATORY: Positive cough, denies shortness of breath, wheezing or hemoptysis.  CARDIOVASCULAR: No chest pain, orthopnea, edema.  GASTROINTESTINAL: No nausea, vomiting, diarrhea or abdominal pain.  GENITOURINARY: No dysuria, hematuria.  ENDOCRINE: No polyuria, nocturia,  HEMATOLOGY: No anemia, easy bruising or bleeding SKIN: No rash or lesion. MUSCULOSKELETAL: No joint pain or arthritis.   NEUROLOGIC: No tingling, numbness, weakness.  PSYCHIATRY: No anxiety or depression.   DRUG ALLERGIES:  No Known Allergies  VITALS:  Blood pressure 102/77, pulse 77, temperature 97.4 F (36.3 C), temperature source Oral, resp. rate 18, height '5\' 8"'$  (1.727 m), weight 69.083 kg (152 lb 4.8 oz), SpO2 97 %.  PHYSICAL EXAMINATION:  VITAL SIGNS: Filed Vitals:   09/21/15 2114 09/22/15 0502  BP: 119/52 102/77  Pulse: 66 77  Temp: 97.7 F (36.5 C) 97.4 F (36.3 C)  Resp: 18 18   GENERAL:81 y.o.male currently in no acute distress.  HEAD: Normocephalic, atraumatic. Craniotomy incision well-healed EYES: Pupils equal, round, reactive to light. Extraocular muscles intact. No scleral icterus.  MOUTH: Moist mucosal membrane. Dentition intact. No abscess noted.  EAR, NOSE, THROAT: Clear without exudates. No external lesions.  NECK: Supple. No thyromegaly. No nodules. No JVD.  PULMONARY: Scant Right basilar rhonchi without wheeze  No use of accessory muscles, Good respiratory effort. good air entry bilaterally CHEST: Nontender to palpation.   CARDIOVASCULAR: S1 and S2. Regular rate and rhythm. No murmurs, rubs, or gallops. No edema. Pedal pulses 2+ bilaterally.  GASTROINTESTINAL: Soft, nontender, nondistended. No masses. Positive bowel sounds. No hepatosplenomegaly.  MUSCULOSKELETAL: No swelling, clubbing, or edema. Range of motion full in all extremities.  NEUROLOGIC: Cranial nerves II through XII are intact. No gross focal neurological deficits. Sensation intact. Reflexes intact. Weakness right side for lower extremity SKIN: No ulceration, lesions, rashes, or cyanosis. Skin warm and dry. Turgor intact.  PSYCHIATRIC: Mood, affect within normal limits. The patient is awake, alert and oriented x 3. Insight, judgment intact.      LABORATORY PANEL:   CBC  Recent Labs Lab 09/17/15 0430  WBC 13.7*  HGB 11.7*  HCT 34.6*  PLT 218   ------------------------------------------------------------------------------------------------------------------  Chemistries   Recent Labs Lab 09/17/15 0430  NA 131*  K 3.7  CL 97*  CO2 25  GLUCOSE 135*  BUN 30*  CREATININE 0.72  CALCIUM 8.2*   ------------------------------------------------------------------------------------------------------------------  Cardiac Enzymes No results for input(s): TROPONINI in the last 168 hours. ------------------------------------------------------------------------------------------------------------------  RADIOLOGY:  No results found.  EKG:   Orders placed or performed during the hospital encounter of 09/14/15  . EKG 12-Lead  . EKG 12-Lead    ASSESSMENT AND PLAN:   81 year old Caucasian gentleman admitted 09/14/15 code sepsis concern for healthcare associated pneumonia since that time ruled out.  1. Acute stroke: Small bilateral infarcts  Neurology recommending continuing Decadron, Keppra, aspirin.  2. Constipation other: Placed on bowel regimen - resolved 2. Sepsis - Ruled out. lactic acid normal, hemodynamically stable, off  ABX  3. Essential (primary) hypertension - stable, continue home meds   4. Peripheral vascular disease (  [place]) - continue home meds   5. S/P craniotomy - Incision seem to be healing well without signs or symptoms of infection at this time.   6. Coronary artery disease involving native coronary artery of native heart without angina pectoris - continue home meds  7. GERD (gastroesophageal reflux disease) - home dose PPI  Disposition:STR/SNF with palliative care Placement     All the records are reviewed and case discussed with Care Management/Social Workerr. Management plans discussed with the patient, family and they are in agreement.  CODE STATUS: DO NOT RESUSCITATE  TOTAL TIME TAKING CARE OF THIS PATIENT: 28 minutes.   POSSIBLE D/C IN 1 DAYS, DEPENDING ON CLINICAL CONDITION.   Hower,  Karenann Cai.D on 09/22/2015 at 12:34 PM  Between 7am to 6pm - Pager - [phone]  After 6pm: House Pager: - [facility]  Office  [phone]  CC: Primary care physician; Rusty Aus., MD

## 2015-09-22 NOTE — Progress Notes (Signed)
Palliative Medicine Inpatient Consult Follow Up Note   Name: Jorge Maynard Old Date: 09/22/2015 MRN: [id number]  DOB: 06-25-1935  Referring Physician: Lytle Butte, MD  Palliative Care consult requested for this 81 y.o. male for goals of medical therapy in patient with metastatic sarcoma.   PLAN: Pt is appropriate for short term rehab. He has potential to improve in terms of helping with transfers and assisting with ADLs --which will improve his quality of life and also help reduce care-giver burden.  He will probably not get back to baseline and as his cancer progresses, he will inevitably decline. But, we don't know the timeline for that decline, and he would benefit in the meantime from improved functional status.  He is working with therapy.  That said, at some point in the near future, he should be transitioned over to being under Hospice care.  He is Hospice eligible NOW as he is thought to have less than 6 months to live. But, he should have rehab for now and while he is getting therapy, he should also have a [place]. This consultant will help to transition pt to Hospice care when the time is right.   Meds were done for DC by me on Friday.  He is to continue on Decadron as well as a few treatment meds and moslty comfort meds. Meds should be minimal --so he can have room in his stomach for food. He has done well eating recently.  He does get headaches and these should be treated. He can't always say what his needs are, unfortunately.  Monitor for grimacing or other symptoms of a headache.        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   After wedge resection he was followed by Dr. Oliva Bustard, oncologist, and he was found in Oct 2016 to have an enlarging right upper lobe mass that was separate from the original tumor and PET positive. He underwent Radiation Therapy for this right lung metastatic lesion. He began to have significant exertional dyspnea in January. He developed slurred speech and headaches and was admitted on Jan 27 th to [facility] for craniotomy and resection of a large left frontal tumor. He had developed mild right hemiparesis. He then went to [facility] for rehab.   Three days later, on Feb 5th, he was admitted here due to an increased cough. It was thought he might have a pneumonia but a CT scan showed no infiltrates. Procalcitonin was low and the plan was to send him back to the SNF for continued rehab.   But, he had an MRI of his brain today which showed findings that are changing that plan. The MRI showed the following: 1. Scattered small acute infarcts in the right ACA, right ACA/ MCA watershed territory. No associated hemorrhage or mass effect. 2. Progressed broad-based right greater than left subdural hematomas since the postoperative scan. Increased rightward midline shift of 4-5 mm. 3. Regional left anterior frontal lobe postoperative complex extra-axial collection has not significantly changed but may communicate with the scalp soft tissues via craniotomy. Stable trace subarachnoid hemorrhage. Regressed trace intraventricular hemorrhage. No  ventriculomegaly. 4. Development of rim enhancement along the resection cavity, but favor related to postoperative ischemia.  IMPRESSION Sarcoma ---likely source was right temporal lesion c/w atypical fibroxanthoma -treated with [facility] in 2011) -- with met to right upper lung in 2015 treated in 2015 with resection of right upper lung mass and then he had a new right upper lung nodule treated with  radiation. Then he developed a brain met and had a craniotomy to resect this. Now, he is worsened.  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 Sarcoma ([place]) 12/31/2014  . Hypertension   . Coronary artery disease   . Peripheral vascular disease ([place])   . GERD (gastroesophageal reflux disease)   . Gout   . Aortic stenosis   . Carotid stenosis   . Pathologic myopia, both eyes   . Posterior staphyloma   . Secondary sarcoma of right lung ([place])   . Malignant neoplasm of skin   . Memory changes   . Enlarged prostate   . Arthritis   . Complication of anesthesia   . PONV (postoperative nausea and vomiting)   . Aortic stenosis     moderate on 12/2013 echo    PAST SURGICAL HISTORY:  Past Surgical History  Procedure Laterality Date  . Colonoscopy N/A 01/07/2015    Procedure: COLONOSCOPY;  Surgeon: Lollie Sails, MD;  Location: [facility] ENDOSCOPY;  Service: Endoscopy;  Laterality: N/A;  . Coronary angioplasty      stents - 2 different times  . Eye surgery Bilateral     cataract surgery with lens implant  . Lung cancer surgery      2015   . Rotator cuff repair Right   . Fracture surgery    . Tendon repair Left     elbow  . Vasectomy    . Craniotomy Right 09/05/2015    Procedure: Stereotactic Right Craniotomy for resection of tumor with BrainLab;  Surgeon: Consuella Lose, MD;  Location: [place] NEURO ORS;  Service: Neurosurgery;  Laterality: Right;  Stereotactic Right Craniotomy for resection of tumor with BrainLab  . Application of cranial navigation N/A 09/05/2015    Procedure: APPLICATION OF CRANIAL NAVIGATION;  Surgeon: Consuella Lose, MD;  Location: [place] NEURO ORS;  Service: Neurosurgery;  Laterality: N/A;    Vital  Signs: BP 114/61 mmHg  Pulse 65  Temp(Src) 97.8 F (36.6 C) (Oral)  Resp 20  Ht _0  (1.727 m)  Wt 69.083 kg (152 lb 4.8 oz)  BMI 23.16 kg/m2  SpO2 93% Filed Weights   09/14/15 2043 09/14/15 2347  Weight: 72.3 kg (159 lb 6.3 oz) 69.083 kg (152 lb 4.8 oz)    Estimated body mass index is 23.16 kg/(m^2) as calculated from the following:   Height as of this encounter: _1  (1.727 m).   Weight as of this encounter: 69.083 kg (152 lb 4.8 oz).  PHYSICAL EXAM: Lying in bed, able to say 'hello' and focus and follow some instructions Did well with OT and now PT is in the room. EOMI OP clear Craniotomy scar is prominent in the sunlight coming into the room NO JVD or TM Hrt rrr no m Lungs cta no rales or rattles Abd soft and NT Ext no cc Skin warm and  dry LABS: CBC:    Component Value Date/Time   WBC 13.7* 09/17/2015 0430   WBC 10.2 09/05/2014 1058   HGB 11.7* 09/17/2015 0430   HGB 13.7 09/05/2014 1058   HCT 34.6* 09/17/2015 0430   HCT 40.6 09/05/2014 1058   PLT 218 09/17/2015 0430   PLT 238 09/05/2014 1058   MCV 89.9 09/17/2015 0430   MCV 91 09/05/2014 1058   NEUTROABS 12.1* 09/14/2015 2047   NEUTROABS 6.5 09/05/2014 1058   LYMPHSABS 0.7* 09/14/2015 2047   LYMPHSABS 2.2 09/05/2014 1058   MONOABS 0.6 09/14/2015 2047   MONOABS 0.8 09/05/2014 1058   EOSABS 0.3 09/14/2015 2047   EOSABS 0.6 09/05/2014 1058   BASOSABS 0.1 09/14/2015 2047   BASOSABS 0.1 09/05/2014 1058   Comprehensive Metabolic Panel:    Component Value Date/Time   NA 131* 09/17/2015 0430   NA 141 09/05/2014 1058   K 3.7 09/17/2015 0430   K 3.7 09/05/2014 1058   CL 97* 09/17/2015 0430   CL 103 09/05/2014 1058   CO2 25 09/17/2015 0430   CO2 27 09/05/2014 1058   BUN 30* 09/17/2015 0430   BUN 21* 09/05/2014 1058   CREATININE 0.72 09/17/2015 0430   CREATININE 1.04 09/05/2014 1058   GLUCOSE 135* 09/17/2015 0430   GLUCOSE 87 09/05/2014 1058   CALCIUM 8.2* 09/17/2015 0430   CALCIUM 8.5 09/05/2014  1058   AST 29 09/14/2015 2047   AST 18 09/05/2014 1058   ALT 80* 09/14/2015 2047   ALT 29 09/05/2014 1058   ALKPHOS 121 09/14/2015 2047   ALKPHOS 114 09/05/2014 1058   BILITOT 0.6 09/14/2015 2047   BILITOT 0.6 09/05/2014 1058   PROT 5.7* 09/14/2015 2047   PROT 7.0 09/05/2014 1058   ALBUMIN 2.3* 09/18/2015 0530   ALBUMIN 3.5 09/05/2014 1058      More than 50% of the visit was spent in counseling/coordination of care: YES  Time Spent:  25 min

## 2015-09-22 NOTE — Progress Notes (Signed)
Occupational Therapy Treatment Patient Details Name: Jorge Maynard MRN: [id number] DOB: 23-Dec-1934 Today's Date: 09/22/2015    History of present illness Patient is a 81 y/o male that recently underwent L frontotemporal craniotomy at [facility] for tumor resection. He has previously undergone stereotatic radiation for lung mets. He developed some confusion and non-productive cough while at SNF.    OT comments  Pt seen while sitting up in chair with wife sitting bedside.  Pt with improved affect and participation today.  He was able to hold pen in R hand and sign his name legibly and copy the date with mild perseveration on 13 before moving onto 2017 but significant improvement in writing skills compared to yesterday.  Worked on select parts of [place] with 1/3 for animal identification and able to state letters to track on left side of page with reading glasses he borrowed from his wife (per wife's rec) with mod cues for automatic speech of 1-2-3 counting and A-B-Cs.  Assessed feeding skills with lunch tray since his wife stated that he did better using utensils without foam utensil holder.  He used his R hand with spoon for first half of lunch with good scooping and control for carrots, mashed potatoes and chopped beef with gravy and mushrooms.  Apraxia noted when picking up ice cream to drink and was able to state it was his ice cream, but continued to bring to his mouth even after cueing and needed ice cream cup removed and tea placed in front of plate with tactile cues to drink the tea and break from task.  His wife was attentive and asking why he could not say "thank you" as an automatic response which was deferred to SLP.  Pt to go to [facility] today for continued rehab.  Pt is making good progress with automatic tasks with ADLs.   Follow Up Recommendations  SNF    Equipment Recommendations       Recommendations for Other Services      Precautions / Restrictions  Precautions Precautions: Fall Restrictions Weight Bearing Restrictions: No       Mobility Bed Mobility                  Transfers                      Balance                                   ADL Overall ADL's : Needs assistance/impaired Eating/Feeding: Set up Eating/Feeding Details (indicate cue type and reason): Pt able to feed self with R hand using spoon with minimal assist (no foam utensil holder needed) for set up to remove lid off of tea and mod cues for reminders that ice cream was not his drink and where his tea was.  He responded with "I know this is my ice cream" when told the container was not his tea and you could not drink it, but was not able to put ice cream down and had to be taken away .  He is starting to use his left hand with right hand and switched from R to L to hold spoon half way through feeding.  General ADL Comments: Improved direction following today with brighter affect sitting up in chair wth wife present.  Wife stated he was able to brush his hair and started to brush his teeth on his own but then had a coughing attack and was not able to finish.        Vision                     Perception     Praxis      Cognition                             Extremity/Trunk Assessment               Exercises     Shoulder Instructions       General Comments      Pertinent Vitals/ Pain          Home Living                                          Prior Functioning/Environment              Frequency Min 1X/week     Progress Toward Goals  OT Goals(current goals can now be found in the care plan section)  Progress towards OT goals: Progressing toward goals  Acute Rehab OT Goals Patient Stated Goal: return home OT Goal Formulation: With patient/family Time For Goal Achievement: 10/05/15 Potential to Achieve Goals:  Good  Plan Discharge plan remains appropriate    Co-evaluation                 End of Session     Activity Tolerance Patient tolerated treatment well   Patient Left in chair;with call bell/phone within reach;with chair alarm set;with family/visitor present (wife Gerald Stabs bedside)   Nurse Communication          Time: 8016-5537 OT Time Calculation (min): 45 min  Charges: OT General Charges $OT Visit: 1 Procedure OT Treatments $Self Care/Home Management : 23-37 mins $Therapeutic Activity: 8-22 mins  Shenequa Howse 09/22/2015, 1:09 PM   Chrys Racer, OTR/L ascom [phone]

## 2015-09-22 NOTE — Clinical Social Work Note (Signed)
Pt is ready for discharge today to [facility]. [employer] Josem Kaufmann has been obtained. Facility has received discharge information and is ready to admit pt. Pt's wife is aware and agreeable to did RN will call report to room#342 at [phone]. [facility] will provide transportation. CSW is signing off as no further needs identified.   Darden Dates, MSW, LCSW  Clinical Social Worker [phone]

## 2015-09-22 NOTE — Progress Notes (Signed)
Physical Therapy Treatment Patient Details Name: Jorge Maynard MRN: [id number] DOB: 1934-12-13 Today's Date: 09/22/2015    History of Present Illness Patient is a 81 y/o male that recently underwent L frontotemporal craniotomy at [facility] for tumor resection. He has previously undergone stereotatic radiation for lung mets. He developed some confusion and non-productive cough while at SNF.     PT Comments    Continued improvement in alertness, communication and active use of R hemi-body (at least 3-/5) this date.  Able to maintain static sitting with close sup and progress to standing trials with mod assist +2 this date.  Constant facilitation from therapist for appropriate midline orientation/weight shift in both A/P and M/L planes with standing activities, but does begin to initiate self-righting at times. Also able to complete bed/chair transfer, SPT with mod/max assist +2, indep advancing each LE (when provided with appropriate weight shift during transfer) to assist with transfer as needed. May continue transfer training with emphasis on standing and SPT (more automatic movements); anticipate difficulty learning new movement strategies (scoot/squat pivot) due to persistent apraxia.   Follow Up Recommendations  SNF     Equipment Recommendations       Recommendations for Other Services Rehab consult     Precautions / Restrictions Precautions Precautions: Fall Restrictions Weight Bearing Restrictions: No    Mobility  Bed Mobility Overal bed mobility: Needs Assistance Bed Mobility: Supine to Sit     Supine to sit: Mod assist;Max assist     General bed mobility comments: improving active use/integration of R hemi-body during movement transition; spontaneously positioning R LE into hooklying and reaching for R grab bar with bilat UEs to initiate movement transition this date  Transfers Overall transfer level: Needs assistance   Transfers: Sit to/from Stand Sit to  Stand: Mod assist;+2 safety/equipment Stand pivot transfers: Mod assist;Max assist;+2 safety/equipment       General transfer comment: improved forward weight shift and midline orientation (in A/P plane) with bilat UEs positioned on elevated tray table anterior to patient  Ambulation/Gait             General Gait Details: Unsafe/unable   Stairs            Wheelchair Mobility    Modified Rankin (Stroke Patients Only)       Balance Overall balance assessment: Needs assistance Sitting-balance support: No upper extremity supported;Feet supported Sitting balance-Leahy Scale: Fair Sitting balance - Comments: close sup for static sitting balance; marked improvement in pushing behaviors     Standing balance-Leahy Scale: Poor                 High Level Balance Comments: constant manual faciltiation for pelvic position (posterior tilt) and anterior weight translation with all standing efforts    Cognition Arousal/Alertness: Awake/alert Behavior During Therapy: WFL for tasks assessed/performed (occasional spontaneous attempts at movement/standing without cuing from therapist) Overall Cognitive Status: Impaired/Different from baseline                      Exercises Other Exercises Other Exercises: Sit/stand x4 with mod assist +2 for safety--emphasis on postural alignment, weight shift (A/P and R/L plane) and overall postural extension.  Constant manual facilitation throughout, but improves from mod +2 to min/mod assist +1 with accommodation to position. Other Exercises: Progressed standing to include L LE forward/retro stepping with bilat UEs on tray table, mod assist +1-2 for safety.  Manual faciltiation at pelvic for weight shift and at R knee for stabilization (  to prevent buckling vs. recurvatum in loading) Other Exercises: SPT, bed/recliner, mod/max assis t+2 for weight shift, standing balance and overall safety.  With appropriate weight shift, patient able  to indep advance each LE appropriately 75% time. Other Exercises: Incontinent bladder (x3) and bowel (x1) during session; dep +2 for hygiene, clothing/linen change.  Patient unable to verbalize need to void.  Able to assist with UBD, mod assist, in seated position; noted improvement in active use of R UE during functional tasks.    General Comments        Pertinent Vitals/Pain Pain Assessment: Faces Pain Score: 0-No pain Pain Location: no clinical indicators of pain Pain Intervention(s): Limited activity within patient's tolerance;Monitored during session;Repositioned    Home Living                      Prior Function            PT Goals (current goals can now be found in the care plan section) Acute Rehab PT Goals Patient Stated Goal: return home PT Goal Formulation: Patient unable to participate in goal setting Time For Goal Achievement: 09/29/15 Potential to Achieve Goals: Fair Progress towards PT goals: Progressing toward goals    Frequency  Min 2X/week    PT Plan Current plan remains appropriate    Co-evaluation             End of Session Equipment Utilized During Treatment: Gait belt Activity Tolerance: Patient tolerated treatment well Patient left: in chair;with call bell/phone within reach;with chair alarm set     Time: 1430-1520 PT Time Calculation (min) (ACUTE ONLY): 50 min  Charges:  $Therapeutic Activity: 8-22 mins $Neuromuscular Re-education: 23-37 mins                    G Codes:      Pax Reasoner H. Owens Shark, PT, DPT, NCS 09/22/2015, 3:51 PM [phone]

## 2015-09-25 LAB — CULTURE, RESPIRATORY

## 2015-09-25 LAB — CULTURE, RESPIRATORY W GRAM STAIN

## 2015-10-07 ENCOUNTER — Encounter (HOSPITAL_COMMUNITY): Payer: Self-pay

## 2015-10-07 ENCOUNTER — Telehealth: Payer: Self-pay | Admitting: *Deleted

## 2015-10-07 MED ORDER — AMOXICILLIN 500 MG PO CAPS: 500.0000 mg | ORAL_CAPSULE | Freq: Three times a day (TID) | ORAL | Status: AC

## 2015-10-07 NOTE — Telephone Encounter (Signed)
Per VO Dr Oliva Bustard, Amoxicillin 500 mg e scribed to [facility] and message left on Toni's VM

## 2015-10-07 NOTE — Telephone Encounter (Signed)
They are ordering O2 for the home, but asking if Jorge Maynard wants to order something for the chest congestion. Also of note is that he now has swelling on one side of his head

## 2015-10-20 ENCOUNTER — Ambulatory Visit: Payer: Medicare PPO

## 2015-10-22 ENCOUNTER — Inpatient Hospital Stay: Admission: RE | Admit: 2015-10-22 | Payer: Medicare PPO | Source: Ambulatory Visit | Admitting: Radiation Oncology

## 2015-10-22 ENCOUNTER — Ambulatory Visit: Payer: Medicare PPO | Admitting: Radiation Oncology

## 2015-11-08 DEATH — deceased

## 2015-11-14 ENCOUNTER — Ambulatory Visit: Payer: Medicare PPO | Admitting: Radiation Oncology

## 2017-03-31 IMAGING — CT CT CHEST W/ CM
1 series · 15 of 33 positions shown, 19 images · IV contrast (omnipaque)
Comparison: CT chest 09/02/2014

CLINICAL DATA: Patient with history of sarcoma, potentially
metastatic. Status post partial right lung resection [DATE].

EXAM:
CT CHEST WITH CONTRAST
TECHNIQUE: Multidetector CT imaging of the chest was performed during
intravenous contrast administration.
CONTRAST:  75mL OMNIPAQUE IOHEXOL 300 MG/ML  SOLN

[Series 2: routine chest with · axial · 0.68mm/px · z∈[-596,-320]mm · 15 of 65 slices shown, 19 images]
[im 5/65  mediastinal]
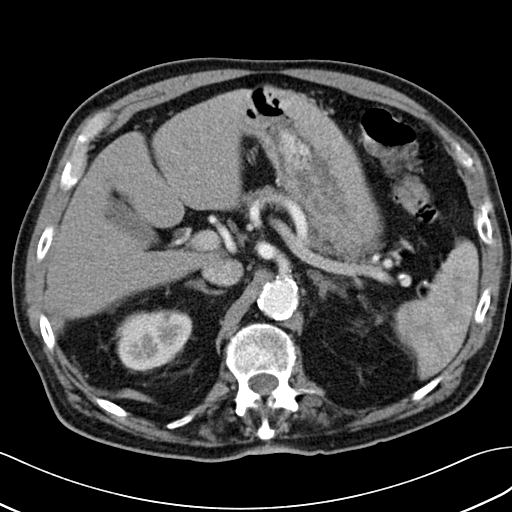
[im 5/65  lung]
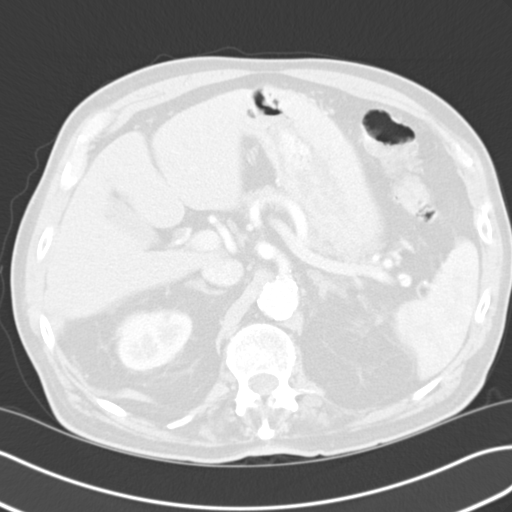
[im 10/65  lung]
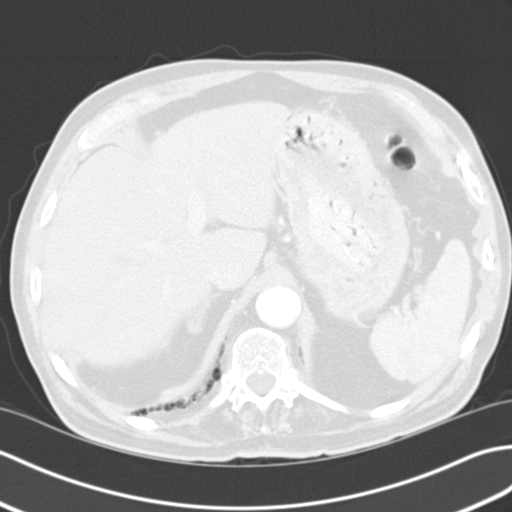
[im 13/65  lung]
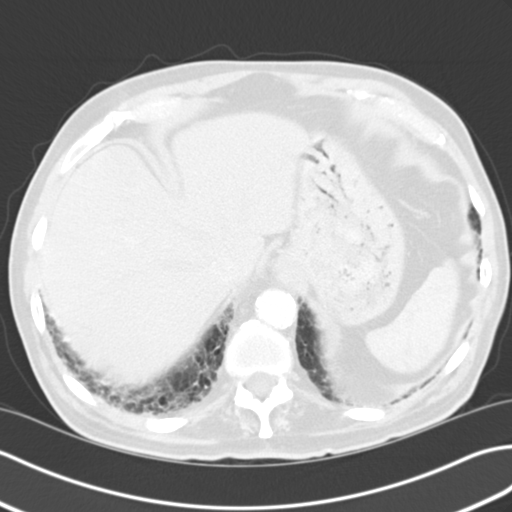
[im 17/65  lung]
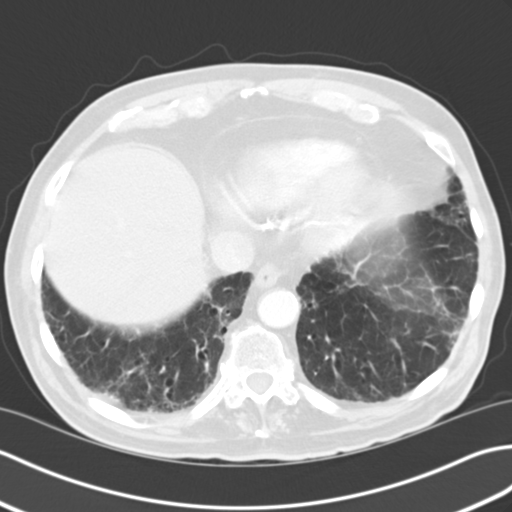
[im 22/65  mediastinal]
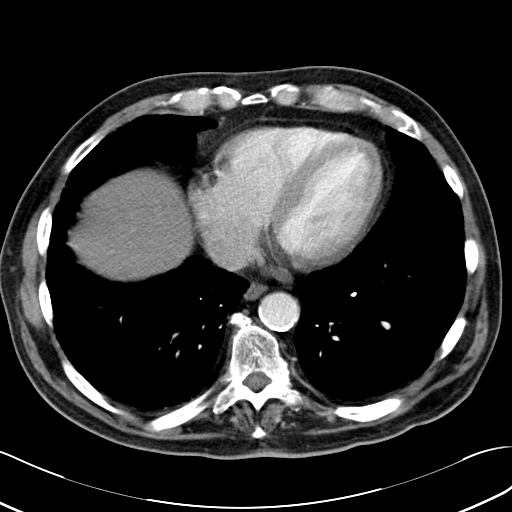
[im 22/65  lung]
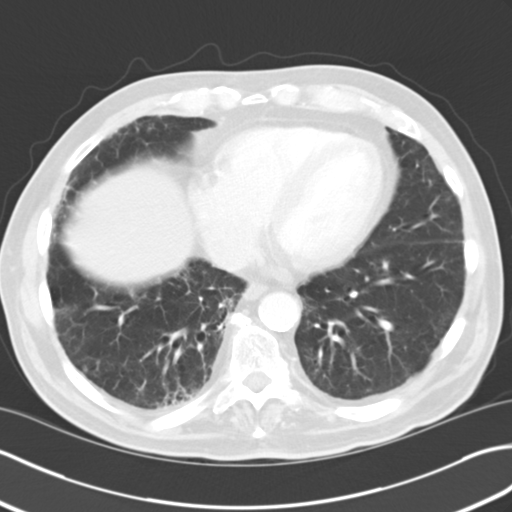
[im 26/65  lung]
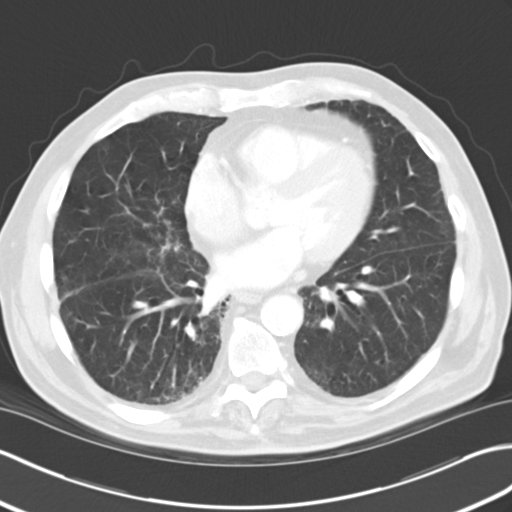
[im 29/65  lung]
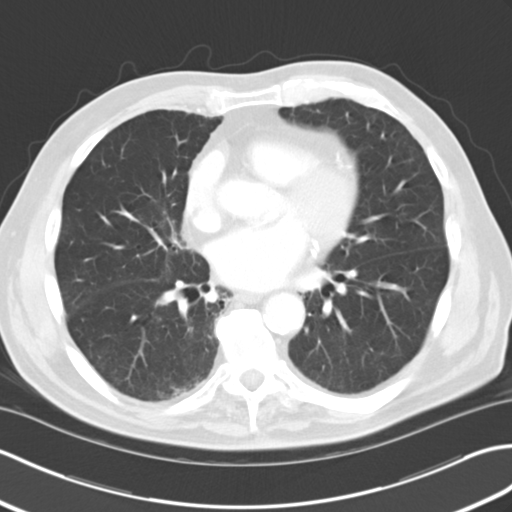
[im 34/65  lung]
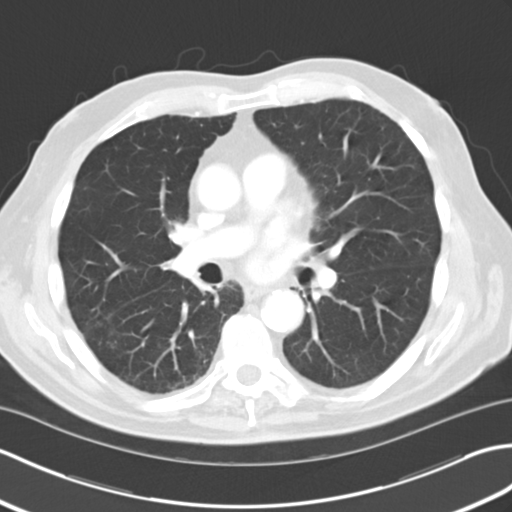
[im 36/65  mediastinal]
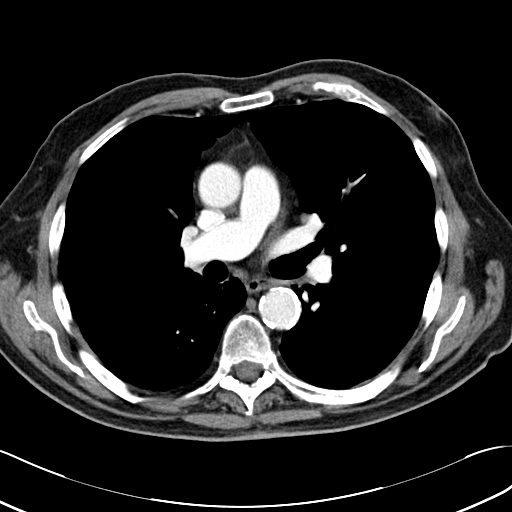
[im 36/65  lung]
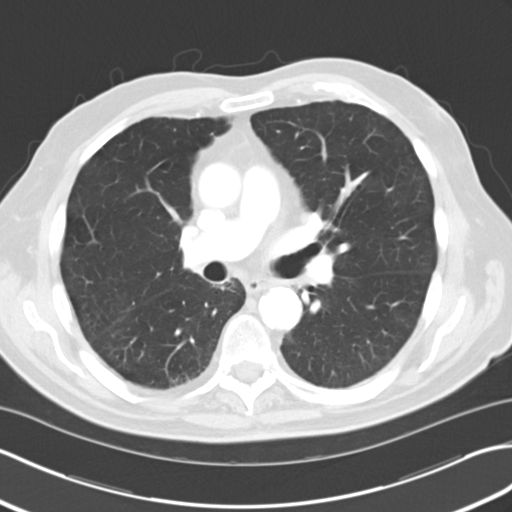
[im 39/65  lung]
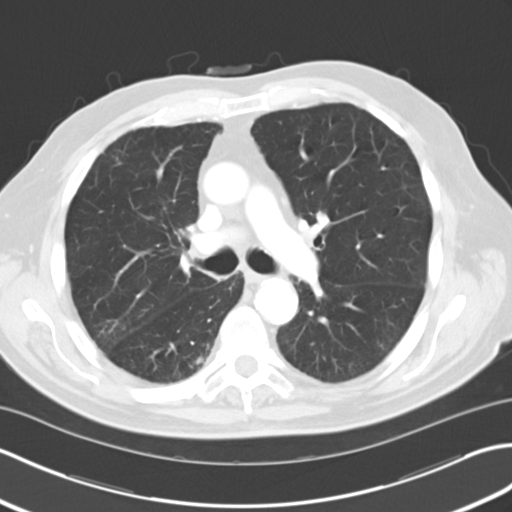
[im 43/65  lung]
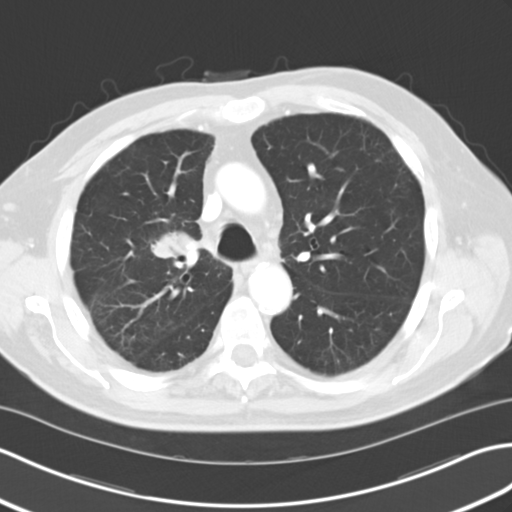
[im 48/65  lung]
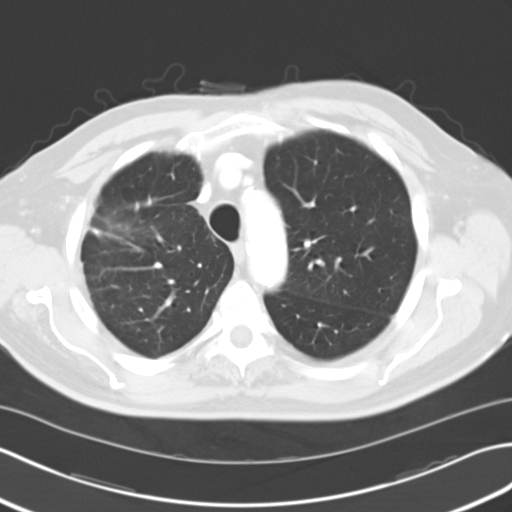
[im 52/65  mediastinal]
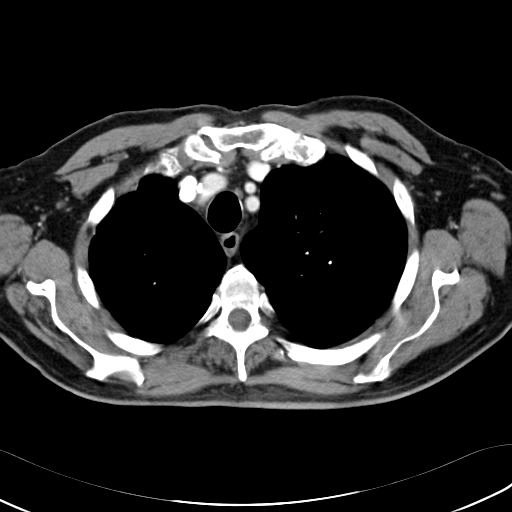
[im 52/65  lung]
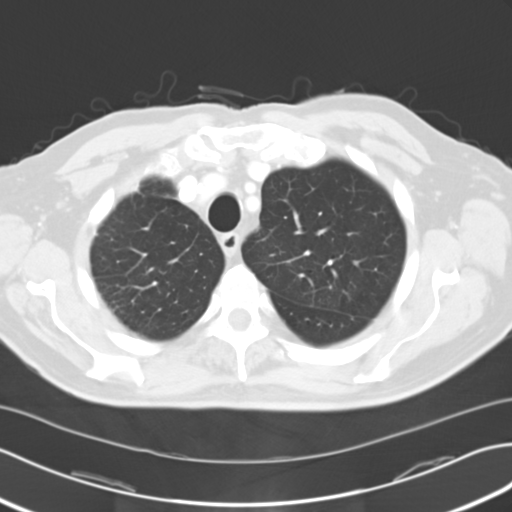
[im 55/65  lung]
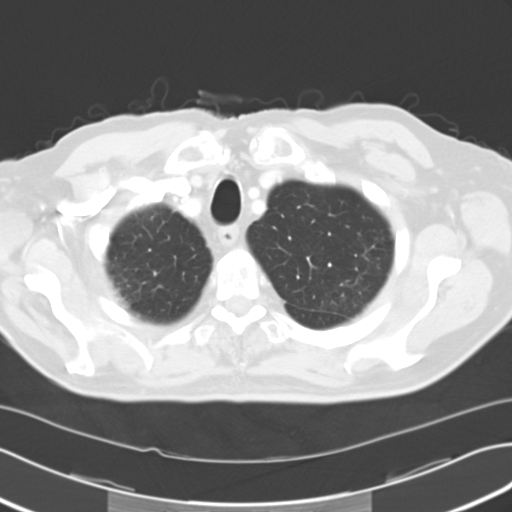
[im 60/65  lung]
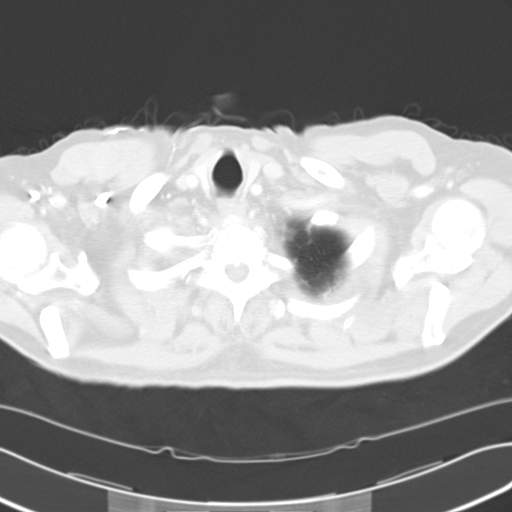

[15 of 33 positions shown; findings below may reference images not displayed]

FINDINGS: Mediastinum/Nodes: No enlarged axillary, mediastinal or hilar
lymphadenopathy. Multiple predominantly subcentimeter mediastinal
lymph nodes are stable. Trace fluid within the superior pericardial
recess. Normal heart size. Coronary arterial vascular
calcifications.

Lungs/Pleura: Stable fibrotic changes within the lower lobes
bilaterally. Central airways are patent. There is a new 2.0 x 2.4 cm
right upper lobe nodule (image 22; series 2). No pleural effusion or
pneumothorax. Postsurgical changes compatible with partial right
upper lobectomy. Slight interval increase in size of 10 mm density
along the right major fissure, previously measuring 7 mm (image 34;
series 3). Interval increase in size of subpleural right lower lobe
nodule measuring 6 mm, previously 5 mm (image 27; series 3).

Upper abdomen: Stable nodular thickening lateral limb left adrenal
gland. Stable nodularity medial limb right adrenal gland.

Musculoskeletal: Thoracic and lumbar spine degenerative changes. No
aggressive or acute appearing osseous lesions.
IMPRESSION: Interval development of a right upper lobe nodule measuring up to
2.4 cm, most compatible with metastatic disease. Postsurgical
changes compatible with partial right upper lobectomy.

Slight interval increase in size of right lower lobe subpleural
nodules.

Stable subcentimeter mediastinal lymph nodes.

Grossly unchanged bilateral lower lobe scarring.
